# Patient Record
Sex: Female | Born: 1945
Health system: Southern US, Community
[De-identification: ages and names within clinical notes are randomized; demographics above are authoritative.]

## PROBLEM LIST (undated history)

## (undated) DIAGNOSIS — I219 Acute myocardial infarction, unspecified: Secondary | ICD-10-CM

## (undated) DIAGNOSIS — E785 Hyperlipidemia, unspecified: Secondary | ICD-10-CM

## (undated) DIAGNOSIS — Z951 Presence of aortocoronary bypass graft: Secondary | ICD-10-CM

## (undated) DIAGNOSIS — R011 Cardiac murmur, unspecified: Secondary | ICD-10-CM

## (undated) DIAGNOSIS — I2510 Atherosclerotic heart disease of native coronary artery without angina pectoris: Secondary | ICD-10-CM

## (undated) DIAGNOSIS — M1990 Unspecified osteoarthritis, unspecified site: Secondary | ICD-10-CM

## (undated) DIAGNOSIS — E039 Hypothyroidism, unspecified: Secondary | ICD-10-CM

## (undated) DIAGNOSIS — I509 Heart failure, unspecified: Secondary | ICD-10-CM

## (undated) DIAGNOSIS — N814 Uterovaginal prolapse, unspecified: Secondary | ICD-10-CM

## (undated) DIAGNOSIS — N816 Rectocele: Secondary | ICD-10-CM

## (undated) DIAGNOSIS — I10 Essential (primary) hypertension: Secondary | ICD-10-CM

## (undated) HISTORY — DX: Rectocele: N81.6

## (undated) HISTORY — DX: Acute myocardial infarction, unspecified: I21.9

## (undated) HISTORY — DX: Hyperlipidemia, unspecified: E78.5

## (undated) HISTORY — DX: Heart failure, unspecified: I50.9

## (undated) HISTORY — PX: OTHER SURGICAL HISTORY: SHX169

## (undated) HISTORY — PX: TONSILLECTOMY: SUR1361

## (undated) HISTORY — DX: Uterovaginal prolapse, unspecified: N81.4

## (undated) HISTORY — DX: Presence of aortocoronary bypass graft: Z95.1

## (undated) HISTORY — PX: JOINT REPLACEMENT: SHX530

## (undated) HISTORY — PX: TUBAL LIGATION: SHX77

## (undated) HISTORY — PX: CORONARY ARTERY BYPASS GRAFT: SHX141

## (undated) HISTORY — PX: CARDIAC VALVE REPLACEMENT: SHX585

---

## 2013-08-27 HISTORY — PX: CARDIAC CATHETERIZATION: SHX172

## 2013-09-13 HISTORY — PX: CORONARY ARTERY BYPASS GRAFT: SHX141

## 2013-09-13 HISTORY — PX: CARDIAC VALVE REPLACEMENT: SHX585

## 2013-10-02 DIAGNOSIS — Q2381 Bicuspid aortic valve: Secondary | ICD-10-CM

## 2013-10-02 HISTORY — DX: Bicuspid aortic valve: Q23.81

## 2013-10-05 DIAGNOSIS — J9691 Respiratory failure, unspecified with hypoxia: Secondary | ICD-10-CM | POA: Insufficient documentation

## 2013-11-10 DIAGNOSIS — I10 Essential (primary) hypertension: Secondary | ICD-10-CM | POA: Insufficient documentation

## 2019-03-26 DIAGNOSIS — N816 Rectocele: Secondary | ICD-10-CM | POA: Insufficient documentation

## 2019-03-26 DIAGNOSIS — N814 Uterovaginal prolapse, unspecified: Secondary | ICD-10-CM | POA: Insufficient documentation

## 2019-03-26 DIAGNOSIS — N3281 Overactive bladder: Secondary | ICD-10-CM | POA: Insufficient documentation

## 2019-03-26 DIAGNOSIS — N950 Postmenopausal bleeding: Secondary | ICD-10-CM | POA: Insufficient documentation

## 2019-03-26 DIAGNOSIS — N8111 Cystocele, midline: Secondary | ICD-10-CM | POA: Insufficient documentation

## 2020-03-19 ENCOUNTER — Other Ambulatory Visit: Payer: Self-pay

## 2020-03-19 ENCOUNTER — Emergency Department
Admission: EM | Admit: 2020-03-19 | Discharge: 2020-03-19 | Disposition: A | Discharge status: Home / Self Care | Payer: Self-pay | Source: Home / Self Care

## 2020-03-19 DIAGNOSIS — I10 Essential (primary) hypertension: Secondary | ICD-10-CM

## 2020-03-19 DIAGNOSIS — R519 Headache, unspecified: Secondary | ICD-10-CM

## 2020-03-19 MED ORDER — AMLODIPINE BESYLATE 5 MG PO TABS: 5.0000 mg | ORAL_TABLET | Freq: Every day | ORAL | 0 refills | Status: DC

## 2020-03-19 NOTE — ED Triage Notes (Signed)
Patient presents to Urgent Care with complaints of headache intermittently since a week ago. Patient reports her blood pressure was high earlier today... 187/84, 175/85 at home. Pt thinks maybe it is her blood pressure making her head hurt, had a stress test last year and she passed (had it for surgery).

## 2020-03-19 NOTE — ED Provider Notes (Signed)
[facility]    CSN: [id number] Arrival date & time: 03/19/20  1849      History   Chief Complaint Chief Complaint  Patient presents with  . Headache    HPI Alicia Herrera is a 74 y.o. female.   HPI  Alicia Herrera is a 74 y.o. female presenting to UC with c/o intermittent HA that started 1 week ago.  Pain is 3/10, generalized HA right now. She reports taking her BP today, it was 187/84 and 175/85 at home.  Pt has a hx of HTN per her medical records but denies being on any BP medication.  Amlodipine and metoprolol are listed in the pt's chart via [facility] as recent as 05/2019.  Pt does not recall the last time she took this medication.  Pt reports completing a stress test last year with her cardiologist and passed.  She does note her husband died recently and has not checked her BP in months until today.  She does not have a PCP.  Denies dizziness, nausea, weakness, or numbness in arms or legs. No cough, congestion, sore throat or other URI symptoms.  No hx of migraines.   History reviewed. No pertinent past medical history.  There are no problems to display for this patient.   History reviewed. No pertinent surgical history.  OB History   No obstetric history on file.      Home Medications    Prior to Admission medications   Medication Sig Start Date End Date Taking? Authorizing Provider  rosuvastatin (CRESTOR) 10 MG tablet Take 20 mg by mouth daily.    Yes [provider]  amLODipine (NORVASC) 5 MG tablet Take 1 tablet (5 mg total) by mouth daily. 03/19/20   Lurene Shadow, PA-C    Family History Family History  Problem Relation Age of Onset  . Cancer Father     Social History Social History   Tobacco Use  . Smoking status: Never Smoker  . Smokeless tobacco: Never Used  Substance Use Topics  . Alcohol use: Not Currently  . Drug use: Not on file     Allergies   Patient has no known allergies.   Review of Systems Review of  Systems  Constitutional: Negative for chills and fever.  HENT: Negative for congestion, ear pain, sore throat, trouble swallowing and voice change.   Respiratory: Negative for cough and shortness of breath.   Cardiovascular: Negative for chest pain and palpitations.  Gastrointestinal: Negative for abdominal pain, diarrhea, nausea and vomiting.  Musculoskeletal: Negative for arthralgias, back pain and myalgias.  Skin: Negative for rash.  Neurological: Positive for headaches. Negative for dizziness, facial asymmetry, speech difficulty and light-headedness.  All other systems reviewed and are negative.    Physical Exam Triage Vital Signs ED Triage Vitals  Enc Vitals Group     BP 03/19/20 1911 (!) 152/103     Pulse Rate 03/19/20 1911 91     Resp 03/19/20 1911 16     Temp 03/19/20 1911 98.3 F (36.8 C)     Temp src --      SpO2 03/19/20 1911 100 %     Weight --      Height --      Head Circumference --      Peak Flow --      Pain Score 03/19/20 1907 3     Pain Loc --      Pain Edu? --      Excl. in GC? --  No data found.  Updated Vital Signs BP (!) 152/103 (BP Location: Left Arm)   Pulse 91   Temp 98.3 F (36.8 C)   Resp 16   SpO2 100%   Visual Acuity Right Eye Distance:   Left Eye Distance:   Bilateral Distance:    Right Eye Near:   Left Eye Near:    Bilateral Near:     Physical Exam Vitals and nursing note reviewed.  Constitutional:      Appearance: She is well-developed.  HENT:     Head: Normocephalic and atraumatic.     Right Ear: Tympanic membrane and ear canal normal.     Left Ear: Tympanic membrane and ear canal normal.     Nose: Nose normal.     Mouth/Throat:     Lips: Pink.     Mouth: Mucous membranes are moist.     Pharynx: Oropharynx is clear. Uvula midline.  Eyes:     General: No visual field deficit.    Extraocular Movements: Extraocular movements intact.     Pupils: Pupils are equal, round, and reactive to light.  Cardiovascular:      Rate and Rhythm: Normal rate and regular rhythm.  Pulmonary:     Effort: Pulmonary effort is normal. No respiratory distress.     Breath sounds: Normal breath sounds.  Musculoskeletal:        General: Normal range of motion.     Cervical back: Normal range of motion and neck supple.  Skin:    General: Skin is warm and dry.  Neurological:     Mental Status: She is alert and oriented to person, place, and time.     GCS: GCS eye subscore is 4. GCS verbal subscore is 5. GCS motor subscore is 6.     Cranial Nerves: No cranial nerve deficit, dysarthria or facial asymmetry.     Sensory: No sensory deficit.     Motor: No weakness.     Coordination: Romberg sign negative. Coordination normal.     Gait: Gait normal.  Psychiatric:        Mood and Affect: Mood normal.        Behavior: Behavior normal.      UC Treatments / Results  Labs (all labs ordered are listed, but only abnormal results are displayed) Labs Reviewed - No data to display  EKG   Radiology No results found.  Procedures Procedures (including critical care time)  Medications Ordered in UC Medications - No data to display  Initial Impression / Assessment and Plan / UC Course  I have reviewed the triage vital signs and the nursing notes.  Pertinent labs & imaging results that were available during my care of the patient were reviewed by me and considered in my medical decision making (see chart for details).     Pt appears well, NAD.  Doubt SAH or CVA Reviewed medical records, pt was at one point prescribed amlodipine 5mg  daily and metoprolol 25mg  daily (last listed in her cardiology records 05/26/2019) but pt does not recall when she was last prescribed these medications. Will restart pt on amlodipine 5mg  first Encouraged f/u with cardiology and PCP, she may need the metoprolol as well, do not want to lower BP too aggressively in the urgent care setting.  Discussed symptoms that warrant emergent care in the  ED. AVS provided  Final Clinical Impressions(s) / UC Diagnoses   Final diagnoses:  Bad headache  Uncontrolled hypertension     Discharge Instructions  It looks like you do have a history of high blood pressure. Your medical records indicate you were prescribed amlodipine 5mg  once daily and metorpolol 25mg  daily to help with your blood pressure. Because you have been off of both blood pressure medications for an unknown amount of time, I am just starting you on the low dose amlodipine today. It is highly recommended you establish care with a primary care provider (family doctor) for ongoing management of your blood pressure. You may need to be restarted on the second medication as well. It is also recommended you follow up with your cardiologist to discuss these medications.  Call 911 or go to the hospital if your headache worsens, you develop vomiting, change in vision, confusion, one sided weakness or numbness, or any other new concerning symptoms develop.     ED Prescriptions    Medication Sig Dispense Auth. Provider   amLODipine (NORVASC) 5 MG tablet  (Status: Discontinued) Take 1 tablet (5 mg total) by mouth daily. 30 tablet Gerarda Fraction, Melisia Leming O, PA-C   amLODipine (NORVASC) 5 MG tablet Take 1 tablet (5 mg total) by mouth daily. 30 tablet Noe Gens, [place]     PDMP not reviewed this encounter.   Noe Gens, [place] 03/22/20 [phone]

## 2020-03-19 NOTE — Discharge Instructions (Addendum)
  It looks like you do have a history of high blood pressure. Your medical records indicate you were prescribed amlodipine 5mg  once daily and metorpolol 25mg  daily to help with your blood pressure. Because you have been off of both blood pressure medications for an unknown amount of time, I am just starting you on the low dose amlodipine today. It is highly recommended you establish care with a primary care provider (family doctor) for ongoing management of your blood pressure. You may need to be restarted on the second medication as well. It is also recommended you follow up with your cardiologist to discuss these medications.  Call 911 or go to the hospital if your headache worsens, you develop vomiting, change in vision, confusion, one sided weakness or numbness, or any other new concerning symptoms develop.

## 2020-04-06 ENCOUNTER — Ambulatory Visit (INDEPENDENT_AMBULATORY_CARE_PROVIDER_SITE_OTHER): Payer: Self-pay | Admitting: Family Medicine

## 2020-04-06 ENCOUNTER — Encounter: Payer: Self-pay | Admitting: Family Medicine

## 2020-04-06 VITALS — BP 153/75 | HR 74 | Temp 98.0°F | Ht 67.0 in | Wt 184.5 lb

## 2020-04-06 DIAGNOSIS — I25810 Atherosclerosis of coronary artery bypass graft(s) without angina pectoris: Secondary | ICD-10-CM

## 2020-04-06 DIAGNOSIS — Z951 Presence of aortocoronary bypass graft: Secondary | ICD-10-CM

## 2020-04-06 DIAGNOSIS — I10 Essential (primary) hypertension: Secondary | ICD-10-CM

## 2020-04-06 DIAGNOSIS — E782 Mixed hyperlipidemia: Secondary | ICD-10-CM

## 2020-04-06 MED ORDER — ATORVASTATIN CALCIUM 20 MG PO TABS: 20.0000 mg | ORAL_TABLET | Freq: Every day | ORAL | 3 refills | Status: DC

## 2020-04-06 MED ORDER — AMLODIPINE BESYLATE 10 MG PO TABS: 10.0000 mg | ORAL_TABLET | Freq: Every day | ORAL | 2 refills | Status: DC

## 2020-04-06 NOTE — Progress Notes (Signed)
Alicia Herrera - 73 y.o. female MRN [id number]  Date of birth: 1946-02-01  Subjective Chief Complaint  Patient presents with  . Establish Care    HPI Alicia Herrera is a 74 y.o. female with history of CAD s/p CABG, aortic stenosis s/p bioprosthetic valve, HTN and HLD here today for initial visit.    -CAD/AS:  Had AV replacement with CABG in 2014.  She has done well since that time.  She follows with cardiology annually.  She denies anginal symptoms, dizziness or shortness of breath. She is taking daily full strength asa.   -HTN:  She has been prescribed amlodipine and metoprolol previously but does not believe that she ever took either of these.  She was seen at urgent care a couple of weeks ago for elevated BP and headache.  Amlodipine 5mg  started at that time.  BP and headaches have improved but still running a little high.    -HLD:  She was prescribed crestor but unable to tolerate due to myalgias.  She has not been on anything else in the past.   ROS:  A comprehensive ROS was completed and negative except as noted per HPI  No Known Allergies  No past medical history on file.  No past surgical history on file.  Social History   Socioeconomic History  . Marital status: Widowed    Spouse name: Not on file  . Number of children: Not on file  . Years of education: Not on file  . Highest education level: Not on file  Occupational History  . Not on file  Tobacco Use  . Smoking status: Never Smoker  . Smokeless tobacco: Never Used  Substance and Sexual Activity  . Alcohol use: Not Currently  . Drug use: Not on file  . Sexual activity: Not on file  Other Topics Concern  . Not on file  Social History Narrative  . Not on file   Social Determinants of Health   Financial Resource Strain:   . Difficulty of Paying Living Expenses:   Food Insecurity:   . Worried About in the Last Year:   . Programme researcher, broadcasting/film/video in the Last Year:   Transportation Needs:   . Barista (Medical):   Sales promotion account executive Lack of Transportation (Non-Medical):   Physical Activity:   . Days of Exercise per Week:   . Minutes of Exercise per Session:   Stress:   . Feeling of Stress :   Social Connections:   . Frequency of Communication with Friends and Family:   . Frequency of Social Gatherings with Friends and Family:   . Attends Religious Services:   . Active Member of Clubs or Organizations:   . Attends [employer] Meetings:   Banker Marital Status:     Family History  Problem Relation Age of Onset  . Cancer Father     Health Maintenance  Topic Date Due  . Hepatitis C Screening  Never done  . COVID-19 Vaccine (1) Never done  . TETANUS/TDAP  Never done  . COLONOSCOPY  Never done  . DEXA SCAN  Never done  . PNA vac Low Risk Adult (1 of 2 - PCV13) Never done  . INFLUENZA VACCINE  06/13/2020     ----------------------------------------------------------------------------------------------------------------------------------------------------------------------------------------------------------------- Physical Exam There were no vitals taken for this visit.  Physical Exam Constitutional:      Appearance: Normal appearance.  HENT:     Head: Normocephalic and atraumatic.  Eyes:  General: No scleral icterus. Cardiovascular:     Rate and Rhythm: Normal rate and regular rhythm.  Pulmonary:     Effort: Pulmonary effort is normal.     Breath sounds: Normal breath sounds.  Musculoskeletal:     Cervical back: Neck supple.  Neurological:     General: No focal deficit present.     Mental Status: She is alert.  Psychiatric:        Mood and Affect: Mood normal.        Behavior: Behavior normal.     ------------------------------------------------------------------------------------------------------------------------------------------------------------------------------------------------------------------- Assessment and Plan  Essential  hypertension Blood pressure is not at goal at for age and co-morbidities.  I recommend increase of amlodipine to 10mg  daily.  In addition they were instructed to follow a low sodium diet with regular exercise to help to maintain adequate control of blood pressure.    Hyperlipidemia She hasn't tolerated crestor well Change to lipitor to see if this is better tolerated.   Updated lipid and CMP ordered.   S/P CABG (coronary artery bypass graft) Stable at this time without anginal symptoms.  Continue daily ASA Keep bp under good control.  Has f/u with cardiology in July.    Meds ordered this encounter  Medications  . amLODipine (NORVASC) 10 MG tablet    Sig: Take 1 tablet (10 mg total) by mouth daily.    Dispense:  90 tablet    Refill:  2  . atorvastatin (LIPITOR) 20 MG tablet    Sig: Take 1 tablet (20 mg total) by mouth daily.    Dispense:  90 tablet    Refill:  3    Return in about 6 weeks (around 05/18/2020).    This visit occurred during the SARS-CoV-2 public health emergency.  Safety protocols were in place, including screening questions prior to the visit, additional usage of staff PPE, and extensive cleaning of exam room while observing appropriate contact time as indicated for disinfecting solutions.

## 2020-04-06 NOTE — Assessment & Plan Note (Signed)
Stable at this time without anginal symptoms.  Continue daily ASA Keep bp under good control.  Has f/u with cardiology in July.

## 2020-04-06 NOTE — Assessment & Plan Note (Signed)
She hasn't tolerated crestor well Change to lipitor to see if this is better tolerated.   Updated lipid and CMP ordered.

## 2020-04-06 NOTE — Patient Instructions (Addendum)
Great to meet you today! Lets try lipitor (Atorvastatin) to replace crestor (rosuvastatin) Increase amlodipine to 10mg  daily and continue to monitor BP at home.  See me again in about 6 weeks for blood pressure follow up.

## 2020-04-06 NOTE — Assessment & Plan Note (Signed)
Blood pressure is not at goal at for age and co-morbidities.  I recommend increase of amlodipine to 10mg daily.  In addition they were instructed to follow a low sodium diet with regular exercise to help to maintain adequate control of blood pressure.   

## 2020-05-14 LAB — COMPLETE METABOLIC PANEL WITH GFR
AG Ratio: 1.7 (calc) (ref 1.0–2.5)
ALT: 15 U/L (ref 6–29)
AST: 14 U/L (ref 10–35)
Albumin: 4.3 g/dL (ref 3.6–5.1)
Alkaline phosphatase (APISO): 61 U/L (ref 37–153)
BUN: 16 mg/dL (ref 7–25)
CO2: 28 mmol/L (ref 20–32)
Calcium: 9.2 mg/dL (ref 8.6–10.4)
Chloride: 105 mmol/L (ref 98–110)
Creat: 0.72 mg/dL (ref 0.60–0.93)
GFR, Est African American: 95 mL/min/{1.73_m2} (ref >=60)
GFR, Est Non African American: 82 mL/min/{1.73_m2} (ref >=60)
Globulin: 2.5 g/dL (calc) (ref 1.9–3.7)
Glucose, Bld: 97 mg/dL (ref 65–99)
Potassium: 4 mmol/L (ref 3.5–5.3)
Sodium: 140 mmol/L (ref 135–146)
Total Bilirubin: 0.4 mg/dL (ref 0.2–1.2)
Total Protein: 6.8 g/dL (ref 6.1–8.1)

## 2020-05-14 LAB — LIPID PANEL
Cholesterol: 199 mg/dL (ref <200)
HDL: 45 mg/dL — ABNORMAL LOW (ref >=50)
LDL Cholesterol (Calc): 136 mg/dL (calc) — ABNORMAL HIGH
Non-HDL Cholesterol (Calc): 154 mg/dL (calc) — ABNORMAL HIGH (ref <130)
Total CHOL/HDL Ratio: 4.4 (calc) (ref <5.0)
Triglycerides: 85 mg/dL (ref <150)

## 2020-05-14 LAB — CBC
HCT: 36.8 % (ref 35.0–45.0)
Hemoglobin: 12.3 g/dL (ref 11.7–15.5)
MCH: 27.4 pg (ref 27.0–33.0)
MCHC: 33.4 g/dL (ref 32.0–36.0)
MCV: 82 fL (ref 80.0–100.0)
MPV: 9.4 fL (ref 7.5–12.5)
Platelets: 248 10*3/uL (ref 140–400)
RBC: 4.49 10*6/uL (ref 3.80–5.10)
RDW: 14.3 % (ref 11.0–15.0)
WBC: 4.5 10*3/uL (ref 3.8–10.8)

## 2020-05-18 ENCOUNTER — Ambulatory Visit (INDEPENDENT_AMBULATORY_CARE_PROVIDER_SITE_OTHER): Payer: Medicare Other

## 2020-05-18 ENCOUNTER — Ambulatory Visit (INDEPENDENT_AMBULATORY_CARE_PROVIDER_SITE_OTHER): Payer: Medicare Other | Admitting: Family Medicine

## 2020-05-18 ENCOUNTER — Encounter: Payer: Self-pay | Admitting: Family Medicine

## 2020-05-18 ENCOUNTER — Other Ambulatory Visit: Payer: Self-pay

## 2020-05-18 VITALS — BP 129/64 | HR 73 | Ht 66.93 in | Wt 183.2 lb

## 2020-05-18 DIAGNOSIS — G8929 Other chronic pain: Secondary | ICD-10-CM

## 2020-05-18 DIAGNOSIS — M25562 Pain in left knee: Secondary | ICD-10-CM | POA: Insufficient documentation

## 2020-05-18 DIAGNOSIS — E782 Mixed hyperlipidemia: Secondary | ICD-10-CM | POA: Diagnosis not present

## 2020-05-18 DIAGNOSIS — I10 Essential (primary) hypertension: Secondary | ICD-10-CM

## 2020-05-18 DIAGNOSIS — M25561 Pain in right knee: Secondary | ICD-10-CM

## 2020-05-18 DIAGNOSIS — M170 Bilateral primary osteoarthritis of knee: Secondary | ICD-10-CM | POA: Insufficient documentation

## 2020-05-18 NOTE — Assessment & Plan Note (Signed)
Tolerating atorvastatin, f/u lipids in ~6 months.

## 2020-05-18 NOTE — Patient Instructions (Signed)
Nice to see you today! Continue voltaren gel to knees.  You may use this up to 4 times per day Have xrays completed, we'll be in touch with results.  Follow up with me in 6 months.

## 2020-05-18 NOTE — Progress Notes (Addendum)
Alicia Herrera - 74 y.o. female MRN [id number]  Date of birth: 07-31-1946  Subjective Chief Complaint  Patient presents with  . Hypertension  . Knee Pain    HPI Alicia Herrera is a 74 y.o. female with history of CAD s/p CABG, HTN, and HLD here today for follow up visit.   At her most recent visit she was started on amlodipine 10mg  daily.  She is doing well with this.  Home BP readings have trended down since starting this.  BP today is well controlled.  She denies any symptoms related to HTN including chest pain, shortness of breath, palpitations, headache or vision changes.   She is also tolerating atorvastatin well.  Denies myalgias with this.   She is having some pain in bilateral knees. This is chronic with some worsening recently.  The is swelling in medial knees bilaterally.  Some pain behind knee caps as well.  She denies locking, popping or feeling of instability.  She was seen by chiropractor previously who told her that her knee pain was coming from her back.   ROS:  A comprehensive ROS was completed and negative except as noted per HPI  No Known Allergies  Past Medical History:  Diagnosis Date  . Hx of CABG   . Rectocele   . Uterine prolapse     Past Surgical History:  Procedure Laterality Date  . CARDIAC VALVE REPLACEMENT    . CORONARY ARTERY BYPASS GRAFT      Social History   Socioeconomic History  . Marital status: Widowed    Spouse name: Not on file  . Number of children: Not on file  . Years of education: Not on file  . Highest education level: Not on file  Occupational History  . Occupation: Retired  Tobacco Use  . Smoking status: Never Smoker  . Smokeless tobacco: Never Used  Vaping Use  . Vaping Use: Never used  Substance and Sexual Activity  . Alcohol use: Yes    Alcohol/week: 1.0 standard drink    Types: 1 Glasses of wine per week    Comment: Occasionally  . Drug use: Never  . Sexual activity: Not Currently    Birth control/protection:  Abstinence  Other Topics Concern  . Not on file  Social History Narrative  . Not on file   Social Determinants of Health   Financial Resource Strain:   . Difficulty of Paying Living Expenses:   Food Insecurity:   . Worried About in the Last Year:   . Programme researcher, broadcasting/film/video in the Last Year:   Transportation Needs:   . Barista (Medical):   Freight forwarder Lack of Transportation (Non-Medical):   Physical Activity:   . Days of Exercise per Week:   . Minutes of Exercise per Session:   Stress:   . Feeling of Stress :   Social Connections:   . Frequency of Communication with Friends and Family:   . Frequency of Social Gatherings with Friends and Family:   . Attends Religious Services:   . Active Member of Clubs or Organizations:   . Attends [employer] Meetings:   Banker Marital Status:     Family History  Problem Relation Age of Onset  . Hypertension Mother   . Heart attack Mother   . Cancer Father   . Penile cancer Father   . Diabetes Sister     Health Maintenance  Topic Date Due  . Hepatitis C Screening  Never done  .  TETANUS/TDAP  Never done  . COLONOSCOPY  Never done  . DEXA SCAN  Never done  . PNA vac Low Risk Adult (1 of 2 - PCV13) Never done  . INFLUENZA VACCINE  06/13/2020  . COVID-19 Vaccine  Completed     ----------------------------------------------------------------------------------------------------------------------------------------------------------------------------------------------------------------- Physical Exam BP 129/64 (BP Location: Left Arm, Patient Position: Sitting, Cuff Size: Large)   Pulse 73   Ht 5' 6.93" (1.7 m)   Wt 183 lb 3.2 oz (83.1 kg)   SpO2 97%   BMI 28.75 kg/m   Physical Exam Constitutional:      Appearance: Normal appearance.  HENT:     Head: Normocephalic and atraumatic.  Cardiovascular:     Rate and Rhythm: Normal rate and regular rhythm.  Pulmonary:     Effort: Pulmonary effort is normal.      Breath sounds: Normal breath sounds.  Musculoskeletal:     Cervical back: Neck supple.     Comments: Bilateral knees with swelling medially. There is some tenderness over the medial joint line as well.  Pain with patellar compression and some crepitus.    Skin:    General: Skin is warm and dry.  Neurological:     General: No focal deficit present.     Mental Status: She is alert.  Psychiatric:        Mood and Affect: Mood normal.        Behavior: Behavior normal.     ------------------------------------------------------------------------------------------------------------------------------------------------------------------------------------------------------------------- Assessment and Plan  Essential hypertension Blood pressure is at goal at for age and co-morbidities.  I recommend she continue amlodipine at current strength.  In addition they were instructed to follow a low sodium diet with regular exercise to help to maintain adequate control of blood pressure.    Hyperlipidemia Tolerating atorvastatin, f/u lipids in ~6 months.   Chronic pain of both knees Suspect medial compartment OA Bilateral knee pain and swelling. Xrays ordered today for evaluation of joint spaces.    No orders of the defined types were placed in this encounter.   Return in about 6 months (around 11/18/2020) for HTN/HLD.    This visit occurred during the SARS-CoV-2 public health emergency.  Safety protocols were in place, including screening questions prior to the visit, additional usage of staff PPE, and extensive cleaning of exam room while observing appropriate contact time as indicated for disinfecting solutions.

## 2020-05-18 NOTE — Assessment & Plan Note (Signed)
Blood pressure is at goal at for age and co-morbidities.  I recommend she continue amlodipine at current strength.  In addition they were instructed to follow a low sodium diet with regular exercise to help to maintain adequate control of blood pressure.

## 2020-05-18 NOTE — Assessment & Plan Note (Addendum)
Suspect medial compartment OA Bilateral knee pain and swelling. Xrays ordered today for evaluation of joint spaces.

## 2020-05-20 ENCOUNTER — Other Ambulatory Visit: Payer: Self-pay | Admitting: Family Medicine

## 2020-05-20 MED ORDER — MELOXICAM 7.5 MG PO TABS
7.5000 mg | ORAL_TABLET | Freq: Two times a day (BID) | ORAL | 1 refills | Status: DC | PRN
Start: 2020-05-20 — End: 2020-11-18

## 2020-11-18 ENCOUNTER — Other Ambulatory Visit: Payer: Self-pay

## 2020-11-18 ENCOUNTER — Encounter: Payer: Self-pay | Admitting: Family Medicine

## 2020-11-18 ENCOUNTER — Ambulatory Visit (INDEPENDENT_AMBULATORY_CARE_PROVIDER_SITE_OTHER): Payer: Medicare HMO | Admitting: Family Medicine

## 2020-11-18 VITALS — BP 113/70 | HR 77 | Ht 66.93 in | Wt 182.0 lb

## 2020-11-18 DIAGNOSIS — I10 Essential (primary) hypertension: Secondary | ICD-10-CM | POA: Diagnosis not present

## 2020-11-18 DIAGNOSIS — M25561 Pain in right knee: Secondary | ICD-10-CM

## 2020-11-18 DIAGNOSIS — G8929 Other chronic pain: Secondary | ICD-10-CM | POA: Diagnosis not present

## 2020-11-18 DIAGNOSIS — E782 Mixed hyperlipidemia: Secondary | ICD-10-CM | POA: Diagnosis not present

## 2020-11-18 DIAGNOSIS — Z951 Presence of aortocoronary bypass graft: Secondary | ICD-10-CM

## 2020-11-18 DIAGNOSIS — M25562 Pain in left knee: Secondary | ICD-10-CM | POA: Diagnosis not present

## 2020-11-18 MED ORDER — AMLODIPINE BESYLATE 10 MG PO TABS: 10.0000 mg | ORAL_TABLET | Freq: Every day | ORAL | 2 refills | Status: DC

## 2020-11-18 NOTE — Progress Notes (Signed)
Alicia Herrera - 75 y.o. female MRN [id number]  Date of birth: 1946/11/08  Subjective No chief complaint on file.   HPI Alicia Herrera is a 75 y.o. female with history of HTN, HLD, CAD here today for follow up visit.  She reports that she is doing well and has no new concerns today.  She has had COVID vaccine, has not had booster yet.   She is doing well with amlodipine.  She denies side effects related to medication.  She continues to see cardiology for her history of CABG and ascending aorta repair.    HLD is managed with atorvastatin.  She was taking crestor however we changed to atorvastatin because she was having some aches in her legs.  She reports that changing this didn't seem to make any difference.  Her cardiologist suggested that she increase the atorvastatin to 40mg  as her previous LDL was 136.  She did not do this because she had not been taking statin consistently and wanted to see if taking medication consistently would get her to goal before increasing.    ROS:  A comprehensive ROS was completed and negative except as noted per HPI  No Known Allergies  Past Medical History:  Diagnosis Date  . Hx of CABG   . Rectocele   . Uterine prolapse     Past Surgical History:  Procedure Laterality Date  . CARDIAC VALVE REPLACEMENT    . CORONARY ARTERY BYPASS GRAFT      Social History   Socioeconomic History  . Marital status: Widowed    Spouse name: Not on file  . Number of children: Not on file  . Years of education: Not on file  . Highest education level: Not on file  Occupational History  . Occupation: Retired  Tobacco Use  . Smoking status: Never Smoker  . Smokeless tobacco: Never Used  Vaping Use  . Vaping Use: Never used  Substance and Sexual Activity  . Alcohol use: Yes    Alcohol/week: 1.0 standard drink    Types: 1 Glasses of wine per week    Comment: Occasionally  . Drug use: Never  . Sexual activity: Not Currently    Birth control/protection:  Abstinence  Other Topics Concern  . Not on file  Social History Narrative  . Not on file   Social Determinants of Health   Financial Resource Strain: Not on file  Food Insecurity: Not on file  Transportation Needs: Not on file  Physical Activity: Not on file  Stress: Not on file  Social Connections: Not on file    Family History  Problem Relation Age of Onset  . Hypertension Mother   . Heart attack Mother   . Cancer Father   . Penile cancer Father   . Diabetes Sister     Health Maintenance  Topic Date Due  . Hepatitis C Screening  Never done  . TETANUS/TDAP  Never done  . COLONOSCOPY (Pts 45-22yrs Insurance coverage will need to be confirmed)  Never done  . DEXA SCAN  Never done  . PNA vac Low Risk Adult (1 of 2 - PCV13) Never done  . INFLUENZA VACCINE  Never done  . COVID-19 Vaccine (3 - Booster for Pfizer series) 08/14/2020     ----------------------------------------------------------------------------------------------------------------------------------------------------------------------------------------------------------------- Physical Exam BP 113/70   Pulse 77   Ht 5' 6.93" (1.7 m)   Wt 182 lb (82.6 kg)   BMI 28.56 kg/m   Physical Exam Constitutional:      Appearance: Normal appearance.  HENT:     Head: Normocephalic and atraumatic.  Eyes:     General: No scleral icterus. Cardiovascular:     Rate and Rhythm: Normal rate and regular rhythm.  Pulmonary:     Effort: Pulmonary effort is normal.     Breath sounds: Normal breath sounds.  Neurological:     General: No focal deficit present.     Mental Status: She is alert.  Psychiatric:        Mood and Affect: Mood normal.        Behavior: Behavior normal.     ------------------------------------------------------------------------------------------------------------------------------------------------------------------------------------------------------------------- Assessment and  Plan  Essential hypertension Blood pressure is at goal at for age and co-morbidities.  I recommend continuation of amlodipine at current strength.  In addition they were instructed to follow a low sodium diet with regular exercise to help to maintain adequate control of blood pressure.    Hyperlipidemia Goal LDL <70 She has started using statin more consistently and has not really notice improvement in joint/leg pain since changing from crestor.  Checking D-LDL today.  Discussed that if LDL remains elevated would recommend change back to crestor.     S/P CABG (coronary artery bypass graft) Followed by cardiology.  No anginal symptoms at this time.  Continue asa and statin.   Chronic pain of both knees No improvement with change in statin.  Better with chiropractor treatment.     Meds ordered this encounter  Medications  . amLODipine (NORVASC) 10 MG tablet    Sig: Take 1 tablet (10 mg total) by mouth daily.    Dispense:  90 tablet    Refill:  2    Return in about 6 months (around 05/18/2021) for HTN/HLD.    This visit occurred during the SARS-CoV-2 public health emergency.  Safety protocols were in place, including screening questions prior to the visit, additional usage of staff PPE, and extensive cleaning of exam room while observing appropriate contact time as indicated for disinfecting solutions.

## 2020-11-18 NOTE — Assessment & Plan Note (Signed)
Goal LDL <70 She has started using statin more consistently and has not really notice improvement in joint/leg pain since changing from crestor.  Checking D-LDL today.  Discussed that if LDL remains elevated would recommend change back to crestor.

## 2020-11-18 NOTE — Assessment & Plan Note (Signed)
Followed by cardiology.  No anginal symptoms at this time.  Continue asa and statin.

## 2020-11-18 NOTE — Assessment & Plan Note (Signed)
Blood pressure is at goal at for age and co-morbidities.  I recommend continuation of amlodipine at current strength.  In addition they were instructed to follow a low sodium diet with regular exercise to help to maintain adequate control of blood pressure.   

## 2020-11-18 NOTE — Assessment & Plan Note (Signed)
No improvement with change in statin.  Better with chiropractor treatment.

## 2020-11-18 NOTE — Patient Instructions (Signed)
Nice to see you today! Please continue amlodipine.  Have labs completed today.  See me again in 6 months.

## 2020-11-19 ENCOUNTER — Other Ambulatory Visit: Payer: Self-pay | Admitting: Family Medicine

## 2020-11-19 LAB — LDL CHOLESTEROL, DIRECT: Direct LDL: 174 mg/dL — ABNORMAL HIGH (ref <100)

## 2020-11-19 MED ORDER — ROSUVASTATIN CALCIUM 20 MG PO TABS
20.0000 mg | ORAL_TABLET | Freq: Every day | ORAL | 3 refills | Status: DC
Start: 2020-11-19 — End: 2021-05-18

## 2020-11-19 NOTE — Progress Notes (Signed)
LDL is quite high, recommend change back to crestor 20mg  daily.  Recheck in 6 months.  Updated rx sent in.

## 2021-01-17 ENCOUNTER — Telehealth: Payer: Medicare HMO

## 2021-01-17 ENCOUNTER — Ambulatory Visit (INDEPENDENT_AMBULATORY_CARE_PROVIDER_SITE_OTHER): Payer: Medicare HMO | Admitting: Family Medicine

## 2021-01-17 DIAGNOSIS — Z0000 Encounter for general adult medical examination without abnormal findings: Secondary | ICD-10-CM

## 2021-01-17 NOTE — Patient Instructions (Addendum)
Bone Density Test A bone density test uses a type of X-ray to measure the amount of calcium and other minerals in a person's bones. It can measure bone density in the hip and the spine. The test is similar to having a regular X-ray. This test may also be called:  Bone densitometry.  Bone mineral density test.  Dual-energy X-ray absorptiometry (DEXA). You may have this test to:  Diagnose a condition that causes weak or thin bones (osteoporosis).  Screen you for osteoporosis.  Predict your risk for a broken bone (fracture).  Determine how well your osteoporosis treatment is working. Tell a health care provider about:  Any allergies you have.  All medicines you are taking, including vitamins, herbs, eye drops, creams, and over-the-counter medicines.  Any problems you or family members have had with anesthetic medicines.  Any blood disorders you have.  Any surgeries you have had.  Any medical conditions you have.  Whether you are pregnant or may be pregnant.  Any medical tests you have had within the past 14 days that used contrast material. What are the risks? Generally, this is a safe test. However, it does expose you to a small amount of radiation, which can slightly increase your cancer risk. What happens before the test?  Do not take any calcium supplements within the 24 hours before your test.  You will need to remove all metal jewelry, eyeglasses, removable dental appliances, and any other metal objects on your body. What happens during the test?  You will lie down on an exam table. There will be an X-ray generator below you and an imaging device above you.  Other devices, such as boxes or braces, may be used to position your body properly for the scan.  The machine will slowly scan your body. You will need to keep very still while the machine does the scan.  The images will show up on a screen in the room. Images will be examined by a specialist after your  test is finished. The procedure may vary among health care providers and hospitals.   What can I expect after the test? It is up to you to get the results of your test. Ask your health care provider, or the department that is doing the test, when your results will be ready. Summary  A bone density test is an imaging test that uses a type of X-ray to measure the amount of calcium and other minerals in your bones.  The test may be used to diagnose or screen you for a condition that causes weak or thin bones (osteoporosis), predict your risk for a broken bone (fracture), or determine how well your osteoporosis treatment is working.  Do not take any calcium supplements within 24 hours before your test.  Ask your health care provider, or the department that is doing the test, when your results will be ready. This information is not intended to replace advice given to you by your health care provider. Make sure you discuss any questions you have with your health care provider. Document Revised: 04/15/2020 Document Reviewed: 04/15/2020 Elsevier Patient Education  2021 [employer].   Colonoscopy, Adult A colonoscopy is a procedure to look at the entire large intestine. This procedure is done using a long, thin, flexible tube that has a camera on the end. You may have a colonoscopy:  As a part of normal colorectal screening.  If you have certain symptoms, such as: ? A low number of red blood cells  in your blood (anemia). ? Diarrhea that does not go away. ? Pain in your abdomen. ? Blood in your stool. A colonoscopy can help screen for and diagnose medical problems, including:  Tumors.  Extra tissue that grows where mucus forms (polyps).  Inflammation.  Areas of bleeding. Tell your health care provider about:  Any allergies you have.  All medicines you are taking, including vitamins, herbs, eye drops, creams, and over-the-counter medicines.  Any problems you or family members have  had with anesthetic medicines.  Any blood disorders you have.  Any surgeries you have had.  Any medical conditions you have.  Any problems you have had with having bowel movements.  Whether you are pregnant or may be pregnant. What are the risks? Generally, this is a safe procedure. However, problems may occur, including:  Bleeding.  Damage to your intestine.  Allergic reactions to medicines given during the procedure.  Infection. This is rare. What happens before the procedure? Eating and drinking restrictions Follow instructions from your health care provider about eating or drinking restrictions, which may include:  A few days before the procedure: ? Follow a low-fiber diet. ? Avoid nuts, seeds, dried fruit, raw fruits, and vegetables.  1-3 days before the procedure: ? Eat only gelatin dessert or ice pops. ? Drink only clear liquids, such as water, clear juice, clear broth or bouillon, black coffee or tea, or clear soft drinks or sports drinks. ? Avoid liquids that contain red or purple dye.  The day of the procedure: ? Do not eat solid foods. You may continue to drink clear liquids until up to 2 hours before the procedure. ? Do not eat or drink anything starting 2 hours before the procedure, or within the time period that your health care provider recommends. Bowel prep If you were prescribed a bowel prep to take by mouth (orally) to clean out your colon:  Take it as told by your health care provider. Starting the day before your procedure, you will need to drink a large amount of liquid medicine. The liquid will cause you to have many bowel movements of loose stool until your stool becomes almost clear or light green.  If your skin or the opening between the buttocks (anus) gets irritated from diarrhea, you may relieve the irritation using: ? Wipes with medicine in them, such as adult wet wipes with aloe and vitamin E. ? A product to soothe skin, such as petroleum  jelly.  If you vomit while drinking the bowel prep: ? Take a break for up to 60 minutes. ? Begin the bowel prep again. ? Call your health care provider if you keep vomiting or you cannot take the bowel prep without vomiting.  To clean out your colon, you may also be given: ? Laxative medicines. These help you have a bowel movement. ? Instructions for enema use. An enema is liquid medicine injected into your rectum. Medicines Ask your health care provider about:  Changing or stopping your regular medicines or supplements. This is especially important if you are taking iron supplements, diabetes medicines, or blood thinners.  Taking medicines such as aspirin and ibuprofen. These medicines can thin your blood. Do not take these medicines unless your health care provider tells you to take them.  Taking over-the-counter medicines, vitamins, herbs, and supplements. General instructions  Ask your health care provider what steps will be taken to help prevent infection. These may include washing skin with a germ-killing soap.  Plan to have someone take  you home from the hospital or clinic. What happens during the procedure?  An IV will be inserted into one of your veins.  You may be given one or more of the following: ? A medicine to help you relax (sedative). ? A medicine to numb the area (local anesthetic). ? A medicine to make you fall asleep (general anesthetic). This is rarely needed.  You will lie on your side with your knees bent.  The tube will: ? Have oil or gel put on it (be lubricated). ? Be inserted into your anus. ? Be gently eased through all parts of your large intestine.  Air will be sent into your colon to keep it open. This may cause some pressure or cramping.  Images will be taken with the camera and will appear on a screen.  A small tissue sample may be removed to be looked at under a microscope (biopsy). The tissue may be sent to a lab for testing if any signs  of problems are found.  If small polyps are found, they may be removed and checked for cancer cells.  When the procedure is finished, the tube will be removed. The procedure may vary among health care providers and hospitals.   What happens after the procedure?  Your blood pressure, heart rate, breathing rate, and blood oxygen level will be monitored until you leave the hospital or clinic.  You may have a small amount of blood in your stool.  You may pass gas and have mild cramping or bloating in your abdomen. This is caused by the air that was used to open your colon during the exam.  Do not drive for 24 hours after the procedure.  It is up to you to get the results of your procedure. Ask your health care provider, or the department that is doing the procedure, when your results will be ready. Summary  A colonoscopy is a procedure to look at the entire large intestine.  Follow instructions from your health care provider about eating and drinking before the procedure.  If you were prescribed an oral bowel prep to clean out your colon, take it as told by your health care provider.  During the colonoscopy, a flexible tube with a camera on its end is inserted into the anus and then passed into the other parts of the large intestine. This information is not intended to replace advice given to you by your health care provider. Make sure you discuss any questions you have with your health care provider. Document Revised: 05/23/2019 Document Reviewed: 05/23/2019 Elsevier Patient Education  [place] Maintenance, Female Adopting a healthy lifestyle and getting preventive care are important in promoting health and wellness. Ask your health care provider about:  The right schedule for you to have regular tests and exams.  Things you can do on your own to prevent diseases and keep yourself healthy. What should I know about diet, weight, and exercise? Eat a healthy  diet  Eat a diet that includes plenty of vegetables, fruits, low-fat dairy products, and lean protein.  Do not eat a lot of foods that are high in solid fats, added sugars, or sodium.   Maintain a healthy weight Body mass index (BMI) is used to identify weight problems. It estimates body fat based on height and weight. Your health care provider can help determine your BMI and help you achieve or maintain a healthy weight. Get regular exercise Get regular exercise. This is one of  the most important things you can do for your health. Most adults should:  Exercise for at least 150 minutes each week. The exercise should increase your heart rate and make you sweat (moderate-intensity exercise).  Do strengthening exercises at least twice a week. This is in addition to the moderate-intensity exercise.  Spend less time sitting. Even light physical activity can be beneficial. Watch cholesterol and blood lipids Have your blood tested for lipids and cholesterol at 76 years of age, then have this test every 5 years. Have your cholesterol levels checked more often if:  Your lipid or cholesterol levels are high.  You are older than 76 years of age.  You are at high risk for heart disease. What should I know about cancer screening? Depending on your health history and family history, you may need to have cancer screening at various ages. This may include screening for:  Breast cancer.  Cervical cancer.  Colorectal cancer.  Skin cancer.  Lung cancer. What should I know about heart disease, diabetes, and high blood pressure? Blood pressure and heart disease  High blood pressure causes heart disease and increases the risk of stroke. This is more likely to develop in people who have high blood pressure readings, are of African descent, or are overweight.  Have your blood pressure checked: ? Every 3-5 years if you are 29-95 years of age. ? Every year if you are 22 years old or  older. Diabetes Have regular diabetes screenings. This checks your fasting blood sugar level. Have the screening done:  Once every three years after age 35 if you are at a normal weight and have a low risk for diabetes.  More often and at a younger age if you are overweight or have a high risk for diabetes. What should I know about preventing infection? Hepatitis B If you have a higher risk for hepatitis B, you should be screened for this virus. Talk with your health care provider to find out if you are at risk for hepatitis B infection. Hepatitis C Testing is recommended for:  Everyone born from [birth year] through 1966.  Anyone with known risk factors for hepatitis C. Sexually transmitted infections (STIs)  Get screened for STIs, including gonorrhea and chlamydia, if: ? You are sexually active and are younger than 76 years of age. ? You are older than 76 years of age and your health care provider tells you that you are at risk for this type of infection. ? Your sexual activity has changed since you were last screened, and you are at increased risk for chlamydia or gonorrhea. Ask your health care provider if you are at risk.  Ask your health care provider about whether you are at high risk for HIV. Your health care provider may recommend a prescription medicine to help prevent HIV infection. If you choose to take medicine to prevent HIV, you should first get tested for HIV. You should then be tested every 3 months for as long as you are taking the medicine. Pregnancy  If you are about to stop having your period (premenopausal) and you may become pregnant, seek counseling before you get pregnant.  Take 400 to 800 micrograms (mcg) of folic acid every day if you become pregnant.  Ask for birth control (contraception) if you want to prevent pregnancy. Osteoporosis and menopause Osteoporosis is a disease in which the bones lose minerals and strength with aging. This can result in bone fractures.  If you are 24 years old or older, or  if you are at risk for osteoporosis and fractures, ask your health care provider if you should:  Be screened for bone loss.  Take a calcium or vitamin D supplement to lower your risk of fractures.  Be given hormone replacement therapy (HRT) to treat symptoms of menopause. Follow these instructions at home: Lifestyle  Do not use any products that contain nicotine or tobacco, such as cigarettes, e-cigarettes, and chewing tobacco. If you need help quitting, ask your health care provider.  Do not use street drugs.  Do not share needles.  Ask your health care provider for help if you need support or information about quitting drugs. Alcohol use  Do not drink alcohol if: ? Your health care provider tells you not to drink. ? You are pregnant, may be pregnant, or are planning to become pregnant.  If you drink alcohol: ? Limit how much you use to 0-1 drink a day. ? Limit intake if you are breastfeeding.  Be aware of how much alcohol is in your drink. In the [place] one drink equals one 12 oz bottle of beer (355 mL), one 5 oz glass of wine (148 mL), or one 1 oz glass of hard liquor (44 mL). General instructions  Schedule regular health, dental, and eye exams.  Stay current with your vaccines.  Tell your health care provider if: ? You often feel depressed. ? You have ever been abused or do not feel safe at home. Summary  Adopting a healthy lifestyle and getting preventive care are important in promoting health and wellness.  Follow your health care provider's instructions about healthy diet, exercising, and getting tested or screened for diseases.  Follow your health care provider's instructions on monitoring your cholesterol and blood pressure. This information is not intended to replace advice given to you by your health care provider. Make sure you discuss any questions you have with your health care provider. Document Revised: 10/23/2018  Document Reviewed: 10/23/2018 Elsevier Patient Education  2021 [employer] Inc[employer]  MEDICARE [place] VISIT Health Maintenance Summary and Written Plan of Care  [name] ,  Thank you for allowing me to perform your Medicare Annual Wellness Visit and for your ongoing commitment to your health.   Health Maintenance & Immunization History Health Maintenance  Topic Date Due  . COVID-19 Vaccine (3 - Booster for Pfizer series) 02/02/2021 (Originally 08/14/2020)  . INFLUENZA VACCINE  02/10/2021 (Originally 06/13/2020)  . DEXA SCAN  01/17/2022 (Originally 01/19/2010)  . COLONOSCOPY (Pts 45-22yrs Insurance coverage will need to be confirmed)  01/17/2022 (Originally 01/19/1990)  . TETANUS/TDAP  01/17/2022 (Originally 01/20/1964)  . Hepatitis C Screening  01/17/2022 (Originally [birth year])  . PNA vac Low Risk Adult (1 of 2 - PCV13) 01/17/2022 (Originally 01/19/2010)  . HPV VACCINES  Aged Out   Immunization History  Administered Date(s) Administered  . PFIZER(Purple Top)SARS-COV-2 Vaccination 01/14/2020, 02/13/2020    These are the patient goals that we discussed: Goals Addressed              This Visit's Progress   .  Patient Stated (pt-stated)        01/17/2021 AWV Goal: Improved Nutrition/Diet  . Patient will verbalize understanding that diet plays an important role in overall health and that a poor diet is a risk factor for many chronic medical conditions.  . Over the next year, patient will improve self management of their diet by incorporating more water. . Patient will utilize available community resources to help with food acquisition if needed (ex:  food pantries, [address], etc) . Patient will work with nutrition specialist if a referral was made         This is a list of Health Maintenance Items that are overdue or due now: Pneumococcal vaccine  Influenza vaccine Td vaccine Screening mammography Bone densitometry screening Colorectal cancer screening Shingrix vaccine    Orders/Referrals Placed Today: No orders of the defined types were placed in this encounter.  (Contact our referral department at [phone] if you have not spoken with someone about your referral appointment within the next 5 days)    Follow-up Plan . Follow-up with [name], DO as planned . Please let us know if you change your mind about the DEXA scan.  . Medicare wellness in one year.

## 2021-01-17 NOTE — Progress Notes (Signed)
MEDICARE ANNUAL WELLNESS VISIT  01/17/2021  Telephone Visit Disclaimer This Medicare AWV was conducted by telephone due to national recommendations for restrictions regarding the COVID-19 Pandemic (e.g. social distancing).  I verified, using two identifiers, that I am speaking with Alicia Herrera or their authorized healthcare agent. I discussed the limitations, risks, security, and privacy concerns of performing an evaluation and management service by telephone and the potential availability of an in-person appointment in the future. The patient expressed understanding and agreed to proceed.  Location of Patient: Home Location of Provider (nurse):  In the office.  Subjective:    Alicia Herrera is a 75 y.o. female patient of Alicia Coombe, DO who had a Medicare Annual Wellness Visit today via telephone. Kasondra is Working part time and lives alone. she has 4 children. she reports that she is socially active and does interact with friends/family regularly. she is minimally physically active and enjoys paiting, gardening, sewing, knitting, baking and spending time with the grandchildren.  Patient Care Team: Alicia Coombe, DO as PCP - General (Family Medicine)  Advanced Directives 01/17/2021 04/06/2020  Does Patient Have a Medical Advance Directive? Yes Yes  Type of Estate agent of [place];Living will Living will  Does patient want to make changes to medical advance directive? No - Patient declined No - Patient declined  Copy of Healthcare Power of Attorney in Chart? No - copy requested [facility] Utilization Over the Past 12 Months: # of hospitalizations or ER visits: 0 # of surgeries: 0  Review of Systems    Patient reports that her overall health is better compared to last year.  History obtained from chart review and the patient  Patient Reported Readings (BP, Pulse, CBG, Weight, etc) none  Pain Assessment Pain : No/denies pain     Current  Medications & Allergies (verified) Allergies as of 01/17/2021   No Known Allergies     Medication List       Accurate as of January 17, 2021  4:36 PM. If you have any questions, ask your nurse or doctor.        amLODipine 10 MG tablet Commonly known as: NORVASC Take 1 tablet (10 mg total) by mouth daily.   aspirin 325 MG tablet Take 325 mg by mouth at bedtime.   CoQ10 100 MG Caps Take by mouth daily.   omega-3 fish oil 1000 MG Caps capsule Commonly known as: MAXEPA Take 1 capsule by mouth daily.   rosuvastatin 20 MG tablet Commonly known as: Crestor Take 1 tablet (20 mg total) by mouth daily.       History (reviewed): Past Medical History:  Diagnosis Date  . Hx of CABG   . Rectocele   . Uterine prolapse    Past Surgical History:  Procedure Laterality Date  . CARDIAC VALVE REPLACEMENT    . CORONARY ARTERY BYPASS GRAFT     Family History  Problem Relation Age of Onset  . Hypertension Mother   . Heart attack Mother   . Cancer Father   . Penile cancer Father   . Diabetes Sister    Social History   Socioeconomic History  . Marital status: Widowed    Spouse name: Not on file  . Number of children: 4  . Years of education: 61  . Highest education level: Bachelor's degree (e.g., BA, AB, BS)  Occupational History  . Occupation: Retired  . Occupation: Works part time    Comment: 3  mornings at the  preschool  Tobacco Use  . Smoking status: Never Smoker  . Smokeless tobacco: Never Used  Vaping Use  . Vaping Use: Never used  Substance and Sexual Activity  . Alcohol use: Yes    Alcohol/week: 1.0 standard drink    Types: 1 Glasses of wine per week    Comment: Occasionally  . Drug use: Never  . Sexual activity: Not Currently    Birth control/protection: Abstinence  Other Topics Concern  . Not on file  Social History Narrative   Lives alone but two of her children live close by. She watches her grandkids everyday and sees her family every day. She also has a  puppy. She has not been able to exercise to due her knee and her back.    Social Determinants of Health   Financial Resource Strain: Low Risk   . Difficulty of Paying Living Expenses: Not hard at all  Food Insecurity: No Food Insecurity  . Worried About Programme researcher, broadcasting/film/video in the Last Year: Never true  . Ran Out of Food in the Last Year: Never true  Transportation Needs: No Transportation Needs  . Lack of Transportation (Medical): No  . Lack of Transportation (Non-Medical): No  Physical Activity: Inactive  . Days of Exercise per Week: 0 days  . Minutes of Exercise per Session: 0 min  Stress: No Stress Concern Present  . Feeling of Stress : Not at all  Social Connections: Moderately Integrated  . Frequency of Communication with Friends and Family: More than three times a week  . Frequency of Social Gatherings with Friends and Family: More than three times a week  . Attends Religious Services: More than 4 times per year  . Active Member of Clubs or Organizations: Yes  . Attends Banker Meetings: More than 4 times per year  . Marital Status: Widowed    Activities of Daily Living In your present state of health, do you have any difficulty performing the following activities: 01/17/2021  Hearing? N  Vision? Y  Comment has not had an eye exam.  Difficulty concentrating or making decisions? N  Walking or climbing stairs? Y  Comment due to knee problems  Dressing or bathing? N  Doing errands, shopping? N  Preparing Food and eating ? N  Using the Toilet? N  In the past six months, have you accidently leaked urine? Y  Comment due to her history prolapse; she has some leakage at times.  Do you have problems with loss of bowel control? N  Managing your Medications? N  Managing your Finances? N  Housekeeping or managing your Housekeeping? Y  Comment knee problems.    Patient Education/ Literacy How often do you need to have someone help you when you read instructions,  pamphlets, or other written materials from your doctor or pharmacy?: 1 - Never What is the last grade level you completed in school?: Bachelor's Degree  Exercise Current Exercise Habits: The patient does not participate in regular exercise at present, Exercise limited by: orthopedic condition(s) (knee problems)  Diet Patient reports consuming 3 meals a day and 1 snack(s) a day Patient reports that her primary diet is: Regular Patient reports that she does have regular access to food.   Depression Screen PHQ 2/9 Scores 01/17/2021 04/06/2020  PHQ - 2 Score 0 0  PHQ- 9 Score 0 3     Fall Risk Fall Risk  01/17/2021 04/06/2020  Falls in the past year? 0 0  Number falls in past  yr: 0 0  Injury with Fall? 0 0  Risk for fall due to : No Fall Risks -  Follow up Falls evaluation completed -     Objective:  Alicia MandrilMona Ake seemed alert and oriented and she participated appropriately during our telephone visit.  Blood Pressure Weight BMI  BP Readings from Last 3 Encounters:  11/18/20 113/70  05/18/20 129/64  04/06/20 (!) 153/75   Wt Readings from Last 3 Encounters:  11/18/20 182 lb (82.6 kg)  05/18/20 183 lb 3.2 oz (83.1 kg)  04/06/20 184 lb 8 oz (83.7 kg)   BMI Readings from Last 1 Encounters:  11/18/20 28.56 kg/m    *Unable to obtain current vital signs, weight, and BMI due to telephone visit type  Hearing/Vision  . Averleigh did not seem to have difficulty with hearing/understanding during the telephone conversation . Reports that she has not had a formal eye exam by an eye care professional within the past year . Reports that she has not had a formal hearing evaluation within the past year *Unable to fully assess hearing and vision during telephone visit type  Cognitive Function: 6CIT Screen 01/17/2021  What Year? 0 points  What month? 0 points  What time? 0 points  Count back from 20 0 points  Months in reverse 0 points  Repeat phrase 0 points  Total Score 0   (Normal:0-7,  Significant for Dysfunction: >8)  Normal Cognitive Function Screening: Yes   Immunization & Health Maintenance Record Immunization History  Administered Date(s) Administered  . PFIZER(Purple Top)SARS-COV-2 Vaccination 01/14/2020, 02/13/2020    Health Maintenance  Topic Date Due  . COVID-19 Vaccine (3 - Booster for Pfizer series) 02/02/2021 (Originally 08/14/2020)  . INFLUENZA VACCINE  02/10/2021 (Originally 06/13/2020)  . DEXA SCAN  01/17/2022 (Originally 01/19/2010)  . COLONOSCOPY (Pts [id number] Insurance coverage will need to be confirmed)  01/17/2022 (Originally 01/19/1990)  . TETANUS/TDAP  01/17/2022 (Originally 01/20/1964)  . Hepatitis C Screening  01/17/2022 (Originally 12/29/1945)  . PNA vac Low Risk Adult (1 of 2 - PCV13) 01/17/2022 (Originally 01/19/2010)  . HPV VACCINES  Aged Out       Assessment  This is a routine wellness examination for [facility].  Health Maintenance: Due or Overdue There are no preventive care reminders to display for this patient.  Alicia MandrilMona South does not need a referral for Community Assistance: Care Management:   no Social Work:    no Prescription Assistance:  no Nutrition/Diabetes Education:  no   Plan:  Personalized Goals Goals Addressed              This Visit's Progress   .  Patient Stated (pt-stated)        01/17/2021 AWV Goal: Improved Nutrition/Diet  . Patient will verbalize understanding that diet plays an important role in overall health and that a poor diet is a risk factor for many chronic medical conditions.  . Over the next year, patient will improve self management of their diet by incorporating more water. . Patient will utilize available community resources to help with food acquisition if needed (ex: food pantries, [address], etc) . Patient will work with nutrition specialist if a referral was made       Personalized Health Maintenance & Screening Recommendations  Pneumococcal vaccine  Influenza vaccine Td  vaccine Screening mammography Bone densitometry screening Colorectal cancer screening Shingrix vaccine  Lung Cancer Screening Recommended: no (Low Dose CT Chest recommended if Age 56-80 years, 30 pack-year currently smoking OR have quit w/in  past 15 years) Hepatitis C Screening recommended: no HIV Screening recommended: no  Advanced Directives: Written information was not prepared per patient's request.  Referrals & Orders No orders of the defined types were placed in this encounter.   Follow-up Plan . Follow-up with Alicia Coombe, DO as planned . Please let us know if you change your mind about the DEXA scan.  . Medicare wellness in one year.   I have personally reviewed and noted the following in the patient's chart:   . Medical and social history . Use of alcohol, tobacco or illicit drugs  . Current medications and supplements . Functional ability and status . Nutritional status . Physical activity . Advanced directives . List of other physicians . Hospitalizations, surgeries, and ER visits in previous 12 months . Vitals . Screenings to include cognitive, depression, and falls . Referrals and appointments  In addition, I have reviewed and discussed with Alicia Herrera certain preventive protocols, quality metrics, and best practice recommendations. A written personalized care plan for preventive services as well as general preventive health recommendations is available and can be mailed to the patient at her request.      Modesto Charon, RN  01/17/2021

## 2021-02-15 DIAGNOSIS — M25552 Pain in left hip: Secondary | ICD-10-CM | POA: Diagnosis not present

## 2021-02-15 DIAGNOSIS — M25551 Pain in right hip: Secondary | ICD-10-CM | POA: Diagnosis not present

## 2021-02-15 DIAGNOSIS — M9902 Segmental and somatic dysfunction of thoracic region: Secondary | ICD-10-CM | POA: Diagnosis not present

## 2021-02-15 DIAGNOSIS — M5451 Vertebrogenic low back pain: Secondary | ICD-10-CM | POA: Diagnosis not present

## 2021-02-15 DIAGNOSIS — M9905 Segmental and somatic dysfunction of pelvic region: Secondary | ICD-10-CM | POA: Diagnosis not present

## 2021-02-15 DIAGNOSIS — M9904 Segmental and somatic dysfunction of sacral region: Secondary | ICD-10-CM | POA: Diagnosis not present

## 2021-02-15 DIAGNOSIS — M9903 Segmental and somatic dysfunction of lumbar region: Secondary | ICD-10-CM | POA: Diagnosis not present

## 2021-02-19 DIAGNOSIS — M9902 Segmental and somatic dysfunction of thoracic region: Secondary | ICD-10-CM | POA: Diagnosis not present

## 2021-02-19 DIAGNOSIS — M9904 Segmental and somatic dysfunction of sacral region: Secondary | ICD-10-CM | POA: Diagnosis not present

## 2021-02-19 DIAGNOSIS — M25552 Pain in left hip: Secondary | ICD-10-CM | POA: Diagnosis not present

## 2021-02-19 DIAGNOSIS — M25551 Pain in right hip: Secondary | ICD-10-CM | POA: Diagnosis not present

## 2021-02-19 DIAGNOSIS — M9903 Segmental and somatic dysfunction of lumbar region: Secondary | ICD-10-CM | POA: Diagnosis not present

## 2021-02-19 DIAGNOSIS — M9905 Segmental and somatic dysfunction of pelvic region: Secondary | ICD-10-CM | POA: Diagnosis not present

## 2021-02-19 DIAGNOSIS — M5451 Vertebrogenic low back pain: Secondary | ICD-10-CM | POA: Diagnosis not present

## 2021-02-22 DIAGNOSIS — M25551 Pain in right hip: Secondary | ICD-10-CM | POA: Diagnosis not present

## 2021-02-22 DIAGNOSIS — M25552 Pain in left hip: Secondary | ICD-10-CM | POA: Diagnosis not present

## 2021-02-22 DIAGNOSIS — M9905 Segmental and somatic dysfunction of pelvic region: Secondary | ICD-10-CM | POA: Diagnosis not present

## 2021-02-22 DIAGNOSIS — M9904 Segmental and somatic dysfunction of sacral region: Secondary | ICD-10-CM | POA: Diagnosis not present

## 2021-02-22 DIAGNOSIS — M5451 Vertebrogenic low back pain: Secondary | ICD-10-CM | POA: Diagnosis not present

## 2021-02-22 DIAGNOSIS — M9902 Segmental and somatic dysfunction of thoracic region: Secondary | ICD-10-CM | POA: Diagnosis not present

## 2021-02-22 DIAGNOSIS — M9903 Segmental and somatic dysfunction of lumbar region: Secondary | ICD-10-CM | POA: Diagnosis not present

## 2021-03-03 DIAGNOSIS — M25552 Pain in left hip: Secondary | ICD-10-CM | POA: Diagnosis not present

## 2021-03-03 DIAGNOSIS — M9904 Segmental and somatic dysfunction of sacral region: Secondary | ICD-10-CM | POA: Diagnosis not present

## 2021-03-03 DIAGNOSIS — M9905 Segmental and somatic dysfunction of pelvic region: Secondary | ICD-10-CM | POA: Diagnosis not present

## 2021-03-03 DIAGNOSIS — M9902 Segmental and somatic dysfunction of thoracic region: Secondary | ICD-10-CM | POA: Diagnosis not present

## 2021-03-03 DIAGNOSIS — M5451 Vertebrogenic low back pain: Secondary | ICD-10-CM | POA: Diagnosis not present

## 2021-03-03 DIAGNOSIS — M9903 Segmental and somatic dysfunction of lumbar region: Secondary | ICD-10-CM | POA: Diagnosis not present

## 2021-03-03 DIAGNOSIS — M25551 Pain in right hip: Secondary | ICD-10-CM | POA: Diagnosis not present

## 2021-03-08 DIAGNOSIS — M9902 Segmental and somatic dysfunction of thoracic region: Secondary | ICD-10-CM | POA: Diagnosis not present

## 2021-03-08 DIAGNOSIS — M9904 Segmental and somatic dysfunction of sacral region: Secondary | ICD-10-CM | POA: Diagnosis not present

## 2021-03-08 DIAGNOSIS — M25551 Pain in right hip: Secondary | ICD-10-CM | POA: Diagnosis not present

## 2021-03-08 DIAGNOSIS — M9905 Segmental and somatic dysfunction of pelvic region: Secondary | ICD-10-CM | POA: Diagnosis not present

## 2021-03-08 DIAGNOSIS — M25552 Pain in left hip: Secondary | ICD-10-CM | POA: Diagnosis not present

## 2021-03-08 DIAGNOSIS — M9903 Segmental and somatic dysfunction of lumbar region: Secondary | ICD-10-CM | POA: Diagnosis not present

## 2021-03-08 DIAGNOSIS — M5451 Vertebrogenic low back pain: Secondary | ICD-10-CM | POA: Diagnosis not present

## 2021-03-18 IMAGING — DX DG KNEE COMPLETE 4+V*R*
4 series · 4 of 4 positions shown · non-contrast
Comparison: None.

CLINICAL DATA: Acute bilateral knee pain and swelling without known
injury.

EXAM:
RIGHT KNEE - COMPLETE 4+ VIEW

[knee ap]
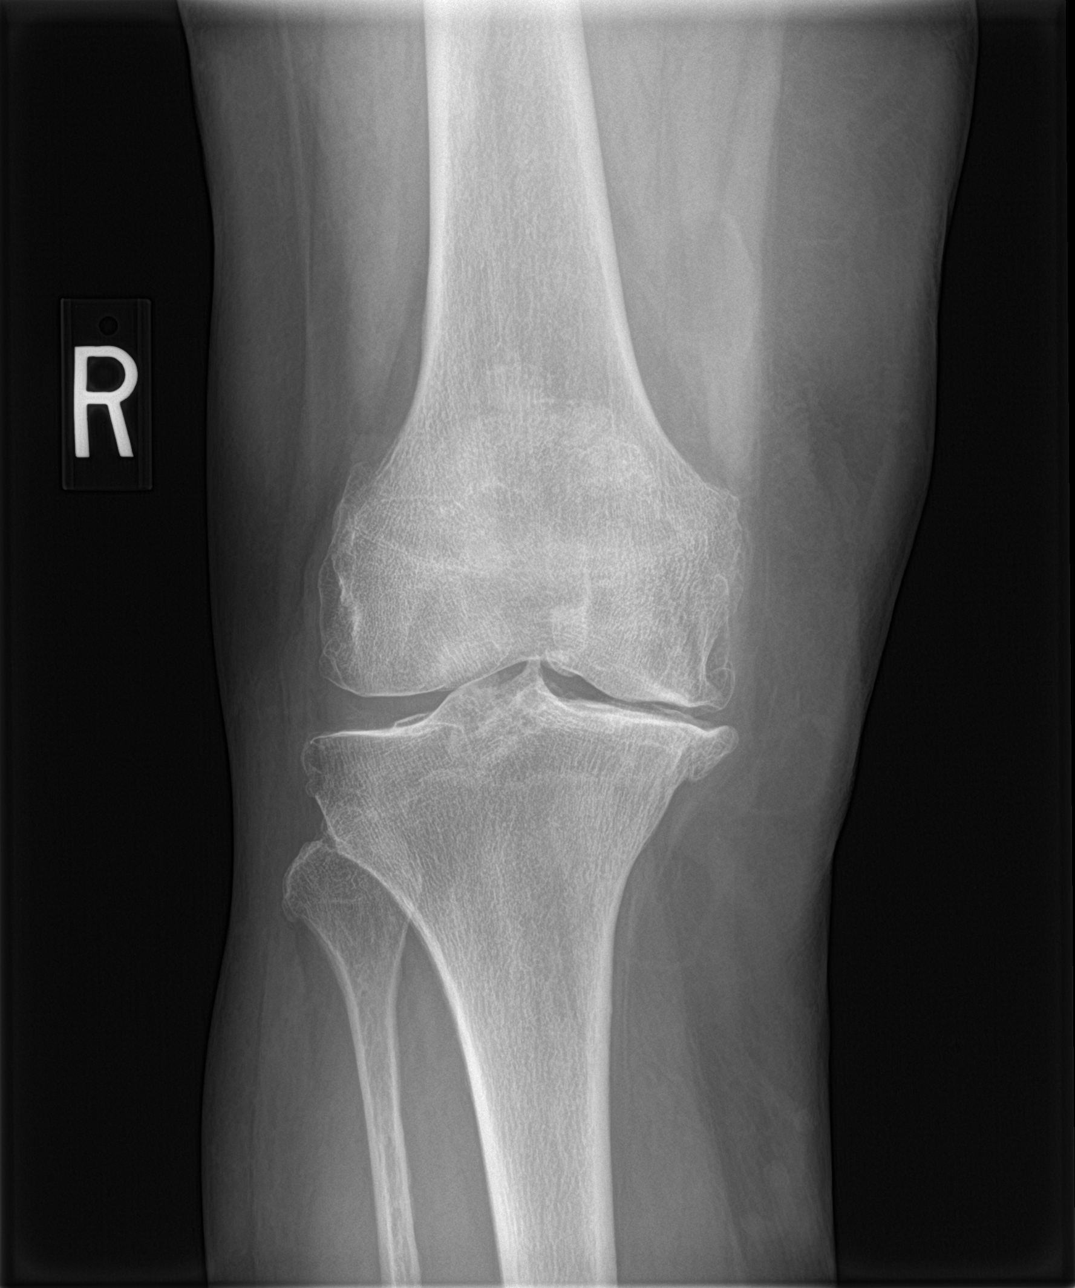

[knee lat]
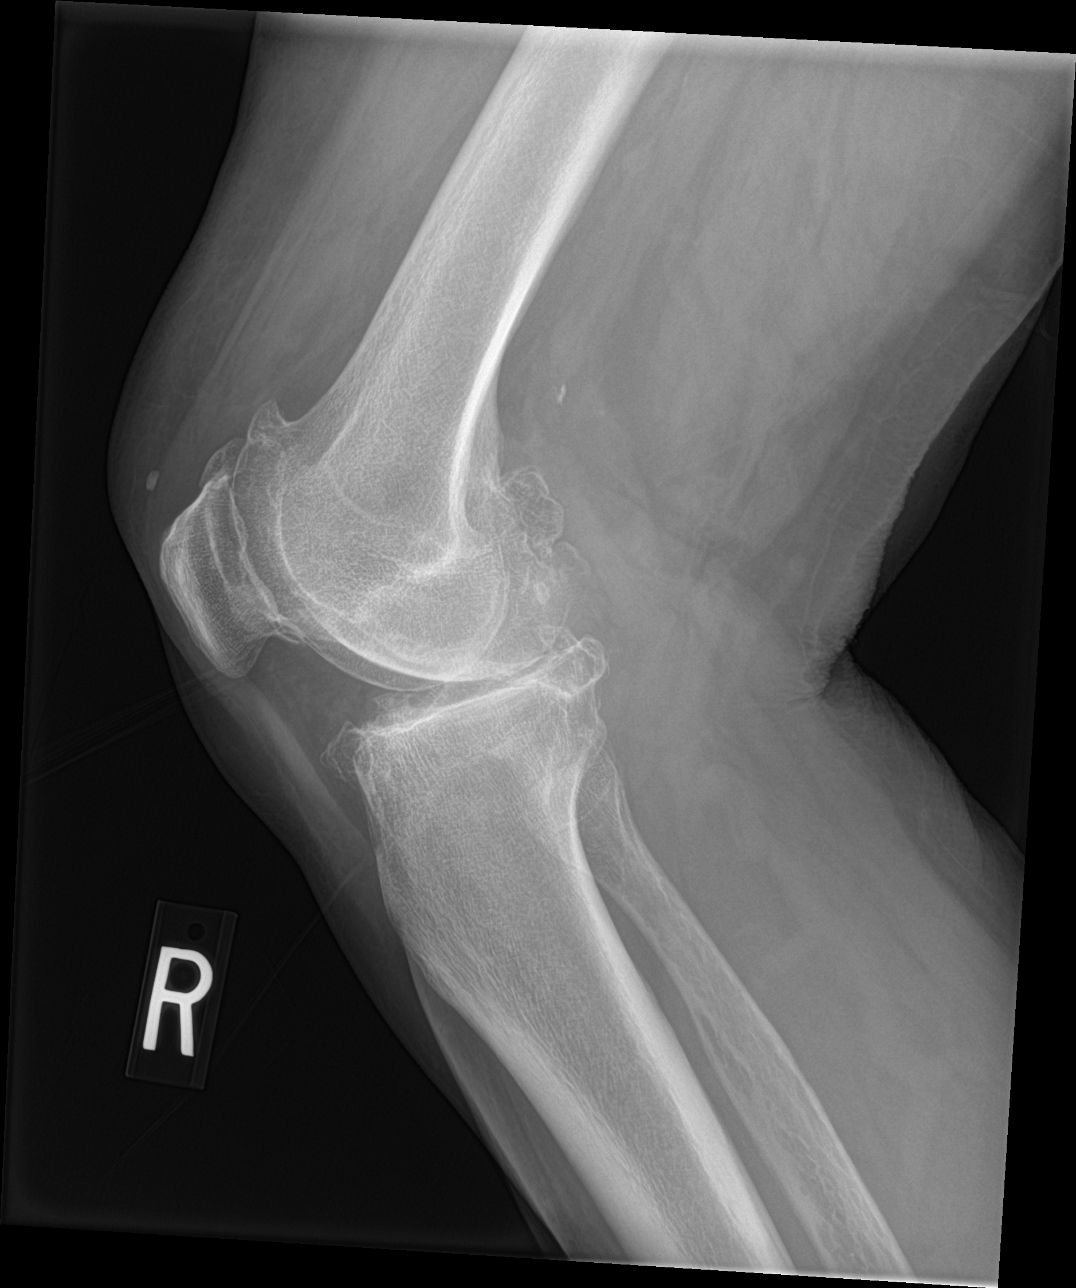

[knee obl (1 of 2)]
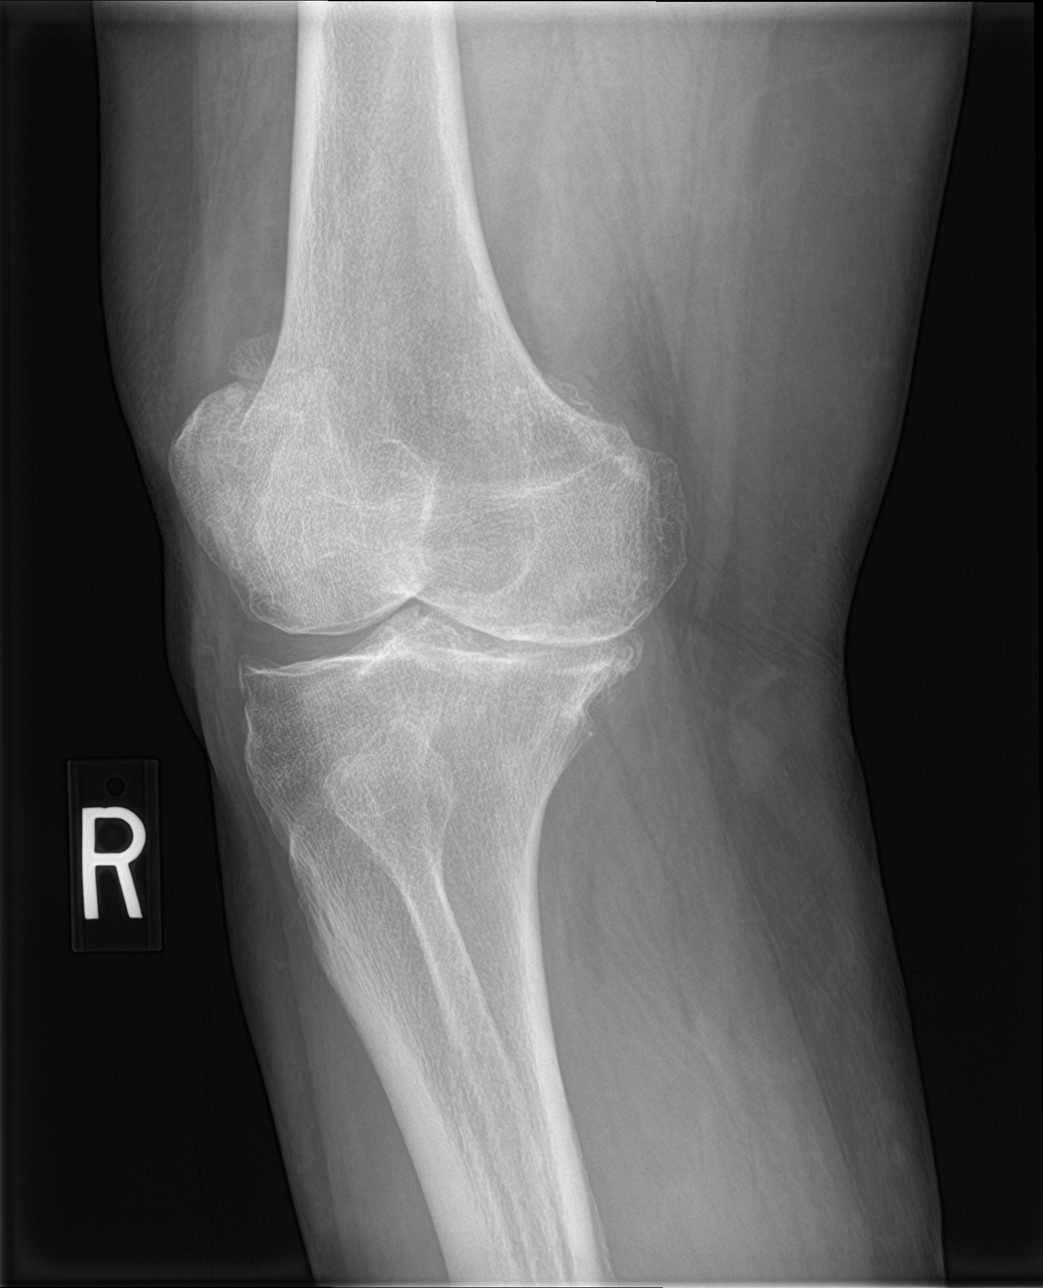

[knee obl (2 of 2)]
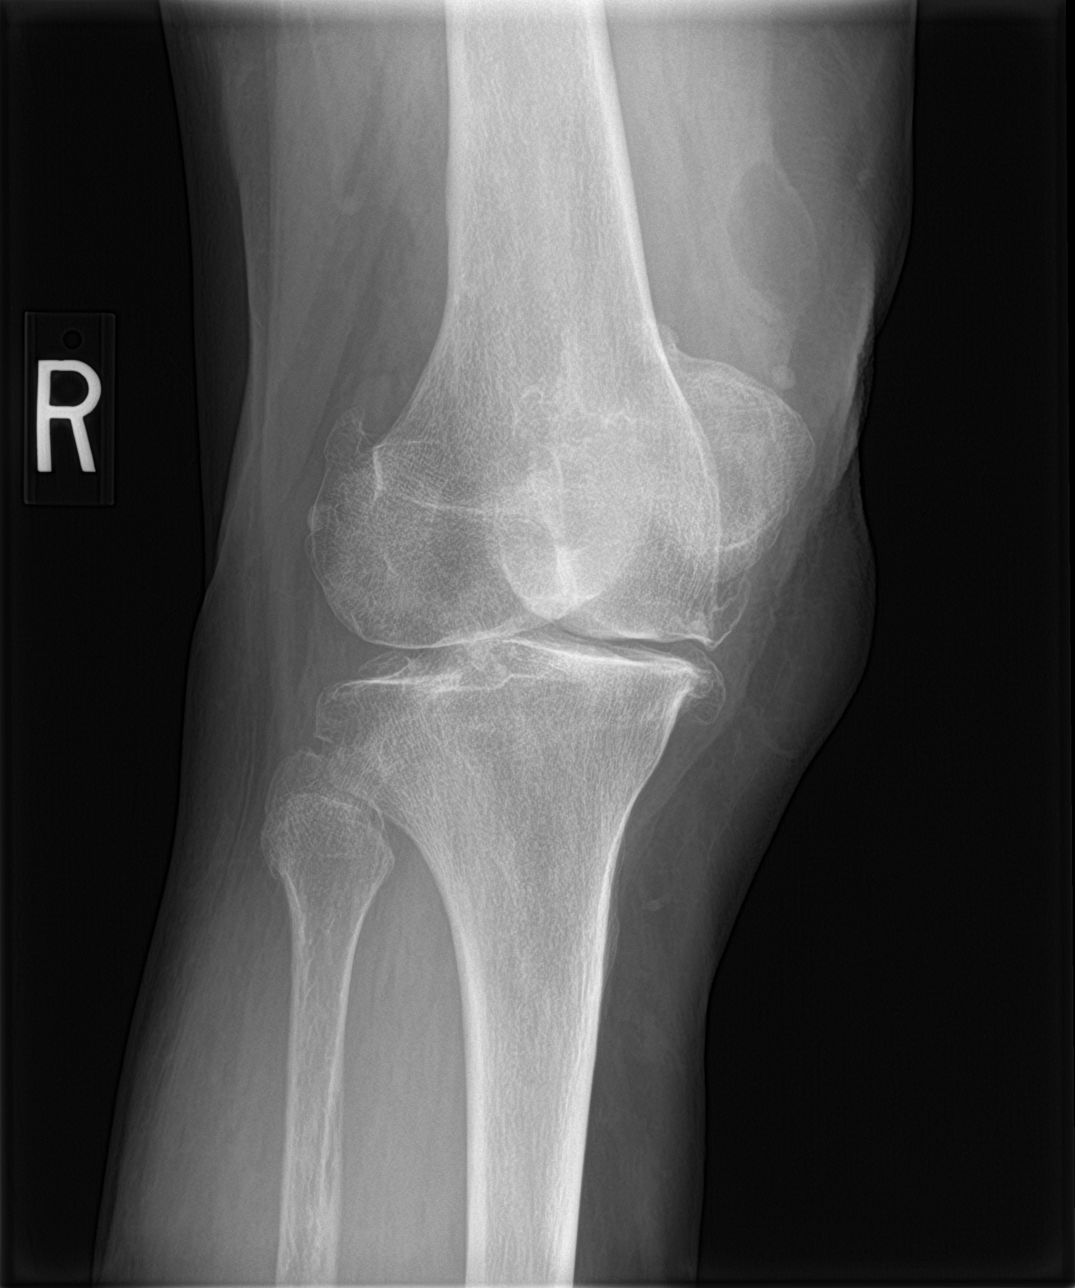

[4 of 4 positions shown; findings below may reference images not displayed]

FINDINGS: No evidence of fracture, dislocation, or joint effusion. Severe
narrowing of medial joint space is noted with osteophyte formation.
Moderate narrowing of patellofemoral space is noted with osteophyte
formation. Soft tissues are unremarkable.
IMPRESSION: Severe degenerative joint disease. No acute abnormality seen in the
right knee.

## 2021-03-22 DIAGNOSIS — M9902 Segmental and somatic dysfunction of thoracic region: Secondary | ICD-10-CM | POA: Diagnosis not present

## 2021-03-22 DIAGNOSIS — M25552 Pain in left hip: Secondary | ICD-10-CM | POA: Diagnosis not present

## 2021-03-22 DIAGNOSIS — M9904 Segmental and somatic dysfunction of sacral region: Secondary | ICD-10-CM | POA: Diagnosis not present

## 2021-03-22 DIAGNOSIS — M25551 Pain in right hip: Secondary | ICD-10-CM | POA: Diagnosis not present

## 2021-03-22 DIAGNOSIS — M9903 Segmental and somatic dysfunction of lumbar region: Secondary | ICD-10-CM | POA: Diagnosis not present

## 2021-03-22 DIAGNOSIS — M5451 Vertebrogenic low back pain: Secondary | ICD-10-CM | POA: Diagnosis not present

## 2021-03-22 DIAGNOSIS — M9905 Segmental and somatic dysfunction of pelvic region: Secondary | ICD-10-CM | POA: Diagnosis not present

## 2021-04-05 DIAGNOSIS — M9904 Segmental and somatic dysfunction of sacral region: Secondary | ICD-10-CM | POA: Diagnosis not present

## 2021-04-05 DIAGNOSIS — M25552 Pain in left hip: Secondary | ICD-10-CM | POA: Diagnosis not present

## 2021-04-05 DIAGNOSIS — M9905 Segmental and somatic dysfunction of pelvic region: Secondary | ICD-10-CM | POA: Diagnosis not present

## 2021-04-05 DIAGNOSIS — M25551 Pain in right hip: Secondary | ICD-10-CM | POA: Diagnosis not present

## 2021-04-05 DIAGNOSIS — M5451 Vertebrogenic low back pain: Secondary | ICD-10-CM | POA: Diagnosis not present

## 2021-04-05 DIAGNOSIS — M9903 Segmental and somatic dysfunction of lumbar region: Secondary | ICD-10-CM | POA: Diagnosis not present

## 2021-04-05 DIAGNOSIS — M9902 Segmental and somatic dysfunction of thoracic region: Secondary | ICD-10-CM | POA: Diagnosis not present

## 2021-04-15 DIAGNOSIS — M25551 Pain in right hip: Secondary | ICD-10-CM | POA: Diagnosis not present

## 2021-04-15 DIAGNOSIS — M9902 Segmental and somatic dysfunction of thoracic region: Secondary | ICD-10-CM | POA: Diagnosis not present

## 2021-04-15 DIAGNOSIS — M9904 Segmental and somatic dysfunction of sacral region: Secondary | ICD-10-CM | POA: Diagnosis not present

## 2021-04-15 DIAGNOSIS — M5451 Vertebrogenic low back pain: Secondary | ICD-10-CM | POA: Diagnosis not present

## 2021-04-15 DIAGNOSIS — M9903 Segmental and somatic dysfunction of lumbar region: Secondary | ICD-10-CM | POA: Diagnosis not present

## 2021-04-15 DIAGNOSIS — M25552 Pain in left hip: Secondary | ICD-10-CM | POA: Diagnosis not present

## 2021-04-15 DIAGNOSIS — M9905 Segmental and somatic dysfunction of pelvic region: Secondary | ICD-10-CM | POA: Diagnosis not present

## 2021-04-18 DIAGNOSIS — M25552 Pain in left hip: Secondary | ICD-10-CM | POA: Diagnosis not present

## 2021-04-18 DIAGNOSIS — M25551 Pain in right hip: Secondary | ICD-10-CM | POA: Diagnosis not present

## 2021-04-18 DIAGNOSIS — M9904 Segmental and somatic dysfunction of sacral region: Secondary | ICD-10-CM | POA: Diagnosis not present

## 2021-04-18 DIAGNOSIS — M9902 Segmental and somatic dysfunction of thoracic region: Secondary | ICD-10-CM | POA: Diagnosis not present

## 2021-04-18 DIAGNOSIS — M9905 Segmental and somatic dysfunction of pelvic region: Secondary | ICD-10-CM | POA: Diagnosis not present

## 2021-04-18 DIAGNOSIS — M5451 Vertebrogenic low back pain: Secondary | ICD-10-CM | POA: Diagnosis not present

## 2021-04-18 DIAGNOSIS — M9903 Segmental and somatic dysfunction of lumbar region: Secondary | ICD-10-CM | POA: Diagnosis not present

## 2021-04-21 DIAGNOSIS — R69 Illness, unspecified: Secondary | ICD-10-CM | POA: Diagnosis not present

## 2021-04-22 DIAGNOSIS — M25552 Pain in left hip: Secondary | ICD-10-CM | POA: Diagnosis not present

## 2021-04-22 DIAGNOSIS — M9902 Segmental and somatic dysfunction of thoracic region: Secondary | ICD-10-CM | POA: Diagnosis not present

## 2021-04-22 DIAGNOSIS — M9904 Segmental and somatic dysfunction of sacral region: Secondary | ICD-10-CM | POA: Diagnosis not present

## 2021-04-22 DIAGNOSIS — M9903 Segmental and somatic dysfunction of lumbar region: Secondary | ICD-10-CM | POA: Diagnosis not present

## 2021-04-22 DIAGNOSIS — M5451 Vertebrogenic low back pain: Secondary | ICD-10-CM | POA: Diagnosis not present

## 2021-04-22 DIAGNOSIS — M25551 Pain in right hip: Secondary | ICD-10-CM | POA: Diagnosis not present

## 2021-04-22 DIAGNOSIS — M9905 Segmental and somatic dysfunction of pelvic region: Secondary | ICD-10-CM | POA: Diagnosis not present

## 2021-04-25 DIAGNOSIS — M5451 Vertebrogenic low back pain: Secondary | ICD-10-CM | POA: Diagnosis not present

## 2021-04-25 DIAGNOSIS — M25551 Pain in right hip: Secondary | ICD-10-CM | POA: Diagnosis not present

## 2021-04-25 DIAGNOSIS — M9905 Segmental and somatic dysfunction of pelvic region: Secondary | ICD-10-CM | POA: Diagnosis not present

## 2021-04-25 DIAGNOSIS — M9902 Segmental and somatic dysfunction of thoracic region: Secondary | ICD-10-CM | POA: Diagnosis not present

## 2021-04-25 DIAGNOSIS — M9904 Segmental and somatic dysfunction of sacral region: Secondary | ICD-10-CM | POA: Diagnosis not present

## 2021-04-25 DIAGNOSIS — M9903 Segmental and somatic dysfunction of lumbar region: Secondary | ICD-10-CM | POA: Diagnosis not present

## 2021-04-25 DIAGNOSIS — M25552 Pain in left hip: Secondary | ICD-10-CM | POA: Diagnosis not present

## 2021-05-02 DIAGNOSIS — M25552 Pain in left hip: Secondary | ICD-10-CM | POA: Diagnosis not present

## 2021-05-02 DIAGNOSIS — M9904 Segmental and somatic dysfunction of sacral region: Secondary | ICD-10-CM | POA: Diagnosis not present

## 2021-05-02 DIAGNOSIS — M25551 Pain in right hip: Secondary | ICD-10-CM | POA: Diagnosis not present

## 2021-05-02 DIAGNOSIS — M9905 Segmental and somatic dysfunction of pelvic region: Secondary | ICD-10-CM | POA: Diagnosis not present

## 2021-05-02 DIAGNOSIS — M9903 Segmental and somatic dysfunction of lumbar region: Secondary | ICD-10-CM | POA: Diagnosis not present

## 2021-05-02 DIAGNOSIS — M9902 Segmental and somatic dysfunction of thoracic region: Secondary | ICD-10-CM | POA: Diagnosis not present

## 2021-05-02 DIAGNOSIS — M5451 Vertebrogenic low back pain: Secondary | ICD-10-CM | POA: Diagnosis not present

## 2021-05-11 DIAGNOSIS — M25552 Pain in left hip: Secondary | ICD-10-CM | POA: Diagnosis not present

## 2021-05-11 DIAGNOSIS — M25551 Pain in right hip: Secondary | ICD-10-CM | POA: Diagnosis not present

## 2021-05-11 DIAGNOSIS — M9902 Segmental and somatic dysfunction of thoracic region: Secondary | ICD-10-CM | POA: Diagnosis not present

## 2021-05-11 DIAGNOSIS — M9905 Segmental and somatic dysfunction of pelvic region: Secondary | ICD-10-CM | POA: Diagnosis not present

## 2021-05-11 DIAGNOSIS — M9904 Segmental and somatic dysfunction of sacral region: Secondary | ICD-10-CM | POA: Diagnosis not present

## 2021-05-11 DIAGNOSIS — M5451 Vertebrogenic low back pain: Secondary | ICD-10-CM | POA: Diagnosis not present

## 2021-05-11 DIAGNOSIS — M9903 Segmental and somatic dysfunction of lumbar region: Secondary | ICD-10-CM | POA: Diagnosis not present

## 2021-05-18 ENCOUNTER — Encounter: Payer: Self-pay | Admitting: Family Medicine

## 2021-05-18 ENCOUNTER — Ambulatory Visit (INDEPENDENT_AMBULATORY_CARE_PROVIDER_SITE_OTHER): Payer: Medicare HMO | Admitting: Family Medicine

## 2021-05-18 ENCOUNTER — Other Ambulatory Visit: Payer: Self-pay

## 2021-05-18 DIAGNOSIS — E782 Mixed hyperlipidemia: Secondary | ICD-10-CM | POA: Diagnosis not present

## 2021-05-18 DIAGNOSIS — I10 Essential (primary) hypertension: Secondary | ICD-10-CM

## 2021-05-18 NOTE — Assessment & Plan Note (Signed)
Does not want to restart statin due to muscle aches.  She will discuss further with cadiology at upcoming appt.

## 2021-05-18 NOTE — Patient Instructions (Signed)
Continue to monitor blood pressure at home.  See me again in 6 months

## 2021-05-18 NOTE — Progress Notes (Signed)
Alicia Herrera - 75 y.o. female MRN [id number]  Date of birth: Apr 22, 1946  Subjective No chief complaint on file.   HPI Alicia Herrera is a 75 y.o. female here today here today for follow up visit.  She reports that she is doing well.   She hs discontinued her amlodipine due to ankle swelling.  Since stopping she has been checking blood pressure daily at home.  Readings are usually around 130/70.  She has also reduced her salt intake. She denies anginal symptom, headache or increased vision changes.    She has also stopped rosuvastatin.  She was astarted on atorvastatin previoulsy because she had myalgias with rosuvastatin.  Muscle and joint aches did not improve with change in medication so I had her restart crestor due to significantly elevated LDL and history fo CABG.  She will discuss furhte rat her upcing cardiology.    ROS:  A comprehensive ROS was completed and negative except as noted per HPI  No Known Allergies  Past Medical History:  Diagnosis Date   Hx of CABG    Rectocele    Uterine prolapse     Past Surgical History:  Procedure Laterality Date   CARDIAC VALVE REPLACEMENT     CORONARY ARTERY BYPASS GRAFT      Social History   Socioeconomic History   Marital status: Widowed    Spouse name: Not on file   Number of children: 4   Years of education: 17   Highest education level: Bachelor's degree (e.g., BA, AB, BS)  Occupational History   Occupation: Retired   Occupation: Works part time    Comment: 3  mornings at the preschool  Tobacco Use   Smoking status: Never   Smokeless tobacco: Never  Vaping Use   Vaping Use: Never used  Substance and Sexual Activity   Alcohol use: Yes    Alcohol/week: 1.0 standard drink    Types: 1 Glasses of wine per week    Comment: Occasionally   Drug use: Never   Sexual activity: Not Currently    Birth control/protection: Abstinence  Other Topics Concern   Not on file  Social History Narrative   Lives alone but two of her  children live close by. She watches her grandkids everyday and sees her family every day. She also has a puppy. She has not been able to exercise to due her knee and her back.    Social Determinants of Health   Financial Resource Strain: Low Risk    Difficulty of Paying Living Expenses: Not hard at all  Food Insecurity: No Food Insecurity   Worried About Programme researcher, broadcasting/film/video in the Last Year: Never true   Ran Out of Food in the Last Year: Never true  Transportation Needs: No Transportation Needs   Lack of Transportation (Medical): No   Lack of Transportation (Non-Medical): No  Physical Activity: Inactive   Days of Exercise per Week: 0 days   Minutes of Exercise per Session: 0 min  Stress: No Stress Concern Present   Feeling of Stress : Not at all  Social Connections: Moderately Integrated   Frequency of Communication with Friends and Family: More than three times a week   Frequency of Social Gatherings with Friends and Family: More than three times a week   Attends Religious Services: More than 4 times per year   Active Member of [employer] or Organizations: Yes   Attends Banker Meetings: More than 4 times per year   Marital Status:  Widowed    Family History  Problem Relation Age of Onset   Hypertension Mother    Heart attack Mother    Cancer Father    Penile cancer Father    Diabetes Sister     Health Maintenance  Topic Date Due   Zoster Vaccines- Shingrix (1 of 2) Never done   COVID-19 Vaccine (3 - Booster for Pfizer series) 07/15/2020   DEXA SCAN  01/17/2022 (Originally 01/19/2010)   TETANUS/TDAP  01/17/2022 (Originally 01/20/1964)   Hepatitis C Screening  01/17/2022 (Originally 01/20/1963)   PNA vac Low Risk Adult (1 of 2 - PCV13) 01/17/2022 (Originally 01/19/2010)   INFLUENZA VACCINE  06/13/2021   HPV VACCINES  Aged Out      ----------------------------------------------------------------------------------------------------------------------------------------------------------------------------------------------------------------- Physical Exam BP 139/79   Pulse 80   Ht 5' 6.93" (1.7 m)   Wt 181 lb (82.1 kg)   BMI 28.41 kg/m   Physical Exam Constitutional:      Appearance: Normal appearance.  HENT:     Head: Normocephalic and atraumatic.  Eyes:     General: No scleral icterus. Cardiovascular:     Rate and Rhythm: Normal rate and regular rhythm.  Pulmonary:     Effort: Pulmonary effort is normal.     Breath sounds: Normal breath sounds.  Abdominal:     General: Abdomen is flat.  Musculoskeletal:     Cervical back: Neck supple.  Neurological:     Mental Status: She is alert.    ------------------------------------------------------------------------------------------------------------------------------------------------------------------------------------------------------------------- Assessment and Plan  Essential hypertension Blood pressure is at goal at for age and co-morbidities.  She will remain off amlodipine    I recommend she conitnue home monitoring. .  In addition they were instructed to follow a low sodium diet with regular exercise to help to maintain adequate control of blood pressure.    Hyperlipidemia Does not want to restart statin due to muscle aches.  She will discuss further with cadiology at upcoming appt.    No orders of the defined types were placed in this encounter.   Return in about 6 months (around 11/18/2021) for HTN.    This visit occurred during the SARS-CoV-2 public health emergency.  Safety protocols were in place, including screening questions prior to the visit, additional usage of staff PPE, and extensive cleaning of exam room while observing appropriate contact time as indicated for disinfecting solutions.

## 2021-05-18 NOTE — Assessment & Plan Note (Signed)
Blood pressure is at goal at for age and co-morbidities.  She will remain off amlodipine    I recommend she conitnue home monitoring. .  In addition they were instructed to follow a low sodium diet with regular exercise to help to maintain adequate control of blood pressure.

## 2021-05-25 DIAGNOSIS — M9904 Segmental and somatic dysfunction of sacral region: Secondary | ICD-10-CM | POA: Diagnosis not present

## 2021-05-25 DIAGNOSIS — M9905 Segmental and somatic dysfunction of pelvic region: Secondary | ICD-10-CM | POA: Diagnosis not present

## 2021-05-25 DIAGNOSIS — M25552 Pain in left hip: Secondary | ICD-10-CM | POA: Diagnosis not present

## 2021-05-25 DIAGNOSIS — M9903 Segmental and somatic dysfunction of lumbar region: Secondary | ICD-10-CM | POA: Diagnosis not present

## 2021-05-25 DIAGNOSIS — M5451 Vertebrogenic low back pain: Secondary | ICD-10-CM | POA: Diagnosis not present

## 2021-05-25 DIAGNOSIS — M25551 Pain in right hip: Secondary | ICD-10-CM | POA: Diagnosis not present

## 2021-05-25 DIAGNOSIS — M9902 Segmental and somatic dysfunction of thoracic region: Secondary | ICD-10-CM | POA: Diagnosis not present

## 2021-05-27 DIAGNOSIS — E7800 Pure hypercholesterolemia, unspecified: Secondary | ICD-10-CM | POA: Diagnosis not present

## 2021-05-27 DIAGNOSIS — Z951 Presence of aortocoronary bypass graft: Secondary | ICD-10-CM | POA: Diagnosis not present

## 2021-05-27 DIAGNOSIS — Z8679 Personal history of other diseases of the circulatory system: Secondary | ICD-10-CM | POA: Diagnosis not present

## 2021-05-27 DIAGNOSIS — I2510 Atherosclerotic heart disease of native coronary artery without angina pectoris: Secondary | ICD-10-CM | POA: Diagnosis not present

## 2021-05-27 DIAGNOSIS — Z952 Presence of prosthetic heart valve: Secondary | ICD-10-CM | POA: Diagnosis not present

## 2021-05-27 DIAGNOSIS — Z9889 Other specified postprocedural states: Secondary | ICD-10-CM | POA: Diagnosis not present

## 2021-05-27 DIAGNOSIS — I10 Essential (primary) hypertension: Secondary | ICD-10-CM | POA: Diagnosis not present

## 2021-05-30 DIAGNOSIS — Z951 Presence of aortocoronary bypass graft: Secondary | ICD-10-CM | POA: Diagnosis not present

## 2021-05-30 DIAGNOSIS — I2510 Atherosclerotic heart disease of native coronary artery without angina pectoris: Secondary | ICD-10-CM | POA: Diagnosis not present

## 2021-05-30 DIAGNOSIS — Z9889 Other specified postprocedural states: Secondary | ICD-10-CM | POA: Diagnosis not present

## 2021-05-30 DIAGNOSIS — Z8679 Personal history of other diseases of the circulatory system: Secondary | ICD-10-CM | POA: Diagnosis not present

## 2021-05-30 DIAGNOSIS — I10 Essential (primary) hypertension: Secondary | ICD-10-CM | POA: Diagnosis not present

## 2021-05-30 DIAGNOSIS — Z952 Presence of prosthetic heart valve: Secondary | ICD-10-CM | POA: Diagnosis not present

## 2021-05-30 DIAGNOSIS — E7800 Pure hypercholesterolemia, unspecified: Secondary | ICD-10-CM | POA: Diagnosis not present

## 2021-07-15 DIAGNOSIS — M9904 Segmental and somatic dysfunction of sacral region: Secondary | ICD-10-CM | POA: Diagnosis not present

## 2021-07-15 DIAGNOSIS — M25551 Pain in right hip: Secondary | ICD-10-CM | POA: Diagnosis not present

## 2021-07-15 DIAGNOSIS — M5451 Vertebrogenic low back pain: Secondary | ICD-10-CM | POA: Diagnosis not present

## 2021-07-15 DIAGNOSIS — M9905 Segmental and somatic dysfunction of pelvic region: Secondary | ICD-10-CM | POA: Diagnosis not present

## 2021-07-15 DIAGNOSIS — M25552 Pain in left hip: Secondary | ICD-10-CM | POA: Diagnosis not present

## 2021-07-15 DIAGNOSIS — M9903 Segmental and somatic dysfunction of lumbar region: Secondary | ICD-10-CM | POA: Diagnosis not present

## 2021-07-15 DIAGNOSIS — M9902 Segmental and somatic dysfunction of thoracic region: Secondary | ICD-10-CM | POA: Diagnosis not present

## 2021-07-19 DIAGNOSIS — M5451 Vertebrogenic low back pain: Secondary | ICD-10-CM | POA: Diagnosis not present

## 2021-07-19 DIAGNOSIS — M9902 Segmental and somatic dysfunction of thoracic region: Secondary | ICD-10-CM | POA: Diagnosis not present

## 2021-07-19 DIAGNOSIS — M25551 Pain in right hip: Secondary | ICD-10-CM | POA: Diagnosis not present

## 2021-07-19 DIAGNOSIS — M9903 Segmental and somatic dysfunction of lumbar region: Secondary | ICD-10-CM | POA: Diagnosis not present

## 2021-07-19 DIAGNOSIS — M25552 Pain in left hip: Secondary | ICD-10-CM | POA: Diagnosis not present

## 2021-07-19 DIAGNOSIS — M9904 Segmental and somatic dysfunction of sacral region: Secondary | ICD-10-CM | POA: Diagnosis not present

## 2021-07-19 DIAGNOSIS — M9905 Segmental and somatic dysfunction of pelvic region: Secondary | ICD-10-CM | POA: Diagnosis not present

## 2021-11-18 ENCOUNTER — Other Ambulatory Visit: Payer: Self-pay

## 2021-11-18 ENCOUNTER — Encounter: Payer: Self-pay | Admitting: Family Medicine

## 2021-11-18 ENCOUNTER — Ambulatory Visit (INDEPENDENT_AMBULATORY_CARE_PROVIDER_SITE_OTHER): Payer: Medicare HMO | Admitting: Family Medicine

## 2021-11-18 VITALS — BP 134/70 | HR 70 | Temp 97.8°F | Ht 66.9 in | Wt 187.0 lb

## 2021-11-18 DIAGNOSIS — E782 Mixed hyperlipidemia: Secondary | ICD-10-CM

## 2021-11-18 DIAGNOSIS — L729 Follicular cyst of the skin and subcutaneous tissue, unspecified: Secondary | ICD-10-CM | POA: Diagnosis not present

## 2021-11-18 DIAGNOSIS — I10 Essential (primary) hypertension: Secondary | ICD-10-CM | POA: Diagnosis not present

## 2021-11-18 MED ORDER — HYDROCHLOROTHIAZIDE 12.5 MG PO TABS: 12.5000 mg | ORAL_TABLET | Freq: Every day | ORAL | 3 refills | Status: DC

## 2021-11-18 MED ORDER — AMLODIPINE BESYLATE 5 MG PO TABS: 5.0000 mg | ORAL_TABLET | Freq: Every day | ORAL | 2 refills | Status: DC

## 2021-11-18 NOTE — Progress Notes (Signed)
Alicia Herrera - 76 y.o. female MRN [id number]  Date of birth: 1946/11/09  Subjective Chief Complaint  Patient presents with   Hypertension    HPI Alicia Herrera is a 76 y.o. female here today for follow up visit.  Reports that she is doing fairly well.  She continues on amlodipine for management of HTN and is taking at 10mg  currently.  She has experienced some increased LE edema since re-starting this.  No longer  taking rosuvastatin for management of cholesterol.   She does see cardiology (Dr. ) annually for history of CABG and AV repair.  Reports that they are trying to get [facility] approved.   She is due for updated labs today.    Additional concerns:  Has small cyst on L jawline.  Present for several years and hasn't really changed in size.  Denies pain unless she presses on this firmly.    ROS:  A comprehensive ROS was completed and negative except as noted per HPI   ROS:  A comprehensive ROS was completed and negative except as noted per HPI  No Known Allergies  Past Medical History:  Diagnosis Date   Hx of CABG    Rectocele    Uterine prolapse     Past Surgical History:  Procedure Laterality Date   CARDIAC VALVE REPLACEMENT     CORONARY ARTERY BYPASS GRAFT      Social History   Socioeconomic History   Marital status: Widowed    Spouse name: Not on file   Number of children: 4   Years of education: 17   Highest education level: Bachelor's degree (e.g., BA, AB, BS)  Occupational History   Occupation: Retired   Occupation: Works part time    Comment: 3  mornings at the preschool  Tobacco Use   Smoking status: Never   Smokeless tobacco: Never  Vaping Use   Vaping Use: Never used  Substance and Sexual Activity   Alcohol use: Yes    Alcohol/week: 1.0 standard drink    Types: 1 Glasses of wine per week    Comment: Occasionally   Drug use: Never   Sexual activity: Not Currently    Birth control/protection: Abstinence  Other Topics Concern   Not on file   Social History Narrative   Lives alone but two of her children live close by. She watches her grandkids everyday and sees her family every day. She also has a puppy. She has not been able to exercise to due her knee and her back.    Social Determinants of Health   Financial Resource Strain: Low Risk    Difficulty of Paying Living Expenses: Not hard at all  Food Insecurity: No Food Insecurity   Worried About [facility] in the Last Year: Never true   Ran Out of Food in the Last Year: Never true  Transportation Needs: No Transportation Needs   Lack of Transportation (Medical): No   Lack of Transportation (Non-Medical): No  Physical Activity: Inactive   Days of Exercise per Week: 0 days   Minutes of Exercise per Session: 0 min  Stress: No Stress Concern Present   Feeling of Stress : Not at all  Social Connections: Moderately Integrated   Frequency of Communication with Friends and Family: More than three times a week   Frequency of Social Gatherings with Friends and Family: More than three times a week   Attends Religious Services: More than 4 times per year   Active Member of Clubs or  Organizations: Yes   Attends Engineer, structural: More than 4 times per year   Marital Status: Widowed    Family History  Problem Relation Age of Onset   Hypertension Mother    Heart attack Mother    Cancer Father    Penile cancer Father    Diabetes Sister     Health Maintenance  Topic Date Due   Zoster Vaccines- Shingrix (1 of 2) Never done   Pneumonia Vaccine 71+ Years old (1 - PCV) Never done   COVID-19 Vaccine (3 - Booster for [employer] series) 04/09/2020   INFLUENZA VACCINE  Never done   DEXA SCAN  01/17/2022 (Originally 01/19/2010)   TETANUS/TDAP  01/17/2022 (Originally 01/20/1964)   Hepatitis C Screening  01/17/2022 (Originally 01/20/1963)   HPV VACCINES  Aged Out      ----------------------------------------------------------------------------------------------------------------------------------------------------------------------------------------------------------------- Physical Exam There were no vitals taken for this visit.  Physical Exam Constitutional:      Appearance: Normal appearance.  Eyes:     General: No scleral icterus. Cardiovascular:     Rate and Rhythm: Normal rate and regular rhythm.  Pulmonary:     Effort: Pulmonary effort is normal.     Breath sounds: Normal breath sounds.  Musculoskeletal:     Cervical back: Neck supple.  Neurological:     General: No focal deficit present.     Mental Status: She is alert.  Psychiatric:        Mood and Affect: Mood normal.        Behavior: Behavior normal.    ------------------------------------------------------------------------------------------------------------------------------------------------------------------------------------------------------------------- Assessment and Plan  Essential hypertension BP is well controlled however she is having increased edema with amlodipine.  I am going to have her increase amlodipine to 5mg  daily and add low dose HCTZ.  Return in 30 days for repeat BP and labs.    Hyperlipidemia Did not tolerated rosuvastatin previously.  Cardiology trying to get [facility] approved.  Updated lipid panel ordered today.   Cyst of skin No significant symptoms related to this currently.  She will let me know if it changes in size or becomes more painful.    No orders of the defined types were placed in this encounter.   No follow-ups on file.    This visit occurred during the SARS-CoV-2 public health emergency.  Safety protocols were in place, including screening questions prior to the visit, additional usage of staff PPE, and extensive cleaning of exam room while observing appropriate contact time as indicated for disinfecting solutions.

## 2021-11-18 NOTE — Assessment & Plan Note (Signed)
BP is well controlled however she is having increased edema with amlodipine.  I am going to have her increase amlodipine to 5mg  daily and add low dose HCTZ.  Return in 30 days for repeat BP and labs.

## 2021-11-18 NOTE — Assessment & Plan Note (Signed)
No significant symptoms related to this currently.  She will let me know if it changes in size or becomes more painful.

## 2021-11-18 NOTE — Assessment & Plan Note (Signed)
Did not tolerated rosuvastatin previously.  Cardiology trying to get [facility] approved.  Updated lipid panel ordered today.

## 2021-11-19 LAB — COMPLETE METABOLIC PANEL WITH GFR
AG Ratio: 1.7 (calc) (ref 1.0–2.5)
ALT: 11 U/L (ref 6–29)
AST: 14 U/L (ref 10–35)
Albumin: 4.4 g/dL (ref 3.6–5.1)
Alkaline phosphatase (APISO): 64 U/L (ref 37–153)
BUN: 13 mg/dL (ref 7–25)
CO2: 27 mmol/L (ref 20–32)
Calcium: 9.5 mg/dL (ref 8.6–10.4)
Chloride: 105 mmol/L (ref 98–110)
Creat: 0.8 mg/dL (ref 0.60–1.00)
Globulin: 2.6 g/dL (calc) (ref 1.9–3.7)
Glucose, Bld: 98 mg/dL (ref 65–99)
Potassium: 4.6 mmol/L (ref 3.5–5.3)
Sodium: 140 mmol/L (ref 135–146)
Total Bilirubin: 0.4 mg/dL (ref 0.2–1.2)
Total Protein: 7 g/dL (ref 6.1–8.1)
eGFR: 76 mL/min/{1.73_m2} (ref >=60)

## 2021-11-19 LAB — CBC WITH DIFFERENTIAL/PLATELET
Absolute Monocytes: 364 cells/uL (ref 200–950)
Basophils Absolute: 52 cells/uL (ref 0–200)
Basophils Relative: 1 %
Eosinophils Absolute: 551 cells/uL — ABNORMAL HIGH (ref 15–500)
Eosinophils Relative: 10.6 %
HCT: 38.5 % (ref 35.0–45.0)
Hemoglobin: 12.7 g/dL (ref 11.7–15.5)
Lymphs Abs: 1279 cells/uL (ref 850–3900)
MCH: 27.7 pg (ref 27.0–33.0)
MCHC: 33 g/dL (ref 32.0–36.0)
MCV: 84.1 fL (ref 80.0–100.0)
MPV: 9.4 fL (ref 7.5–12.5)
Monocytes Relative: 7 %
Neutro Abs: 2954 cells/uL (ref 1500–7800)
Neutrophils Relative %: 56.8 %
Platelets: 260 10*3/uL (ref 140–400)
RBC: 4.58 10*6/uL (ref 3.80–5.10)
RDW: 14.1 % (ref 11.0–15.0)
Total Lymphocyte: 24.6 %
WBC: 5.2 10*3/uL (ref 3.8–10.8)

## 2021-11-19 LAB — LIPID PANEL W/REFLEX DIRECT LDL
Cholesterol: 271 mg/dL — ABNORMAL HIGH (ref <200)
HDL: 50 mg/dL (ref >=50)
LDL Cholesterol (Calc): 192 mg/dL (calc) — ABNORMAL HIGH
Non-HDL Cholesterol (Calc): 221 mg/dL (calc) — ABNORMAL HIGH (ref <130)
Total CHOL/HDL Ratio: 5.4 (calc) — ABNORMAL HIGH (ref <5.0)
Triglycerides: 134 mg/dL (ref <150)

## 2021-12-19 ENCOUNTER — Ambulatory Visit (INDEPENDENT_AMBULATORY_CARE_PROVIDER_SITE_OTHER): Payer: Medicare HMO | Admitting: Family Medicine

## 2021-12-19 ENCOUNTER — Other Ambulatory Visit: Payer: Self-pay | Admitting: Family Medicine

## 2021-12-19 ENCOUNTER — Other Ambulatory Visit: Payer: Self-pay

## 2021-12-19 VITALS — BP 119/65 | HR 82

## 2021-12-19 DIAGNOSIS — I10 Essential (primary) hypertension: Secondary | ICD-10-CM | POA: Diagnosis not present

## 2021-12-19 LAB — BASIC METABOLIC PANEL
BUN: 15 mg/dL (ref 7–25)
CO2: 29 mmol/L (ref 20–32)
Calcium: 9.3 mg/dL (ref 8.6–10.4)
Chloride: 104 mmol/L (ref 98–110)
Creat: 0.85 mg/dL (ref 0.60–1.00)
Glucose, Bld: 109 mg/dL (ref 65–139)
Potassium: 3.8 mmol/L (ref 3.5–5.3)
Sodium: 142 mmol/L (ref 135–146)

## 2021-12-19 NOTE — Progress Notes (Signed)
Medical screening examination/treatment was performed by qualified clinical staff member and as supervising physician I was immediately available for consultation/collaboration. I have reviewed documentation and agree with assessment and plan.  Starleen Trussell, DO  

## 2021-12-19 NOTE — Progress Notes (Signed)
Established Patient Office Visit  Subjective:  Patient ID: Alicia Herrera, female    DOB: 12-04-1945  Age: 76 y.o. MRN: [id number]  CC:  Chief Complaint  Patient presents with   Hypertension    HPI Chrislynn Mosely presents for blood pressure check. Denies chest pain, shortness of breath or lightheadedness.   She states the lightheadedness has resolved.   Past Medical History:  Diagnosis Date   Hx of CABG    Rectocele    Uterine prolapse     Past Surgical History:  Procedure Laterality Date   CARDIAC VALVE REPLACEMENT     CORONARY ARTERY BYPASS GRAFT      Family History  Problem Relation Age of Onset   Hypertension Mother    Heart attack Mother    Cancer Father    Penile cancer Father    Diabetes Sister     Social History   Socioeconomic History   Marital status: Widowed    Spouse name: Not on file   Number of children: 4   Years of education: 17   Highest education level: Bachelor's degree (e.g., BA, AB, BS)  Occupational History   Occupation: Retired   Occupation: Works part time    Comment: 3  mornings at the preschool  Tobacco Use   Smoking status: Never   Smokeless tobacco: Never  Vaping Use   Vaping Use: Never used  Substance and Sexual Activity   Alcohol use: Yes    Alcohol/week: 1.0 standard drink    Types: 1 Glasses of wine per week    Comment: Occasionally   Drug use: Never   Sexual activity: Not Currently    Birth control/protection: Abstinence  Other Topics Concern   Not on file  Social History Narrative   Lives alone but two of her children live close by. She watches her grandkids everyday and sees her family every day. She also has a puppy. She has not been able to exercise to due her knee and her back.    Social Determinants of Health   Financial Resource Strain: Low Risk    Difficulty of Paying Living Expenses: Not hard at all  Food Insecurity: No Food Insecurity   Worried About Charity fundraiser in the Last Year: Never true    [place] in the Last Year: Never true  Transportation Needs: No Transportation Needs   Lack of Transportation (Medical): No   Lack of Transportation (Non-Medical): No  Physical Activity: Inactive   Days of Exercise per Week: 0 days   Minutes of Exercise per Session: 0 min  Stress: No Stress Concern Present   Feeling of Stress : Not at all  Social Connections: Moderately Integrated   Frequency of Communication with Friends and Family: More than three times a week   Frequency of Social Gatherings with Friends and Family: More than three times a week   Attends Religious Services: More than 4 times per year   Active Member of [employer] or Organizations: Yes   Attends Archivist Meetings: More than 4 times per year   Marital Status: Widowed  Human resources officer Violence: Not At Risk   Fear of Current or Ex-Partner: No   Emotionally Abused: No   Physically Abused: No   Sexually Abused: No    Outpatient Medications Prior to Visit  Medication Sig Dispense Refill   amLODipine (NORVASC) 5 MG tablet Take 1 tablet (5 mg total) by mouth daily. 90 tablet 2   aspirin 325 MG  tablet Take 325 mg by mouth at bedtime.     Barberry-Oreg Grape-Goldenseal (BERBERINE COMPLEX PO) Take by mouth.     Coenzyme Q10 (COQ10) 100 MG CAPS Take by mouth daily.     hydrochlorothiazide (HYDRODIURIL) 12.5 MG tablet Take 1 tablet (12.5 mg total) by mouth daily. 90 tablet 3   omega-3 fish oil (MAXEPA) 1000 MG CAPS capsule Take 1 capsule by mouth daily.     No facility-administered medications prior to visit.    No Known Allergies  ROS Review of Systems    Objective:    Physical Exam  BP 119/65    Pulse 82    SpO2 99%  Wt Readings from Last 3 Encounters:  11/18/21 187 lb (84.8 kg)  05/18/21 181 lb (82.1 kg)  11/18/20 182 lb (82.6 kg)     Health Maintenance Due  Topic Date Due   Zoster Vaccines- Shingrix (1 of 2) Never done   Pneumonia Vaccine 35+ Years old (1 - PCV) Never done   COVID-19  Vaccine (3 - Booster for Pfizer series) 04/09/2020   INFLUENZA VACCINE  Never done    There are no preventive care reminders to display for this patient.  No results found for: TSH Lab Results  Component Value Date   WBC 5.2 11/18/2021   HGB 12.7 11/18/2021   HCT 38.5 11/18/2021   MCV 84.1 11/18/2021   PLT 260 11/18/2021   Lab Results  Component Value Date   NA 140 11/18/2021   K 4.6 11/18/2021   CO2 27 11/18/2021   GLUCOSE 98 11/18/2021   BUN 13 11/18/2021   CREATININE 0.80 11/18/2021   BILITOT 0.4 11/18/2021   AST 14 11/18/2021   ALT 11 11/18/2021   PROT 7.0 11/18/2021   CALCIUM 9.5 11/18/2021   EGFR 76 11/18/2021   Lab Results  Component Value Date   CHOL 271 (H) 11/18/2021   Lab Results  Component Value Date   HDL 50 11/18/2021   Lab Results  Component Value Date   LDLCALC 192 (H) 11/18/2021   Lab Results  Component Value Date   TRIG 134 11/18/2021   Lab Results  Component Value Date   CHOLHDL 5.4 (H) 11/18/2021   No results found for: HGBA1C    Assessment & Plan:  HTN - Blood pressure within normal limits. Patient advised to continue current medications as directed and follow up in 3 months with Dr Zigmund Daniel.   Patient is having labs done today.   Problem List Items Addressed This Visit     Essential hypertension - Primary    No orders of the defined types were placed in this encounter.   Follow-up: Return in about 3 months (around 03/18/2022) for HTN with Dr Zigmund Daniel. Durene Romans, Monico Blitz, [place]

## 2022-01-05 DIAGNOSIS — E7800 Pure hypercholesterolemia, unspecified: Secondary | ICD-10-CM | POA: Diagnosis not present

## 2022-01-06 DIAGNOSIS — E7800 Pure hypercholesterolemia, unspecified: Secondary | ICD-10-CM | POA: Diagnosis not present

## 2022-02-28 DIAGNOSIS — E7800 Pure hypercholesterolemia, unspecified: Secondary | ICD-10-CM | POA: Diagnosis not present

## 2022-05-15 ENCOUNTER — Ambulatory Visit (INDEPENDENT_AMBULATORY_CARE_PROVIDER_SITE_OTHER): Payer: Medicare HMO | Admitting: Family Medicine

## 2022-05-15 DIAGNOSIS — Z0000 Encounter for general adult medical examination without abnormal findings: Secondary | ICD-10-CM | POA: Diagnosis not present

## 2022-05-15 NOTE — Progress Notes (Signed)
MEDICARE ANNUAL WELLNESS VISIT  05/15/2022  Telephone Visit Disclaimer This Medicare AWV was conducted by telephone due to national recommendations for restrictions regarding the COVID-19 Pandemic (e.g. social distancing).  I verified, using two identifiers, that I am speaking with Alicia Herrera or their authorized healthcare agent. I discussed the limitations, risks, security, and privacy concerns of performing an evaluation and management service by telephone and the potential availability of an in-person appointment in the future. The patient expressed understanding and agreed to proceed.  Location of Patient: Home Location of Provider (nurse):  In the office.  Subjective:    Alicia Herrera is a 76 y.o. female patient of [name], DO who had a Medicare Annual Wellness Visit today via telephone. Alicia Herrera is Working part time and lives alone. she has 4 children. she reports that she is socially active and does interact with friends/family regularly. she is minimally physically active and enjoys baking and sewing.  Patient Care Team: [name], DO as PCP - General (Family Medicine)     05/15/2022    3:01 PM 01/17/2021    4:05 PM 04/06/2020   11:51 AM  Advanced Directives  Does Patient Have a Medical Advance Directive? Yes Yes Yes  Type of Advance Directive Living will Healthcare Power of [place];Living will Living will  Does patient want to make changes to medical advance directive? No - Patient declined No - Patient declined No - Patient declined  Copy of Healthcare Power of Attorney in Chart?  No - copy requested     Hospital Utilization Over the Past 12 Months: # of hospitalizations or ER visits: 0 # of surgeries: 0  Review of Systems    Patient reports that her overall health is unchanged compared to last year.  History obtained from chart review and the patient  Patient Reported Readings (BP, Pulse, CBG, Weight, etc) none  Pain Assessment Pain : No/denies  pain     Current Medications & Allergies (verified) Allergies as of 05/15/2022   No Known Allergies      Medication List        Accurate as of May 15, 2022  3:11 PM. If you have any questions, ask your nurse or doctor.          amLODipine 5 MG tablet Commonly known as: NORVASC Take 1 tablet (5 mg total) by mouth daily.   aspirin 325 MG tablet Take 325 mg by mouth at bedtime.   BERBERINE COMPLEX PO Take by mouth.   CoQ10 100 MG Caps Take by mouth daily.   hydrochlorothiazide 12.5 MG tablet Commonly known as: HYDRODIURIL Take 1 tablet (12.5 mg total) by mouth daily.   omega-3 fish oil 1000 MG Caps capsule Commonly known as: MAXEPA Take 1 capsule by mouth daily.   Repatha SureClick 140 MG/ML Soaj Generic drug: Evolocumab Inject into the skin.        History (reviewed): Past Medical History:  Diagnosis Date   Hx of CABG    Rectocele    Uterine prolapse    Past Surgical History:  Procedure Laterality Date   CARDIAC VALVE REPLACEMENT     CORONARY ARTERY BYPASS GRAFT     Family History  Problem Relation Age of Onset   Hypertension Mother    Heart attack Mother    Cancer Father    Penile cancer Father    Diabetes Sister    Social History   Socioeconomic History   Marital status: Widowed    Spouse name: Not on  file   Number of children: 4   Years of education: 17   Highest education level: Bachelor's degree (e.g., BA, AB, BS)  Occupational History   Occupation: Retired   Occupation: Works part time    Comment: 3  mornings at the preschool   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 She watches her grandkids everyday and sees her family every day. She also has a puppy. She has not been able to exercise to due her knees.    Social Determinants of Health   Financial Resource Strain: Low Risk  (05/14/2022)   Overall Financial Resource Strain (CARDIA)    Difficulty of Paying Living Expenses: Not very hard  Food Insecurity: No Food Insecurity (05/14/2022)   Hunger Vital Sign    Worried About Running Out of Food in the Last Year: Never true    Ran Out of Food in the Last Year: Never true  Transportation Needs: No Transportation Needs (05/15/2022)   PRAPARE - Administrator, Civil Service (Medical): No    Lack of Transportation (Non-Medical): No  Recent Concern: Transportation Needs - Unmet Transportation Needs (05/14/2022)   PRAPARE - Transportation    Lack of Transportation (Medical): Yes    Lack of Transportation (Non-Medical): Yes  Physical Activity: Insufficiently Active (05/14/2022)   Exercise Vital Sign    Days of Exercise per Week: 4 days    Minutes of Exercise per Session: 10 Alicia  Stress: No Stress Concern Present (05/14/2022)   [employer] of Occupational Health - Occupational Stress Questionnaire    Feeling of Stress : Only a little  Social Connections: Moderately Integrated (05/15/2022)   Social Connection and Isolation Panel [NHANES]    Frequency of Communication with Friends and Family: More than three times a week    Frequency of Social Gatherings with Friends and Family: Three times a week    Attends Religious Services: More than 4 times per year    Active Member of Clubs or Organizations: Yes    Attends Banker Meetings: More than 4 times per year    Marital Status: Widowed    Activities of Daily Living    05/14/2022    3:43 PM  In your present state of health, do you have any difficulty performing the following  activities:  Hearing? 0  Vision? 0  Difficulty concentrating or making decisions? 0  Walking or climbing stairs? 0  Dressing or bathing? 0  Preparing Food and eating ? N  Using the Toilet? N  In the past six months, have you accidently leaked urine? Y  Do you have problems with loss of bowel control? N  Managing your Medications? N  Managing your Finances? N  Housekeeping or managing your Housekeeping? N    Patient Education/ Literacy How often do you need to have someone help you when you read instructions, pamphlets, or other written materials from your doctor or pharmacy?: 1 -  Never What is the last grade level you completed in school?: Bachelor's degree  Exercise Current Exercise Habits: The patient does not participate in regular exercise at present, Exercise limited by: orthopedic condition(s)  Diet Patient reports consuming 3 meals a day and 1 snack(s) a day Patient reports that her primary diet is: Regular Patient reports that she does have regular access to food.   Depression Screen    05/15/2022    3:04 PM 11/18/2021    8:55 AM 05/18/2021    9:22 AM 01/17/2021    4:06 PM 04/06/2020   11:45 AM  PHQ 2/9 Scores  PHQ - 2 Score 0 0 0 0 0  PHQ- 9 Score    0 3     Fall Risk    05/14/2022    3:43 PM 11/18/2021    8:55 AM 05/18/2021    9:22 AM 01/17/2021    4:06 PM 04/06/2020   11:34 AM  Fall Risk   Falls in the past year? 1 0 0 0 0  Number falls in past yr: 0 0 0 0 0  Injury with Fall? 0 0 0 0 0  Risk for fall due to :  No Fall Risks No Fall Risks No Fall Risks   Follow up  Falls evaluation completed Falls evaluation completed Falls evaluation completed      Objective:  Alicia Herrera seemed alert and oriented and she participated appropriately during our telephone visit.  Blood Pressure Weight BMI  BP Readings from Last 3 Encounters:  12/19/21 119/65  11/18/21 134/70  05/18/21 139/79   Wt Readings from Last 3 Encounters:  11/18/21 187 lb (84.8 kg)  05/18/21 181 lb  (82.1 kg)  11/18/20 182 lb (82.6 kg)   BMI Readings from Last 1 Encounters:  11/18/21 29.38 kg/m    *Unable to obtain current vital signs, weight, and BMI due to telephone visit type  Hearing/Vision  Alicia Herrera did not seem to have difficulty with hearing/understanding during the telephone conversation Reports that she has not had a formal eye exam by an eye care professional within the past year Reports that she has not had a formal hearing evaluation within the past year *Unable to fully assess hearing and vision during telephone visit type  Cognitive Function:    05/15/2022    3:09 PM 01/17/2021    4:21 PM  6CIT Screen  What Year? 0 points 0 points  What month? 0 points 0 points  What time? 0 points 0 points  Count back from 20 0 points 0 points  Months in reverse 0 points 0 points  Repeat phrase 0 points 0 points  Total Score 0 points 0 points   (Normal:0-7, Significant for Dysfunction: >8)  Normal Cognitive Function Screening: Yes   Immunization & Health Maintenance Record Immunization History  Administered Date(s) Administered   PFIZER(Purple Top)SARS-COV-2 Vaccination 01/14/2020, 02/13/2020    Health Maintenance  Topic Date Due   COVID-19 Vaccine (3 - Pfizer series) 05/31/2022 (Originally 04/09/2020)   Zoster Vaccines- Shingrix (1 of 2) 08/15/2022 (Originally 01/20/1995)   Pneumonia Vaccine 77+ Years old (1 - PCV) 02/02/2023 (Originally 01/19/2010)   DEXA SCAN  02/02/2023 (Originally 01/19/2010)   TETANUS/TDAP  02/02/2023 (Originally 01[birth year]20/1965)   Hepatitis C Screening  05/16/2023 (Originally 01/20/1964)   INFLUENZA VACCINE  06/13/2022   HPV VACCINES  Aged Out       Assessment  This is a routine wellness examination for [employer].  Health Maintenance: Due or Overdue There are no preventive  care reminders to display for this patient.   Alicia Herrera does not need a referral for Community Assistance: Care Management:   no Social Work:    no Prescription  Assistance:  no Nutrition/Diabetes Education:  no   Plan:  Personalized Goals  Goals Addressed               This Visit's Progress     Patient Stated (pt-stated)        Get in better shape       Personalized Health Maintenance & Screening Recommendations  Pneumococcal vaccine  Td vaccine Bone densitometry screening Shingrix vaccine  Patient declined the vaccines at this time.  Lung Cancer Screening Recommended: no (Low Dose CT Chest recommended if Age 60-80 years, 30 pack-year currently smoking OR have quit w/in past 15 years) Hepatitis C Screening recommended: yes HIV Screening recommended: no  Advanced Directives: Written information was not prepared per patient's request.  Referrals & Orders No orders of the defined types were placed in this encounter.   Follow-up Plan Follow-up with [name], DO as planned Discuss bone density scan with PCP. Medicare wellness visit in one year.  Patient will access AVS on my chart.   I have personally reviewed and noted the following in the patient's chart:   Medical and social history Use of alcohol, tobacco or illicit drugs  Current medications and supplements Functional ability and status Nutritional status Physical activity Advanced directives List of other physicians Hospitalizations, surgeries, and ER visits in previous 12 months Vitals Screenings to include cognitive, depression, and falls Referrals and appointments  In addition, I have reviewed and discussed with Alicia Herrera certain preventive protocols, quality metrics, and best practice recommendations. A written personalized care plan for preventive services as well as general preventive health recommendations is available and can be mailed to the patient at her request.      Modesto Charon, RN BSN  05/15/2022

## 2022-05-15 NOTE — Patient Instructions (Addendum)
MEDICARE ANNUAL WELLNESS VISIT Health Maintenance Summary and Written Plan of Care  [name] ,  Thank you for allowing me to perform your Medicare Annual Wellness Visit and for your ongoing commitment to your health.   Health Maintenance & Immunization History Health Maintenance  Topic Date Due  . COVID-19 Vaccine (3 - Pfizer series) 05/31/2022 (Originally 04/09/2020)  . Zoster Vaccines- Shingrix (1 of 2) 08/15/2022 (Originally 01/20/1995)  . Pneumonia Vaccine 24+ Years old (1 - PCV) 02/02/2023 (Originally 01/19/2010)  . DEXA SCAN  02/02/2023 (Originally 01/19/2010)  . TETANUS/TDAP  02/02/2023 (Originally 01/20/1964)  . Hepatitis C Screening  05/16/2023 (Originally 01/20/1963)  . INFLUENZA VACCINE  06/13/2022  . HPV VACCINES  Aged Out   Immunization History  Administered Date(s) Administered  . PFIZER(Purple Top)SARS-COV-2 Vaccination 01/14/2020, 02/13/2020    These are the patient goals that we discussed:  Goals Addressed              This Visit's Progress   .  Patient Stated (pt-stated)        Get in better shape        This is a list of Health Maintenance Items that are overdue or due now: Pneumococcal vaccine  Td vaccine Bone densitometry screening Shingrix vaccine  Patient declined the vaccines at this time.  Orders/Referrals Placed Today: No orders of the defined types were placed in this encounter.  (Contact our referral department at [phone] if you have not spoken with someone about your referral appointment within the next 5 days)    Follow-up Plan Follow-up with [name], DO as planned Medicare wellness visit in one year.  Patient will access AVS on my chart.      Health Maintenance, Female Adopting a healthy lifestyle and getting preventive care are important in promoting health and wellness. Ask your health care provider about: The right schedule for you to have regular tests and exams. Things you can do on your own to prevent diseases  and keep yourself healthy. What should I know about diet, weight, and exercise? Eat a healthy diet  Eat a diet that includes plenty of vegetables, fruits, low-fat dairy products, and lean protein. Do not eat a lot of foods that are high in solid fats, added sugars, or sodium. Maintain a healthy weight Body mass index (BMI) is used to identify weight problems. It estimates body fat based on height and weight. Your health care provider can help determine your BMI and help you achieve or maintain a healthy weight. Get regular exercise Get regular exercise. This is one of the most important things you can do for your health. Most adults should: Exercise for at least 150 minutes each week. The exercise should increase your heart rate and make you sweat (moderate-intensity exercise). Do strengthening exercises at least twice a week. This is in addition to the moderate-intensity exercise. Spend less time sitting. Even light physical activity can be beneficial. Watch cholesterol and blood lipids Have your blood tested for lipids and cholesterol at 77 years of age, then have this test every 5 years. Have your cholesterol levels checked more often if: Your lipid or cholesterol levels are high. You are older than 77 years of age. You are at high risk for heart disease. What should I know about cancer screening? Depending on your health history and family history, you may need to have cancer screening at various ages. This may include screening for: Breast cancer. Cervical cancer. Colorectal cancer. Skin cancer. Lung cancer. What should I  know about heart disease, diabetes, and high blood pressure? Blood pressure and heart disease High blood pressure causes heart disease and increases the risk of stroke. This is more likely to develop in people who have high blood pressure readings or are overweight. Have your blood pressure checked: Every 3-5 years if you are 63-66 years of age. Every year if you  are 38 years old or older. Diabetes Have regular diabetes screenings. This checks your fasting blood sugar level. Have the screening done: Once every three years after age 50 if you are at a normal weight and have a low risk for diabetes. More often and at a younger age if you are overweight or have a high risk for diabetes. What should I know about preventing infection? Hepatitis B If you have a higher risk for hepatitis B, you should be screened for this virus. Talk with your health care provider to find out if you are at risk for hepatitis B infection. Hepatitis C Testing is recommended for: Everyone born from [birth year] through 1965. Anyone with known risk factors for hepatitis C. Sexually transmitted infections (STIs) Get screened for STIs, including gonorrhea and chlamydia, if: You are sexually active and are younger than 77 years of age. You are older than 77 years of age and your health care provider tells you that you are at risk for this type of infection. Your sexual activity has changed since you were last screened, and you are at increased risk for chlamydia or gonorrhea. Ask your health care provider if you are at risk. Ask your health care provider about whether you are at high risk for HIV. Your health care provider may recommend a prescription medicine to help prevent HIV infection. If you choose to take medicine to prevent HIV, you should first get tested for HIV. You should then be tested every 3 months for as long as you are taking the medicine. Pregnancy If you are about to stop having your period (premenopausal) and you may become pregnant, seek counseling before you get pregnant. Take 400 to 800 micrograms (mcg) of folic acid every day if you become pregnant. Ask for birth control (contraception) if you want to prevent pregnancy. Osteoporosis and menopause Osteoporosis is a disease in which the bones lose minerals and strength with aging. This can result in bone fractures. If  you are 51 years old or older, or if you are at risk for osteoporosis and fractures, ask your health care provider if you should: Be screened for bone loss. Take a calcium or vitamin D supplement to lower your risk of fractures. Be given hormone replacement therapy (HRT) to treat symptoms of menopause. Follow these instructions at home: Alcohol use Do not drink alcohol if: Your health care provider tells you not to drink. You are pregnant, may be pregnant, or are planning to become pregnant. If you drink alcohol: Limit how much you have to: 0-1 drink a day. Know how much alcohol is in your drink. In the [place] one drink equals one 12 oz bottle of beer (355 mL), one 5 oz glass of wine (148 mL), or one 1 oz glass of hard liquor (44 mL). Lifestyle Do not use any products that contain nicotine or tobacco. These products include cigarettes, chewing tobacco, and vaping devices, such as e-cigarettes. If you need help quitting, ask your health care provider. Do not use street drugs. Do not share needles. Ask your health care provider for help if you need support or information about quitting drugs.  General instructions Schedule regular health, dental, and eye exams. Stay current with your vaccines. Tell your health care provider if: You often feel depressed. You have ever been abused or do not feel safe at home. Summary Adopting a healthy lifestyle and getting preventive care are important in promoting health and wellness. Follow your health care provider's instructions about healthy diet, exercising, and getting tested or screened for diseases. Follow your health care provider's instructions on monitoring your cholesterol and blood pressure. This information is not intended to replace advice given to you by your health care provider. Make sure you discuss any questions you have with your health care provider. Document Revised: 03/21/2021 Document Reviewed: 03/21/2021 Elsevier Patient Education   [place].

## 2022-05-18 ENCOUNTER — Ambulatory Visit (INDEPENDENT_AMBULATORY_CARE_PROVIDER_SITE_OTHER): Payer: Medicare HMO | Admitting: Family Medicine

## 2022-05-18 ENCOUNTER — Encounter: Payer: Self-pay | Admitting: Family Medicine

## 2022-05-18 DIAGNOSIS — I10 Essential (primary) hypertension: Secondary | ICD-10-CM | POA: Diagnosis not present

## 2022-05-18 DIAGNOSIS — E782 Mixed hyperlipidemia: Secondary | ICD-10-CM

## 2022-05-18 NOTE — Progress Notes (Signed)
Alicia Herrera - 76 y.o. female MRN [id number]  Date of birth: [birth year]  Subjective Chief Complaint  Patient presents with   Hypertension   Hyperlipidemia    HPI Alicia Herrera is a 76 y.o. female here today for follow up visit.    BP remains well controlled. Continues on amlodipine and HCTZ.  She is tolerating these well without side effects.  She has not had chest pain, shortness of breath, palpitations, headache or vision changes.   She continues to see cardiology due to history of CABG.  Started on repatha due to statin intolerance.  Reports knee pain after starting this.  Lipids have improved nicely since starting Repatha.     ROS:  A comprehensive ROS was completed and negative except as noted per HPI  No Known Allergies  Past Medical History:  Diagnosis Date   Hx of CABG    Rectocele    Uterine prolapse     Past Surgical History:  Procedure Laterality Date   CARDIAC VALVE REPLACEMENT     CORONARY ARTERY BYPASS GRAFT      Social History   Socioeconomic History   Marital status: Widowed    Spouse name: Not on file   Number of children: 4   Years of education: 17   Highest education level: Bachelor's degree (e.g., BA, AB, BS)  Occupational History   Occupation: Retired   Occupation: Works part time    Comment: 3  mornings at the preschool   Occupation: Words part-time at [employer]  Tobacco Use   Smoking status: Never   Smokeless tobacco: Never  Vaping Use   Vaping Use: Never used  Substance and Sexual Activity   Alcohol use: Yes    Alcohol/week: 1.0 standard drink of alcohol    Types: 1 Glasses of wine per week    Comment: Occasionally   Drug use: Never   Sexual activity: Not Currently    Birth control/protection: Abstinence  Other Topics Concern   Not on file  Social History Narrative   Lives alone but two of her children live close by. She watches her grandkids everyday and sees her family every day. She also has a puppy. She has not been able to  exercise to due her knees.    Social Determinants of Health   Financial Resource Strain: Low Risk  (05/14/2022)   Overall Financial Resource Strain (CARDIA)    Difficulty of Paying Living Expenses: Not very hard  Food Insecurity: No Food Insecurity (05/14/2022)   Hunger Vital Sign    Worried About Running Out of Food in the Last Year: Never true    Ran Out of Food in the Last Year: Never true  Transportation Needs: No Transportation Needs (05/15/2022)   PRAPARE - Administrator, Civil Service (Medical): No    Lack of Transportation (Non-Medical): No  Recent Concern: Transportation Needs - Unmet Transportation Needs (05/14/2022)   PRAPARE - Transportation    Lack of Transportation (Medical): Yes    Lack of Transportation (Non-Medical): Yes  Physical Activity: Insufficiently Active (05/14/2022)   Exercise Vital Sign    Days of Exercise per Week: 4 days    Minutes of Exercise per Session: 10 min  Stress: No Stress Concern Present (05/14/2022)   [employer] of Occupational Health - Occupational Stress Questionnaire    Feeling of Stress : Only a little  Social Connections: Moderately Integrated (05/15/2022)   Social Connection and Isolation Panel [NHANES]    Frequency of Communication with Friends and  Family: More than three times a week    Frequency of Social Gatherings with Friends and Family: Three times a week    Attends Religious Services: More than 4 times per year    Active Member of Clubs or Organizations: Yes    Attends Banker Meetings: More than 4 times per year    Marital Status: Widowed    Family History  Problem Relation Age of Onset   Hypertension Mother    Heart attack Mother    Cancer Father    Penile cancer Father    Diabetes Sister     Health Maintenance  Topic Date Due   COVID-19 Vaccine (3 - Pfizer series) 05/31/2022 (Originally 04/09/2020)   Zoster Vaccines- Shingrix (1 of 2) 08/15/2022 (Originally 01/20/1995)   Pneumonia Vaccine 33+  Years old (1 - PCV) 02/02/2023 (Originally 01/19/2010)   DEXA SCAN  02/02/2023 (Originally 01/19/2010)   TETANUS/TDAP  02/02/2023 (Originally 01/20/1964)   Hepatitis C Screening  05/16/2023 (Originally 01/20/1963)   INFLUENZA VACCINE  06/13/2022   HPV VACCINES  Aged Out     ----------------------------------------------------------------------------------------------------------------------------------------------------------------------------------------------------------------- Physical Exam BP 116/75 (BP Location: Left Arm, Patient Position: Sitting, Cuff Size: Large)   Pulse 68   Ht 5' 6.9" (1.699 m)   Wt 181 lb (82.1 kg)   SpO2 98%   BMI 28.43 kg/m   Physical Exam Constitutional:      Appearance: Normal appearance.  Eyes:     General: No scleral icterus. Cardiovascular:     Rate and Rhythm: Normal rate and regular rhythm.  Pulmonary:     Effort: Pulmonary effort is normal.     Breath sounds: Normal breath sounds.  Musculoskeletal:     Cervical back: Neck supple.  Neurological:     General: No focal deficit present.     Mental Status: She is alert.  Psychiatric:        Mood and Affect: Mood normal.        Behavior: Behavior normal.     ------------------------------------------------------------------------------------------------------------------------------------------------------------------------------------------------------------------- Assessment and Plan  Essential hypertension BP remains well controlled at this time.  Continue on current medications for management of HTN.   Hyperlipidemia Thinks repatha may be causing knee pain.  She will discuss this with her cardiologist at her next visit.    No orders of the defined types were placed in this encounter.   No follow-ups on file.    This visit occurred during the SARS-CoV-2 public health emergency.  Safety protocols were in place, including screening questions prior to the visit, additional usage of  staff PPE, and extensive cleaning of exam room while observing appropriate contact time as indicated for disinfecting solutions.

## 2022-05-18 NOTE — Assessment & Plan Note (Signed)
BP remains well controlled at this time.  Continue on current medications for management of HTN.

## 2022-05-18 NOTE — Assessment & Plan Note (Signed)
Thinks repatha may be causing knee pain.  She will discuss this with her cardiologist at her next visit.

## 2022-05-18 NOTE — Patient Instructions (Signed)
Great to see you today! Follow up with me in 6 months.

## 2022-07-06 ENCOUNTER — Ambulatory Visit (INDEPENDENT_AMBULATORY_CARE_PROVIDER_SITE_OTHER): Payer: Medicare HMO

## 2022-07-06 ENCOUNTER — Ambulatory Visit (INDEPENDENT_AMBULATORY_CARE_PROVIDER_SITE_OTHER): Payer: Medicare HMO | Admitting: Sports Medicine

## 2022-07-06 DIAGNOSIS — M170 Bilateral primary osteoarthritis of knee: Secondary | ICD-10-CM

## 2022-07-06 NOTE — Progress Notes (Signed)
    Procedures performed today:    Procedure: Real-time Ultrasound Guided aspiration/injection of the right knee Device: Samsung HS60  Verbal informed consent obtained.  Time-out conducted.  Noted no overlying erythema, induration, or other signs of local infection.  Skin prepped in a sterile fashion.  Local anesthesia: Topical Ethyl chloride.  With sterile technique and under real time ultrasound guidance: Using 18-gauge needle aspirated 30 mils of clear, straw-colored fluid, syringe switched and 1 cc Kenalog 40, 2 cc lidocaine, 2 cc bupivacaine injected easily Completed without difficulty  Advised to call if fevers/chills, erythema, induration, drainage, or persistent bleeding.  Images permanently stored and available for review in PACS.  Impression: Technically successful ultrasound guided aspiration/injection.  Procedure: Real-time Ultrasound Guided injection of the left knee Device: Samsung HS60  Verbal informed consent obtained.  Time-out conducted.  Noted no overlying erythema, induration, or other signs of local infection.  Skin prepped in a sterile fashion.  Local anesthesia: Topical Ethyl chloride.  With sterile technique and under real time ultrasound guidance: Noted moderate effusion, 1 cc Kenalog 40, 2 cc lidocaine, 2 cc bupivacaine injected easily Completed without difficulty  Advised to call if fevers/chills, erythema, induration, drainage, or persistent bleeding.  Images permanently stored and available for review in PACS.  Impression: Technically successful ultrasound guided injection.  Independent interpretation of notes and tests performed by another provider:   None.  Brief History, Exam, Impression, and Recommendations:    Primary osteoarthritis of both knees This is a very pleasant 76 year old female, she has long history of bilateral knee pain, as well as pain radiating to the legs, historically thought to be related to her statin, switched off of a statin  and placed on Repatha, unfortunately continues to have knee and leg pain on Repatha. X-rays 2 years ago did show severe osteoarthritis. She has tried over-the-counter analgesics, as well as topical agents without sufficient improvement. Today we drained her right knee, and injected her right and left knees. Adding home physical therapy, updated x-rays. I think we can probably stop Repatha and restart the statin, but I will leave this up to her PCP/cardiologist. Return to see me in 6 weeks.    ____________________________________________ Ihor Austin. Benjamin Stain, M.D., ABFM., CAQSM., AME. Primary Care and Sports Medicine [facility] [facility]  Adjunct Professor of Family Medicine  [place] of [facility] of Medicine  Restaurant manager, fast food

## 2022-07-06 NOTE — Assessment & Plan Note (Addendum)
This is a very pleasant 76 year old female, she has long history of bilateral knee pain, as well as pain radiating to the legs, historically thought to be related to her statin, switched off of a statin and placed on Repatha, unfortunately continues to have knee and leg pain on Repatha. X-rays 2 years ago did show severe osteoarthritis. She has tried over-the-counter analgesics, as well as topical agents without sufficient improvement. Today we drained her right knee, and injected her right and left knees. Adding home physical therapy, updated x-rays. I think we can probably stop Repatha and restart the statin, but I will leave this up to her PCP/cardiologist. Return to see me in 6 weeks.

## 2022-08-01 ENCOUNTER — Ambulatory Visit (INDEPENDENT_AMBULATORY_CARE_PROVIDER_SITE_OTHER): Payer: Medicare HMO | Admitting: Sports Medicine

## 2022-08-01 ENCOUNTER — Telehealth: Payer: Self-pay | Admitting: Sports Medicine

## 2022-08-01 DIAGNOSIS — I2510 Atherosclerotic heart disease of native coronary artery without angina pectoris: Secondary | ICD-10-CM | POA: Diagnosis not present

## 2022-08-01 DIAGNOSIS — Z8679 Personal history of other diseases of the circulatory system: Secondary | ICD-10-CM | POA: Diagnosis not present

## 2022-08-01 DIAGNOSIS — Z951 Presence of aortocoronary bypass graft: Secondary | ICD-10-CM | POA: Diagnosis not present

## 2022-08-01 DIAGNOSIS — E782 Mixed hyperlipidemia: Secondary | ICD-10-CM | POA: Diagnosis not present

## 2022-08-01 DIAGNOSIS — Z952 Presence of prosthetic heart valve: Secondary | ICD-10-CM | POA: Diagnosis not present

## 2022-08-01 DIAGNOSIS — I10 Essential (primary) hypertension: Secondary | ICD-10-CM | POA: Diagnosis not present

## 2022-08-01 DIAGNOSIS — Z9889 Other specified postprocedural states: Secondary | ICD-10-CM | POA: Diagnosis not present

## 2022-08-01 DIAGNOSIS — M170 Bilateral primary osteoarthritis of knee: Secondary | ICD-10-CM | POA: Diagnosis not present

## 2022-08-01 DIAGNOSIS — I7121 Aneurysm of the ascending aorta, without rupture: Secondary | ICD-10-CM | POA: Diagnosis not present

## 2022-08-01 MED ORDER — TRAMADOL HCL 50 MG PO TABS: 50.0000 mg | ORAL_TABLET | Freq: Three times a day (TID) | ORAL | 0 refills | Status: DC | PRN

## 2022-08-01 NOTE — Telephone Encounter (Signed)
Visco approval please, bilateral x-ray confirmed osteoarthritis, failed analgesics and steroid injections, home conditioning for greater than 6 weeks.

## 2022-08-01 NOTE — Assessment & Plan Note (Signed)
Alicia Herrera returns, she has bilateral knee pain, we did aspirations and injections at the last visit, she had some relief for several weeks but unfortunately has had a recurrence of pain, she does have x-rays that show severe osteoarthritis. She has thus failed greater than 6 weeks of physician directed conservative treatment. We will go ahead and get her approved for viscosupplementation and add tramadol as Tylenol is not cutting it. Return to start Visco when approved. I did discuss knee replacement with her considering severe end-stage osteoarthritis, but she is not interested at this juncture.

## 2022-08-01 NOTE — Progress Notes (Signed)
    Procedures performed today:    None.  Independent interpretation of notes and tests performed by another provider:   None.  Brief History, Exam, Impression, and Recommendations:    Primary osteoarthritis of both knees Alicia Herrera returns, she has bilateral knee pain, we did aspirations and injections at the last visit, she had some relief for several weeks but unfortunately has had a recurrence of pain, she does have x-rays that show severe osteoarthritis. She has thus failed greater than 6 weeks of physician directed conservative treatment. We will go ahead and get her approved for viscosupplementation and add tramadol as Tylenol is not cutting it. Return to start Visco when approved. I did discuss knee replacement with her considering severe end-stage osteoarthritis, but she is not interested at this juncture.    ____________________________________________ Gwen Her. Dianah Field, M.D., ABFM., CAQSM., AME. Primary Care and Sports Medicine [place] [facility]  Adjunct Professor of [place] of [facility] of Medicine  Risk manager

## 2022-08-02 NOTE — Telephone Encounter (Signed)
MyVisco paperwork faxed to [employer] at [phone] Request is for Orthovisc Pt's insurance prefers Orthovisc Fax confirmation receipt received  

## 2022-08-09 ENCOUNTER — Other Ambulatory Visit: Payer: Self-pay

## 2022-08-09 DIAGNOSIS — M170 Bilateral primary osteoarthritis of knee: Secondary | ICD-10-CM

## 2022-08-09 MED ORDER — TRAMADOL HCL 50 MG PO TABS: 50.0000 mg | ORAL_TABLET | Freq: Three times a day (TID) | ORAL | 0 refills | Status: DC | PRN

## 2022-08-09 NOTE — Telephone Encounter (Signed)
Patient called to state that the tramadol is helping a lot more than the tylenol. She would like a month's supply called in.

## 2022-08-09 NOTE — Telephone Encounter (Signed)
Benefits Investigation Details received from [employer] Injection: Orthovisc  Medical: Deductible does not apply. Once the OOP has been met, pt is covered at 100%. Only one copay apples per date of service.  PA required: No  Pharmacy: Products not covered under pharmacy plan.   Specialty Pharmacy: [facility] fill through: Sharyn L[facility] and [place] Copay/Coinsurance: $15 Product Copay:  Administration Coinsurance:  Administration Copay: $20 Deductible:  Out of Pocket Max: $3400 (met: $41.62)    Patient's approx cost per injection is $40. Patient aware of her cost and wishes to proceed with the injections. Call transferred tot he front desk for scheduling.

## 2022-08-10 DIAGNOSIS — E782 Mixed hyperlipidemia: Secondary | ICD-10-CM | POA: Diagnosis not present

## 2022-08-10 DIAGNOSIS — I2510 Atherosclerotic heart disease of native coronary artery without angina pectoris: Secondary | ICD-10-CM | POA: Diagnosis not present

## 2022-08-17 ENCOUNTER — Other Ambulatory Visit: Payer: Self-pay | Admitting: Family Medicine

## 2022-08-18 ENCOUNTER — Ambulatory Visit (INDEPENDENT_AMBULATORY_CARE_PROVIDER_SITE_OTHER): Payer: Medicare HMO

## 2022-08-18 ENCOUNTER — Ambulatory Visit: Payer: Medicare HMO | Admitting: Sports Medicine

## 2022-08-18 DIAGNOSIS — M170 Bilateral primary osteoarthritis of knee: Secondary | ICD-10-CM

## 2022-08-18 MED ORDER — TRAMADOL HCL 50 MG PO TABS: 50.0000 mg | ORAL_TABLET | Freq: Three times a day (TID) | ORAL | 3 refills | Status: DC | PRN

## 2022-08-18 NOTE — Addendum Note (Signed)
Addended by: Silverio Decamp on: 08/18/2022 09:02 AM   Modules accepted: Orders

## 2022-08-18 NOTE — Assessment & Plan Note (Addendum)
Severe end-stage knee osteoarthritis, we did a bilateral Orthovisc injection, aspirated the right side. Return in 1 week for #2 of 4. Tramadol and cryotherapy seem to help, refilling tramadol for use 3 times daily.

## 2022-08-18 NOTE — Progress Notes (Addendum)
    Procedures performed today:    Procedure: Real-time Ultrasound Guided aspiration/injection of the right knee Device: Samsung HS60  Verbal informed consent obtained.  Time-out conducted.  Noted no overlying erythema, induration, or other signs of local infection.  Skin prepped in a sterile fashion.  Local anesthesia: Topical Ethyl chloride.  With sterile technique and under real time ultrasound guidance: Using 18-gauge needle aspirated 40 mL of clear, straw-colored fluid, syringe switched and 30 mg/2 mL of OrthoVisc (sodium hyaluronate) in a prefilled syringe was injected easily into the knee through an 18-gauge needle. Completed without difficulty  Advised to call if fevers/chills, erythema, induration, drainage, or persistent bleeding.  Images permanently stored and available for review in PACS.  Impression: Technically successful ultrasound guided injection.  Procedure: Real-time Ultrasound Guided injection of the left knee Device: Samsung HS60  Verbal informed consent obtained.  Time-out conducted.  Noted no overlying erythema, induration, or other signs of local infection.  Skin prepped in a sterile fashion.  Local anesthesia: Topical Ethyl chloride.  With sterile technique and under real time ultrasound guidance: Noted mild effusion, 30 mg/2 mL of OrthoVisc (sodium hyaluronate) in a prefilled syringe was injected easily into the knee through a 22-gauge needle. Completed without difficulty  Advised to call if fevers/chills, erythema, induration, drainage, or persistent bleeding.  Images permanently stored and available for review in PACS.  Impression: Technically successful ultrasound guided injection.  Independent interpretation of notes and tests performed by another provider:   None.  Brief History, Exam, Impression, and Recommendations:    Primary osteoarthritis of both knees Severe end-stage knee osteoarthritis, we did a bilateral Orthovisc injection, aspirated the  right side. Return in 1 week for #2 of 4. Tramadol and cryotherapy seem to help, refilling tramadol for use 3 times daily.    ____________________________________________ Gwen Her. Dianah Field, M.D., ABFM., CAQSM., AME. Primary Care and Sports Medicine [place] [facility]  Adjunct Professor of [place] of [facility] of Medicine  Risk manager

## 2022-08-25 ENCOUNTER — Ambulatory Visit: Payer: Medicare HMO | Admitting: Sports Medicine

## 2022-08-25 ENCOUNTER — Ambulatory Visit (INDEPENDENT_AMBULATORY_CARE_PROVIDER_SITE_OTHER): Payer: Medicare HMO

## 2022-08-25 DIAGNOSIS — M170 Bilateral primary osteoarthritis of knee: Secondary | ICD-10-CM | POA: Diagnosis not present

## 2022-08-25 NOTE — Progress Notes (Signed)
    Procedures performed today:    Procedure: Real-time Ultrasound Guided aspiration/injection of the right knee Device: Samsung HS60  Verbal informed consent obtained.  Time-out conducted.  Noted no overlying erythema, induration, or other signs of local infection.  Skin prepped in a sterile fashion.  Local anesthesia: Topical Ethyl chloride.  With sterile technique and under real time ultrasound guidance: Using 18-gauge needle aspirated 30 mL of clear, straw-colored fluid, syringe switched and 30 mg/2 mL of OrthoVisc (sodium hyaluronate) in a prefilled syringe was injected easily into the knee through an 18-gauge needle. Completed without difficulty  Advised to call if fevers/chills, erythema, induration, drainage, or persistent bleeding.  Images permanently stored and available for review in PACS.  Impression: Technically successful ultrasound guided aspiration/injection.   Procedure: Real-time Ultrasound Guided injection of the left knee Device: Samsung HS60  Verbal informed consent obtained.  Time-out conducted.  Noted no overlying erythema, induration, or other signs of local infection.  Skin prepped in a sterile fashion.  Local anesthesia: Topical Ethyl chloride.  With sterile technique and under real time ultrasound guidance: Noted mild effusion, 30 mg/2 mL of OrthoVisc (sodium hyaluronate) in a prefilled syringe was injected easily into the knee through a 22-gauge needle. Completed without difficulty  Advised to call if fevers/chills, erythema, induration, drainage, or persistent bleeding.  Images permanently stored and available for review in PACS.  Impression: Technically successful ultrasound guided injection.  Independent interpretation of notes and tests performed by another provider:   None.  Brief History, Exam, Impression, and Recommendations:    Primary osteoarthritis of both knees Severe end-stage knee osteoarthritis, today we did Orthovisc injection 2 of 4  bilaterally, return in 1 week for #3 of 4, tramadol and cryotherapy also continue to help.    ____________________________________________ Gwen Her. Dianah Field, M.D., ABFM., CAQSM., AME. Primary Care and Sports Medicine [facility]  Adjunct Professor of [place] [employer] [facility] of Medicine  Risk manager

## 2022-08-25 NOTE — Assessment & Plan Note (Signed)
Severe end-stage knee osteoarthritis, today we did Orthovisc injection 2 of 4 bilaterally, return in 1 week for #3 of 4, tramadol and cryotherapy also continue to help.

## 2022-09-01 ENCOUNTER — Ambulatory Visit (INDEPENDENT_AMBULATORY_CARE_PROVIDER_SITE_OTHER): Payer: Medicare HMO

## 2022-09-01 ENCOUNTER — Ambulatory Visit (INDEPENDENT_AMBULATORY_CARE_PROVIDER_SITE_OTHER): Payer: Medicare HMO | Admitting: Sports Medicine

## 2022-09-01 DIAGNOSIS — M170 Bilateral primary osteoarthritis of knee: Secondary | ICD-10-CM | POA: Diagnosis not present

## 2022-09-01 NOTE — Progress Notes (Signed)
    Procedures performed today:    Procedure: Real-time Ultrasound Guided aspiration/injection of the right knee Device: Samsung HS60  Verbal informed consent obtained.  Time-out conducted.  Noted no overlying erythema, induration, or other signs of local infection.  Skin prepped in a sterile fashion.  Local anesthesia: Topical Ethyl chloride.  With sterile technique and under real time ultrasound guidance: Using 18-gauge needle aspirated 40 mL of clear, straw-colored fluid, syringe switched and 30 mg/2 mL of OrthoVisc (sodium hyaluronate) in a prefilled syringe was injected easily into the knee through an 18-gauge needle. Completed without difficulty  Advised to call if fevers/chills, erythema, induration, drainage, or persistent bleeding.  Images permanently stored and available for review in PACS.  Impression: Technically successful ultrasound guided aspiration/injection.   Procedure: Real-time Ultrasound Guided injection of the left knee Device: Samsung HS60  Verbal informed consent obtained.  Time-out conducted.  Noted no overlying erythema, induration, or other signs of local infection.  Skin prepped in a sterile fashion.  Local anesthesia: Topical Ethyl chloride.  With sterile technique and under real time ultrasound guidance: Noted mild effusion, 30 mg/2 mL of OrthoVisc (sodium hyaluronate) in a prefilled syringe was injected easily into the knee through a 22-gauge needle. Completed without difficulty  Advised to call if fevers/chills, erythema, induration, drainage, or persistent bleeding.  Images permanently stored and available for review in PACS.  Impression: Technically successful ultrasound guided injection.  Independent interpretation of notes and tests performed by another provider:   None.  Brief History, Exam, Impression, and Recommendations:    Primary osteoarthritis of both knees Severe end-stage knee osteoarthritis, Orthovisc 3 of 4 today, return in 1 week  for #4 of 4. Tramadol and cryotherapy also continue to help.    ____________________________________________ Gwen Her. Dianah Field, M.D., ABFM., CAQSM., AME. Primary Care and Sports Medicine [facility]  Adjunct Professor of [place] of [facility] of Medicine  Risk manager

## 2022-09-01 NOTE — Assessment & Plan Note (Signed)
Severe end-stage knee osteoarthritis, Orthovisc 3 of 4 today, return in 1 week for #4 of 4. Tramadol and cryotherapy also continue to help.

## 2022-09-08 ENCOUNTER — Ambulatory Visit (INDEPENDENT_AMBULATORY_CARE_PROVIDER_SITE_OTHER): Payer: Medicare HMO

## 2022-09-08 ENCOUNTER — Ambulatory Visit: Payer: Medicare HMO | Admitting: Sports Medicine

## 2022-09-08 DIAGNOSIS — M170 Bilateral primary osteoarthritis of knee: Secondary | ICD-10-CM

## 2022-09-08 NOTE — Assessment & Plan Note (Signed)
This is a very pleasant 76 year old female with severe end-stage knee osteoarthritis, today we did Orthovisc 4 4 into both knees, still having significant pain. Will increase tramadol to 2 tabs 3 times a day and I am happy to refill this when she runs out, in 4 weeks if still having pain we will bump her up to hydrocodone and refer her to Dr. Berenice Primas.

## 2022-09-08 NOTE — Progress Notes (Signed)
    Procedures performed today:    Procedure: Real-time Ultrasound Guided aspiration/injection of the right knee Device: Samsung HS60  Verbal informed consent obtained.  Time-out conducted.  Noted no overlying erythema, induration, or other signs of local infection.  Skin prepped in a sterile fashion.  Local anesthesia: Topical Ethyl chloride.  With sterile technique and under real time ultrasound guidance: Using 18-gauge needle aspirated 40 mL of clear, straw-colored fluid, syringe switched and 30 mg/2 mL of OrthoVisc (sodium hyaluronate) in a prefilled syringe was injected easily into the knee through an 18-gauge needle. Completed without difficulty  Advised to call if fevers/chills, erythema, induration, drainage, or persistent bleeding.  Images permanently stored and available for review in PACS.  Impression: Technically successful ultrasound guided aspiration/injection.   Procedure: Real-time Ultrasound Guided injection of the left knee Device: Samsung HS60  Verbal informed consent obtained.  Time-out conducted.  Noted no overlying erythema, induration, or other signs of local infection.  Skin prepped in a sterile fashion.  Local anesthesia: Topical Ethyl chloride.  With sterile technique and under real time ultrasound guidance: Noted mild effusion, 30 mg/2 mL of OrthoVisc (sodium hyaluronate) in a prefilled syringe was injected easily into the knee through a 22-gauge needle. Completed without difficulty  Advised to call if fevers/chills, erythema, induration, drainage, or persistent bleeding.  Images permanently stored and available for review in PACS.  Impression: Technically successful ultrasound guided injection.  Independent interpretation of notes and tests performed by another provider:   None.  Brief History, Exam, Impression, and Recommendations:    Primary osteoarthritis of both knees This is a very pleasant 76 year old female with severe end-stage knee  osteoarthritis, today we did Orthovisc 4 4 into both knees, still having significant pain. Will increase tramadol to 2 tabs 3 times a day and I am happy to refill this when she runs out, in 4 weeks if still having pain we will bump her up to hydrocodone and refer her to Dr. Berenice Primas.    ____________________________________________ Gwen Her. Dianah Field, M.D., ABFM., CAQSM., AME. Primary Care and Sports Medicine [facility]  Adjunct Professor of [place] of [facility] of Medicine  Risk manager

## 2022-10-09 ENCOUNTER — Telehealth (INDEPENDENT_AMBULATORY_CARE_PROVIDER_SITE_OTHER): Payer: Medicare HMO | Admitting: Sports Medicine

## 2022-10-09 DIAGNOSIS — M170 Bilateral primary osteoarthritis of knee: Secondary | ICD-10-CM | POA: Diagnosis not present

## 2022-10-09 MED ORDER — HYDROCODONE-ACETAMINOPHEN 7.5-325 MG PO TABS: 1.0000 | ORAL_TABLET | Freq: Three times a day (TID) | ORAL | 0 refills | Status: DC | PRN

## 2022-10-09 NOTE — Assessment & Plan Note (Signed)
Pleasant 76 year old female, severe end-stage knee osteoarthritis, has had steroid injections, went through a full series of Orthovisc, tramadol 100 mg 3 times daily, home physical therapy, still having discomfort, increasing pain medication to hydrocodone 7.5 and I would like a surgical consultation with Dr. Luiz Blare. I am happy to refill the hydrocodone if and when she needs preop.

## 2022-10-09 NOTE — Progress Notes (Signed)
   Virtual Visit via Telephone   I connected with  Alicia Herrera  on 10/09/22 by telephone/telehealth and verified that I am speaking with the correct person using two identifiers.   I discussed the limitations, risks, security and privacy concerns of performing an evaluation and management service by telephone, including the higher likelihood of inaccurate diagnosis and treatment, and the availability of in person appointments.  We also discussed the likely need of an additional face to face encounter for complete and high quality delivery of care.  I also discussed with the patient that there may be a patient responsible charge related to this service. The patient expressed understanding and wishes to proceed.  Provider location is in medical facility. Patient location is at their home, different from provider location. People involved in care of the patient during this telehealth encounter were myself, my nurse/medical assistant, and my front office/scheduling team member.  Review of Systems: No fevers, chills, night sweats, weight loss, chest pain, or shortness of breath.   Objective Findings:    General: Speaking full sentences, no audible heavy breathing.  Sounds alert and appropriately interactive.    Independent interpretation of tests performed by another provider:   None.  Brief History, Exam, Impression, and Recommendations:    Primary osteoarthritis of both knees Pleasant 76 year old female, severe end-stage knee osteoarthritis, has had steroid injections, went through a full series of Orthovisc, tramadol 100 mg 3 times daily, home physical therapy, still having discomfort, increasing pain medication to hydrocodone 7.5 and I would like a surgical consultation with Dr. Luiz Blare. I am happy to refill the hydrocodone if and when she needs preop.   I discussed the above assessment and treatment plan with the patient. The patient was provided an opportunity to ask questions and all  were answered. The patient agreed with the plan and demonstrated an understanding of the instructions.   The patient was advised to call back or seek an in-person evaluation if the symptoms worsen or if the condition fails to improve as anticipated.   I provided 30 minutes of verbal and non-verbal time during this encounter date, time was needed to gather information, review chart, records, communicate/coordinate with staff remotely, as well as complete documentation.   ____________________________________________ Ihor Austin. Benjamin Stain, M.D., ABFM., CAQSM., AME. Primary Care and Sports Medicine [facility]  Adjunct Professor of Family Medicine  [place] [employer] [facility] of Medicine  Restaurant manager, fast food

## 2022-10-19 DIAGNOSIS — M170 Bilateral primary osteoarthritis of knee: Secondary | ICD-10-CM | POA: Diagnosis not present

## 2022-10-20 ENCOUNTER — Telehealth: Payer: Self-pay

## 2022-10-20 ENCOUNTER — Other Ambulatory Visit: Payer: Self-pay

## 2022-10-20 DIAGNOSIS — M170 Bilateral primary osteoarthritis of knee: Secondary | ICD-10-CM

## 2022-10-20 NOTE — Telephone Encounter (Signed)
Pt states Pre-Op documentation is forthcoming. Unsure whether an appointment will be required.   Please advise.

## 2022-10-20 NOTE — Telephone Encounter (Signed)
Patient states she needs refill on hydrocodone cause she has enough to last her until wed please refill order pended.

## 2022-10-22 MED ORDER — HYDROCODONE-ACETAMINOPHEN 7.5-325 MG PO TABS: 1.0000 | ORAL_TABLET | Freq: Three times a day (TID) | ORAL | 0 refills | Status: DC | PRN

## 2022-10-26 NOTE — Telephone Encounter (Signed)
Patient stated Dr. Jodi Geralds of [facility] should be faxing Pre-Op docs. Date not scheduled for procedure prior to clearance. Sending EPIC note to [facility] for docs.

## 2022-10-26 NOTE — Telephone Encounter (Signed)
She will need to have paperwork sent from surgeons office and then we can schedule for pre-op.

## 2022-10-27 ENCOUNTER — Telehealth: Payer: Self-pay

## 2022-10-27 NOTE — Telephone Encounter (Signed)
LVM for [facility] scheduler: Emilio Aspen [phone]).

## 2022-11-02 ENCOUNTER — Telehealth: Payer: Self-pay

## 2022-11-02 DIAGNOSIS — M170 Bilateral primary osteoarthritis of knee: Secondary | ICD-10-CM

## 2022-11-02 MED ORDER — HYDROCODONE-ACETAMINOPHEN 7.5-325 MG PO TABS: 1.0000 | ORAL_TABLET | Freq: Three times a day (TID) | ORAL | 0 refills | Status: DC | PRN

## 2022-11-02 NOTE — Telephone Encounter (Signed)
Patient left VM and states she is still waiting on the surgeon and she would like some more hydrocodone sent in to get her through the holidays please advise.

## 2022-11-02 NOTE — Telephone Encounter (Signed)
Received pt's pre-op documents. Pt scheduled for pre-op clearance

## 2022-11-02 NOTE — Telephone Encounter (Signed)
Patient notified

## 2022-11-02 NOTE — Addendum Note (Signed)
Addended by: Monica Becton on: 11/02/2022 03:33 PM   Modules accepted: Orders

## 2022-11-02 NOTE — Telephone Encounter (Signed)
Refilling hydrocodone, I did send a referral to [facility] orthopedics, if they have not heard back have them just call this number to schedule directly: [phone] .

## 2022-11-11 ENCOUNTER — Telehealth: Payer: Self-pay | Admitting: Family Medicine

## 2022-11-14 NOTE — Telephone Encounter (Signed)
Called patient whom stated she has a schedule appointment 11/20/2022 already but she will be having a knee replacement and might have to push that out. She will call us to keep [place] posted.

## 2022-11-14 NOTE — Telephone Encounter (Signed)
Please contact the patient to schedule 65-month hypertension follow-up. Medication refilled for 90 days.

## 2022-11-20 ENCOUNTER — Encounter: Payer: Self-pay | Admitting: Family Medicine

## 2022-11-20 ENCOUNTER — Ambulatory Visit (INDEPENDENT_AMBULATORY_CARE_PROVIDER_SITE_OTHER): Payer: Medicare HMO | Admitting: Family Medicine

## 2022-11-20 VITALS — BP 117/76 | HR 84 | Ht 66.0 in | Wt 180.1 lb

## 2022-11-20 DIAGNOSIS — E782 Mixed hyperlipidemia: Secondary | ICD-10-CM | POA: Diagnosis not present

## 2022-11-20 DIAGNOSIS — I10 Essential (primary) hypertension: Secondary | ICD-10-CM

## 2022-11-20 DIAGNOSIS — M170 Bilateral primary osteoarthritis of knee: Secondary | ICD-10-CM

## 2022-11-20 NOTE — Assessment & Plan Note (Signed)
BP is well controlled at this time.  Recommend continuation of current medication for management of HTN.  Updating labs today.

## 2022-11-20 NOTE — Assessment & Plan Note (Signed)
Has upcoming TKA.  Her cardiologist has already cleared her and I think that she can proceed with surgery. RCRI 3.9% risk of cardiovascular event, low risk.

## 2022-11-20 NOTE — Patient Instructions (Signed)
Great to see you today! Hope your surgery goes well.

## 2022-11-20 NOTE — Progress Notes (Signed)
Alicia Herrera - 77 y.o. female MRN [id number]  Date of birth: [birth year]  Subjective Chief Complaint  Patient presents with   Annual Exam    Both knee in pain    HPI Alicia Herrera is a 77 y.o. female here today for follow up visit.  She also needs surgical clearance for upcoming knee replacement.  She is planning to have R TKA with Dr. Luiz Blare.    BP has been well controlled with combination of amlodipine and HCTZ.  Denies side effects from medication.  Denies status post, shortness of breath, palpitations, headache or vision changes.    Remains on Repatha, intolerant to statins.  Doing well with this.  History of CABG and valve replacement.   ROS:  A comprehensive ROS was completed and negative except as noted per HPI   No Known Allergies  Past Medical History:  Diagnosis Date   Hx of CABG    Rectocele    Uterine prolapse     Past Surgical History:  Procedure Laterality Date   CARDIAC VALVE REPLACEMENT     CORONARY ARTERY BYPASS GRAFT      Social History   Socioeconomic History   Marital status: Widowed    Spouse name: Not on file   Number of children: 4   Years of education: 17   Highest education level: Bachelor's degree (e.g., BA, AB, BS)  Occupational History   Occupation: Retired   Occupation: Works part time    Comment: 3  mornings at the preschool   Occupation: Words part-time at [employer]  Tobacco Use   Smoking status: Never   Smokeless tobacco: Never  Vaping Use   Vaping Use: Never used  Substance and Sexual Activity   Alcohol use: Yes    Alcohol/week: 1.0 standard drink of alcohol    Types: 1 Glasses of wine per week    Comment: Occasionally   Drug use: Never   Sexual activity: Not Currently    Birth control/protection: Abstinence  Other Topics Concern   Not on file  Social History Narrative   Lives alone but two of her children live close by. She watches her grandkids everyday and sees her family every day. She also has a puppy. She has not been  able to exercise to due her knees.    Social Determinants of Health   Financial Resource Strain: Low Risk  (05/14/2022)   Overall Financial Resource Strain (CARDIA)    Difficulty of Paying Living Expenses: Not very hard  Food Insecurity: No Food Insecurity (05/14/2022)   Hunger Vital Sign    Worried About Running Out of Food in the Last Year: Never true    Ran Out of Food in the Last Year: Never true  Transportation Needs: No Transportation Needs (05/15/2022)   PRAPARE - Administrator, Civil Service (Medical): No    Lack of Transportation (Non-Medical): No  Recent Concern: Transportation Needs - Unmet Transportation Needs (05/14/2022)   PRAPARE - Transportation    Lack of Transportation (Medical): Yes    Lack of Transportation (Non-Medical): Yes  Physical Activity: Insufficiently Active (05/14/2022)   Exercise Vital Sign    Days of Exercise per Week: 4 days    Minutes of Exercise per Session: 10 min  Stress: No Stress Concern Present (05/14/2022)   [employer] of Occupational Health - Occupational Stress Questionnaire    Feeling of Stress : Only a little  Social Connections: Moderately Integrated (05/15/2022)   Social Connection and Isolation Panel [NHANES]  Frequency of Communication with Friends and Family: More than three times a week    Frequency of Social Gatherings with Friends and Family: Three times a week    Attends Religious Services: More than 4 times per year    Active Member of Clubs or Organizations: Yes    Attends Archivist Meetings: More than 4 times per year    Marital Status: Widowed    Family History  Problem Relation Age of Onset   Hypertension Mother    Heart attack Mother    Cancer Father    Penile cancer Father    Diabetes Sister     Health Maintenance  Topic Date Due   COVID-19 Vaccine (3 - 2023-24 season) 12/06/2022 (Originally 07/14/2022)   Pneumonia Vaccine 4+ Years old (1 - PCV) 02/02/2023 (Originally 01/19/2010)   DEXA SCAN   02/02/2023 (Originally 01/19/2010)   INFLUENZA VACCINE  02/11/2023 (Originally 06/13/2022)   Zoster Vaccines- Shingrix (1 of 2) 02/19/2023 (Originally 01/20/1995)   Hepatitis C Screening  05/16/2023 (Originally 01/20/1964)   Medicare Annual Wellness (AWV)  05/16/2023   HPV VACCINES  Aged Out   DTaP/Tdap/Td  Discontinued     ----------------------------------------------------------------------------------------------------------------------------------------------------------------------------------------------------------------- Physical Exam BP 117/76 (BP Location: Left Arm, Patient Position: Sitting, Cuff Size: Large)   Pulse 84   Ht 5\' 6"  (1.676 m)   Wt 180 lb 1.9 oz (81.7 kg)   SpO2 98%   BMI 29.07 kg/m   Physical Exam Constitutional:      Appearance: Normal appearance.  HENT:     Head: Normocephalic and atraumatic.  Eyes:     General: No scleral icterus. Cardiovascular:     Rate and Rhythm: Normal rate and regular rhythm.  Pulmonary:     Effort: Pulmonary effort is normal.     Breath sounds: Normal breath sounds.  Musculoskeletal:     Cervical back: Neck supple.  Neurological:     Mental Status: She is alert.  Psychiatric:        Mood and Affect: Mood normal.        Behavior: Behavior normal.     ------------------------------------------------------------------------------------------------------------------------------------------------------------------------------------------------------------------- Assessment and Plan  Essential hypertension BP is well controlled at this time.  Recommend continuation of current medication for management of HTN.  Updating labs today.   Hyperlipidemia Currently taking repatha, managed by cardiology.  Doing well at this time.   Primary osteoarthritis of both knees Has upcoming TKA.  Her cardiologist has already cleared her and I think that she can proceed with surgery. RCRI 3.9% risk of cardiovascular event, low risk.     No  orders of the defined types were placed in this encounter.   No follow-ups on file.    This visit occurred during the SARS-CoV-2 public health emergency.  Safety protocols were in place, including screening questions prior to the visit, additional usage of staff PPE, and extensive cleaning of exam room while observing appropriate contact time as indicated for disinfecting solutions.

## 2022-11-20 NOTE — Assessment & Plan Note (Signed)
Currently taking repatha, managed by cardiology.  Doing well at this time.

## 2022-11-21 LAB — COMPLETE METABOLIC PANEL WITH GFR
AG Ratio: 1.6 (calc) (ref 1.0–2.5)
ALT: 14 U/L (ref 6–29)
AST: 16 U/L (ref 10–35)
Albumin: 4.4 g/dL (ref 3.6–5.1)
Alkaline phosphatase (APISO): 59 U/L (ref 37–153)
BUN: 18 mg/dL (ref 7–25)
CO2: 29 mmol/L (ref 20–32)
Calcium: 9.4 mg/dL (ref 8.6–10.4)
Chloride: 102 mmol/L (ref 98–110)
Creat: 0.81 mg/dL (ref 0.60–1.00)
Globulin: 2.7 g/dL (calc) (ref 1.9–3.7)
Glucose, Bld: 79 mg/dL (ref 65–99)
Potassium: 4.1 mmol/L (ref 3.5–5.3)
Sodium: 140 mmol/L (ref 135–146)
Total Bilirubin: 0.3 mg/dL (ref 0.2–1.2)
Total Protein: 7.1 g/dL (ref 6.1–8.1)
eGFR: 75 mL/min/{1.73_m2} (ref >=60)

## 2022-11-21 LAB — CBC WITH DIFFERENTIAL/PLATELET
Absolute Monocytes: 392 {cells}/uL (ref 200–950)
Basophils Absolute: 48 {cells}/uL (ref 0–200)
Basophils Relative: 0.9 %
Eosinophils Absolute: 201 {cells}/uL (ref 15–500)
Eosinophils Relative: 3.8 %
HCT: 40.3 % (ref 35.0–45.0)
Hemoglobin: 13.5 g/dL (ref 11.7–15.5)
Lymphs Abs: 1553 {cells}/uL (ref 850–3900)
MCH: 28.2 pg (ref 27.0–33.0)
MCHC: 33.5 g/dL (ref 32.0–36.0)
MCV: 84.1 fL (ref 80.0–100.0)
MPV: 9.6 fL (ref 7.5–12.5)
Monocytes Relative: 7.4 %
Neutro Abs: 3106 {cells}/uL (ref 1500–7800)
Neutrophils Relative %: 58.6 %
Platelets: 273 10*3/uL (ref 140–400)
RBC: 4.79 Million/uL (ref 3.80–5.10)
RDW: 13.6 % (ref 11.0–15.0)
Total Lymphocyte: 29.3 %
WBC: 5.3 10*3/uL (ref 3.8–10.8)

## 2022-11-22 ENCOUNTER — Telehealth: Payer: Self-pay

## 2022-11-22 DIAGNOSIS — M170 Bilateral primary osteoarthritis of knee: Secondary | ICD-10-CM

## 2022-11-22 NOTE — Telephone Encounter (Signed)
Patient states he needs a refill on her hydrocodone and still awaiting surgery can you please refill.

## 2022-11-23 MED ORDER — HYDROCODONE-ACETAMINOPHEN 7.5-325 MG PO TABS: 1.0000 | ORAL_TABLET | Freq: Three times a day (TID) | ORAL | 0 refills | Status: DC | PRN

## 2022-11-23 NOTE — Addendum Note (Signed)
Addended by: Silverio Decamp on: 11/23/2022 12:36 PM   Modules accepted: Orders

## 2022-11-23 NOTE — Telephone Encounter (Signed)
Refilled

## 2022-12-07 ENCOUNTER — Telehealth: Payer: Self-pay

## 2022-12-07 DIAGNOSIS — M170 Bilateral primary osteoarthritis of knee: Secondary | ICD-10-CM

## 2022-12-07 MED ORDER — HYDROCODONE-ACETAMINOPHEN 7.5-325 MG PO TABS: 1.0000 | ORAL_TABLET | Freq: Three times a day (TID) | ORAL | 0 refills | Status: DC | PRN

## 2022-12-07 NOTE — Addendum Note (Signed)
Addended by: Silverio Decamp on: 12/07/2022 03:26 PM   Modules accepted: Orders

## 2022-12-07 NOTE — Telephone Encounter (Signed)
Done

## 2022-12-07 NOTE — Telephone Encounter (Signed)
Patient states needs refill om hydrocodone and states only have enough until tomorrow and still no surgery date please advise.

## 2023-01-08 ENCOUNTER — Other Ambulatory Visit: Payer: Self-pay | Admitting: Orthopedic Surgery

## 2023-01-12 ENCOUNTER — Telehealth: Payer: Self-pay

## 2023-01-12 DIAGNOSIS — M170 Bilateral primary osteoarthritis of knee: Secondary | ICD-10-CM

## 2023-01-12 NOTE — Telephone Encounter (Signed)
Patient needs refill on hydrocodone and states surgery is 02/19/23 please send in refill.

## 2023-01-14 MED ORDER — HYDROCODONE-ACETAMINOPHEN 7.5-325 MG PO TABS: 1.0000 | ORAL_TABLET | Freq: Three times a day (TID) | ORAL | 0 refills | Status: DC | PRN

## 2023-01-14 NOTE — Addendum Note (Signed)
Addended by: Silverio Decamp on: 01/14/2023 09:11 PM   Modules accepted: Orders

## 2023-01-29 DIAGNOSIS — M25661 Stiffness of right knee, not elsewhere classified: Secondary | ICD-10-CM | POA: Diagnosis not present

## 2023-01-29 DIAGNOSIS — M1731 Unilateral post-traumatic osteoarthritis, right knee: Secondary | ICD-10-CM | POA: Diagnosis not present

## 2023-02-07 NOTE — Progress Notes (Addendum)
Anesthesia Review:  PCP: Alicia Herrera LOV 11/20/22  Cardiologist : Alicia Alicia Herrera- LOV 08/01/22- Care Everywhere Spoke with Alicia Herrera and requested cardiac clearance.  She is to fax over.  Clearance 12/02/22 on chart.  LOV 08/01/22 on chart  Chest x-ray : EKG : 07/30/22- Care Everywhere - called [facility] and requested 12 lead EKG tracing  by fax.  Requested by fax EKG tracing in Care Everywhere on 02/13/23.  Echo : 2022 Care Everywhere  Stress test: Cardiac Cath :  Activity level: can do a flgiht of stairs without difficutly  Sleep Study/ CPAP : none  Fasting Blood Sugar :      / Checks Blood Sugar -- times a day:   Blood Thinner/ Instructions /Last Dose: ASA / Instructions/ Last Dose :   325 mg Aspirin- pt states she has not received preop instrucitons regarding 325 mg aspirin.  Instructed pt at preop to contact Alicia Herrera and cardiologist in regards to this.  Pt voiced understanding.     Hx of CABG x 2 and Heart Valve Replacement  [facility] 2014 at [facility] by Alicia Lilia Pro who is now retired.

## 2023-02-07 NOTE — Patient Instructions (Signed)
SURGICAL WAITING ROOM VISITATION  Patients having surgery or a procedure may have no more than 2 support people in the waiting area - these visitors may rotate.    Children under the age of 39 must have an adult with them who is not the patient.  Due to an increase in RSV and influenza rates and associated hospitalizations, children ages 57 and under may not visit patients in [place].  If the patient needs to stay at the hospital during part of their recovery, the visitor guidelines for inpatient rooms apply. Pre-op nurse will coordinate an appropriate time for 1 support person to accompany patient in pre-op.  This support person may not rotate.    Please refer to the  [facility] website for the visitor guidelines for Inpatients (after your surgery is over and you are in a regular room).       Your procedure is scheduled on:  02/19/2023    Report to [facility] Main Entrance    Report to admitting at  [place] AM   Call this number if you have problems the morning of surgery [phone]   Do not eat food :After Midnight.   After Midnight you may have the following liquids until __ 0430____ AMDAY OF SURGERY  Water Non-Citrus Juices (without pulp, NO RED-Apple, White grape, White cranberry) Black Coffee (NO MILK/CREAM OR CREAMERS, sugar ok)  Clear Tea (NO MILK/CREAM OR CREAMERS, sugar ok) regular and decaf                             Plain Jell-O (NO RED)                                           Fruit ices (not with fruit pulp, NO RED)                                     Popsicles (NO RED)                                                               Sports drinks like Gatorade (NO RED)             The day of surgery:  Drink ONE (1) Pre-Surgery Clear Ensure or G2 at  0430 AM  ( have completed by ) the morning of surgery. Drink in one sitting. Do not sip.  This drink was given to you during your hospital  pre-op appointment visit. Nothing else to drink after  completing the  Pre-Surgery Clear Ensure or G2.          If you have questions, please contact your surgeon's office.      Oral Hygiene is also important to reduce your risk of infection.                                    Remember - BRUSH YOUR TEETH THE MORNING OF SURGERY WITH YOUR REGULAR TOOTHPASTE  DENTURES WILL BE REMOVED PRIOR  TO SURGERY PLEASE DO NOT APPLY "Poly grip" OR ADHESIVES!!!   Do NOT smoke after Midnight   Take these medicines the morning of surgery with A SIP OF WATER:   DO NOT TAKE ANY ORAL DIABETIC MEDICATIONS DAY OF YOUR SURGERY  Bring CPAP mask and tubing day of surgery.                              You may not have any metal on your body including hair pins, jewelry, and body piercing             Do not wear make-up, lotions, powders, perfumes/cologne, or deodorant  Do not wear nail polish including gel and S&S, artificial/acrylic nails, or any other type of covering on natural nails including finger and toenails. If you have artificial nails, gel coating, etc. that needs to be removed by a nail salon please have this removed prior to surgery or surgery may need to be canceled/ delayed if the surgeon/ anesthesia feels like they are unable to be safely monitored.   Do not shave  48 hours prior to surgery.               Men may shave face and neck.   Do not bring valuables to the hospital. [place].   Contacts, glasses, dentures or bridgework may not be worn into surgery.   Bring small overnight bag day of surgery.   DO NOT [place]. PHARMACY WILL DISPENSE MEDICATIONS LISTED ON YOUR MEDICATION LIST TO YOU DURING YOUR ADMISSION [place]!    Patients discharged on the day of surgery will not be allowed to drive home.  Someone NEEDS to stay with you for the first 24 hours after anesthesia.   Special Instructions: Bring a copy of your healthcare power of attorney and  living will documents the day of surgery if you haven't scanned them before.              Please read over the following fact sheets you were given: IF [place] [phone]   If you received a COVID test during your pre-op visit  it is requested that you wear a mask when out in public, stay away from anyone that may not be feeling well and notify your surgeon if you develop symptoms. If you test positive for Covid or have been in contact with anyone that has tested positive in the last 10 days please notify you surgeon.    [place] - Preparing for Surgery Before surgery, you can play an important role.  Because skin is not sterile, your skin needs to be as free of germs as possible.  You can reduce the number of germs on your skin by washing with CHG (chlorahexidine gluconate) soap before surgery.  CHG is an antiseptic cleaner which kills germs and bonds with the skin to continue killing germs even after washing. Please DO NOT use if you have an allergy to CHG or antibacterial soaps.  If your skin becomes reddened/irritated stop using the CHG and inform your nurse when you arrive at Short Stay. Do not shave (including legs and underarms) for at least 48 hours prior to the first CHG shower.  You may shave your face/neck. Please follow these instructions carefully:  1.  Shower with CHG Soap startign 5 days prior to surgery and the am of surgery.  Place clean sheets on bed when start the CHG showers. .  2.  If you choose to wash your hair, wash your hair first as usual with your  normal  shampoo.  3.  After you shampoo, rinse your hair and body thoroughly to remove the  shampoo.                           4.  Use CHG as you would any other liquid soap.  You can apply chg directly  to the skin and wash                       Gently with a scrungie or clean washcloth.  5.  Apply the CHG Soap to your body ONLY FROM THE NECK DOWN.   Do not use on face/  open                           Wound or open sores. Avoid contact with eyes, ears mouth and genitals (private parts).                       Wash face,  Genitals (private parts) with your normal soap.             6.  Wash thoroughly, paying special attention to the area where your surgery  will be performed.  7.  Thoroughly rinse your body with warm water from the neck down.  8.  DO NOT shower/wash with your normal soap after using and rinsing off  the CHG Soap.                9.  Pat yourself dry with a clean towel.            10.  Wear clean pajamas.            11.  Place clean sheets on your bed the night of your first shower and do not  sleep with pets. Day of Surgery : Do not apply any lotions/deodorants the morning of surgery.  Please wear clean clothes to the hospital/surgery center.  FAILURE TO FOLLOW THESE INSTRUCTIONS MAY RESULT IN THE CANCELLATION OF YOUR SURGERY PATIENT SIGNATURE_________________________________  NURSE SIGNATURE__________________________________  ________________________________________________________________________

## 2023-02-08 ENCOUNTER — Telehealth: Payer: Self-pay

## 2023-02-08 ENCOUNTER — Other Ambulatory Visit: Payer: Self-pay | Admitting: Family Medicine

## 2023-02-08 DIAGNOSIS — M170 Bilateral primary osteoarthritis of knee: Secondary | ICD-10-CM

## 2023-02-08 NOTE — Telephone Encounter (Signed)
Needs refill on hydrocodone until surgery on the 8th of April please send in.

## 2023-02-09 MED ORDER — HYDROCODONE-ACETAMINOPHEN 7.5-325 MG PO TABS: 1.0000 | ORAL_TABLET | Freq: Three times a day (TID) | ORAL | 0 refills | Status: DC | PRN

## 2023-02-09 NOTE — Addendum Note (Signed)
Addended by: Silverio Decamp on: 02/09/2023 09:44 AM   Modules accepted: Orders

## 2023-02-09 NOTE — Telephone Encounter (Signed)
Done

## 2023-02-12 ENCOUNTER — Ambulatory Visit (HOSPITAL_COMMUNITY)
Admission: RE | Admit: 2023-02-12 | Discharge: 2023-02-12 | Disposition: A | Discharge status: Home / Self Care | Payer: Medicare HMO | Source: Ambulatory Visit | Attending: Orthopedic Surgery | Admitting: Orthopedic Surgery

## 2023-02-12 ENCOUNTER — Encounter (HOSPITAL_COMMUNITY)
Admission: RE | Admit: 2023-02-12 | Discharge: 2023-02-12 | Disposition: A | Discharge status: Home / Self Care | Payer: Medicare HMO | Source: Ambulatory Visit | Attending: Orthopedic Surgery | Admitting: Orthopedic Surgery

## 2023-02-12 ENCOUNTER — Other Ambulatory Visit: Payer: Self-pay

## 2023-02-12 ENCOUNTER — Encounter (HOSPITAL_COMMUNITY): Payer: Self-pay

## 2023-02-12 VITALS — BP 142/73 | HR 63 | Temp 98.2°F | Resp 16 | Ht 67.5 in | Wt 178.0 lb

## 2023-02-12 DIAGNOSIS — I10 Essential (primary) hypertension: Secondary | ICD-10-CM | POA: Insufficient documentation

## 2023-02-12 DIAGNOSIS — I2510 Atherosclerotic heart disease of native coronary artery without angina pectoris: Secondary | ICD-10-CM | POA: Diagnosis not present

## 2023-02-12 DIAGNOSIS — Z01818 Encounter for other preprocedural examination: Secondary | ICD-10-CM | POA: Diagnosis not present

## 2023-02-12 DIAGNOSIS — Z951 Presence of aortocoronary bypass graft: Secondary | ICD-10-CM | POA: Insufficient documentation

## 2023-02-12 DIAGNOSIS — M1711 Unilateral primary osteoarthritis, right knee: Secondary | ICD-10-CM | POA: Insufficient documentation

## 2023-02-12 HISTORY — DX: Atherosclerotic heart disease of native coronary artery without angina pectoris: I25.10

## 2023-02-12 HISTORY — DX: Essential (primary) hypertension: I10

## 2023-02-12 HISTORY — DX: Hypothyroidism, unspecified: E03.9

## 2023-02-12 HISTORY — DX: Unspecified osteoarthritis, unspecified site: M19.90

## 2023-02-12 HISTORY — DX: Cardiac murmur, unspecified: R01.1

## 2023-02-12 LAB — SURGICAL PCR SCREEN
MRSA, PCR: NEGATIVE
Staphylococcus aureus: NEGATIVE

## 2023-02-12 LAB — CBC
HCT: 39.6 % (ref 36.0–46.0)
Hemoglobin: 12.6 g/dL (ref 12.0–15.0)
MCH: 27.4 pg (ref 26.0–34.0)
MCHC: 31.8 g/dL (ref 30.0–36.0)
MCV: 86.1 fL (ref 80.0–100.0)
Platelets: 233 10*3/uL (ref 150–400)
RBC: 4.6 MIL/uL (ref 3.87–5.11)
RDW: 14.6 % (ref 11.5–15.5)
WBC: 4.4 10*3/uL (ref 4.0–10.5)
nRBC: 0 % (ref 0.0–0.2)

## 2023-02-12 LAB — BASIC METABOLIC PANEL
Anion gap: 8 (ref 5–15)
BUN: 15 mg/dL (ref 8–23)
CO2: 25 mmol/L (ref 22–32)
Calcium: 9 mg/dL (ref 8.9–10.3)
Chloride: 103 mmol/L (ref 98–111)
Creatinine, Ser: 0.76 mg/dL (ref 0.44–1.00)
GFR, Estimated: >60 mL/min (ref >60)
Glucose, Bld: 88 mg/dL (ref 70–99)
Potassium: 4 mmol/L (ref 3.5–5.1)
Sodium: 136 mmol/L (ref 135–145)

## 2023-02-13 NOTE — Progress Notes (Signed)
Anesthesia Chart Review   Case: [id number] Date/Time: 02/19/23 0715   Procedure: TOTAL KNEE ARTHROPLASTY (Right: Knee)   Anesthesia type: Spinal   Pre-op diagnosis: RIGHT KNEE DEGENERATIVE JOINT DISEASE   Location: [facility] ROOM 06 / [facility]   Surgeons: Dorna Leitz, MD       DISCUSSION:77 y.o. never smoker with h/o CAD (CABG), s/p AVR, s/p ascending aortic aneurysm repair, HTN, right knee djd scheduled for above procedure 02/19/2023 with Dr. Dorna Leitz.   Pt last seen by cardiology 08/01/2022. Per OV note pt doing well from a cardiac standpoint, blood pressure well controlled. Last echo 2022 with normal ventricular function, aortic valve prosthesis functioning normally, low gradient across prosthesis.   Clearance received from cardiologist which states pt is optimized from a cardiac standpoint, low risk for planned procedure.   Anticipate pt can proceed with planned procedure barring acute status change.   VS: BP (!) 142/73   Pulse 63   Temp 36.8 C (Oral)   Resp 16   Ht 5' 7.5" (1.715 m)   Wt 80.7 kg   SpO2 100%   BMI 27.47 kg/m   PROVIDERS: Luetta Nutting, DO is PCP   Horald Chestnut, MD is Cardiologist  LABS: Labs reviewed: Acceptable for surgery. (all labs ordered are listed, but only abnormal results are displayed)  Labs Reviewed  SURGICAL PCR SCREEN  CBC  BASIC METABOLIC PANEL     IMAGES:   EKG:   CV: Echo 05/27/2021 Left Ventricle: Systolic function is normal. EF: 60-65%. Quantitative  analysis of left ventricle Global Longitudinal Strain (GLS) imaging is </=  -18%, consistent with normal strain; GLS = -22.000% from the apical 4,3,2  chamber views respectively.    Aortic Valve: There is a bioprosthetic valve with an acceptable mean  transvalvular gradient 13 mmHg.    Mitral Valve: There is mild regurgitation.    Tricuspid Valve: There is mild regurgitation.    Tricuspid Valve: The right ventricular systolic pressure is normal (<36  mmHg).  Past Medical  History:  Diagnosis Date   Arthritis    Coronary artery disease    Heart murmur    Hx of CABG    Hypertension    Hypothyroidism    hx of 25 years ago per pt   Rectocele    Uterine prolapse     Past Surgical History:  Procedure Laterality Date   CARDIAC VALVE REPLACEMENT     AVR   CORONARY ARTERY BYPASS GRAFT     prolapsed uterus surgery       MEDICATIONS:  amLODipine (NORVASC) 5 MG tablet   aspirin 325 MG tablet   Evolocumab (REPATHA SURECLICK) [id number] MG/ML SOAJ   hydrochlorothiazide (HYDRODIURIL) 12.5 MG tablet   HYDROcodone-acetaminophen (NORCO) 7.5-325 MG tablet   No current facility-administered medications for this encounter.   Konrad Felix Ward, PA-C [facility] Pre-Surgical Testing [phone]

## 2023-02-13 NOTE — Anesthesia Preprocedure Evaluation (Addendum)
Anesthesia Evaluation  Patient identified by MRN, date of birth, ID band Patient awake    Reviewed: Allergy & Precautions, NPO status , Patient's Chart, lab work & pertinent test results  History of Anesthesia Complications Negative for: history of anesthetic complications  Airway Mallampati: III  TM Distance: >3 FB Neck ROM: Full    Dental  (+) Dental Advisory Given   Pulmonary neg pulmonary ROS, neg shortness of breath, neg sleep apnea, neg COPD, neg recent URI   Pulmonary exam normal breath sounds clear to auscultation       Cardiovascular hypertension (amlodipine, HCTZ), Pt. on medications (-) angina + CAD and + CABG  (-) Past MI and (-) Cardiac Stents + Valvular Problems/Murmurs (AS s/p AVR)  Rhythm:Regular Rate:Normal  HLD, s/p ascending aortic aneurysm repair  TTE 05/27/2021: Impression  Left Ventricle: Systolic function is normal. EF: 60-65%. Quantitative analysis of left ventricle Global Longitudinal Strain (GLS) imaging is </= -18%, consistent with normal strain; GLS = -22.000% from the apical 4,3,2 chamber views respectively.   Aortic Valve: There is a bioprosthetic valve with an acceptable mean transvalvular gradient 13 mmHg.   Mitral Valve: There is mild regurgitation.   Tricuspid Valve: There is mild regurgitation.   Tricuspid Valve: The right ventricular systolic pressure is normal (<36 mmHg).    Neuro/Psych neg Seizures negative neurological ROS     GI/Hepatic negative GI ROS, Neg liver ROS,,,  Endo/Other  neg diabetesHypothyroidism    Renal/GU negative Renal ROS     Musculoskeletal  (+) Arthritis ,    Abdominal   Peds  Hematology negative hematology ROS (+)   Anesthesia Other Findings On aspirin 325 mg - Last dose 02/13/2023  Platelets 233  Reproductive/Obstetrics                             Anesthesia Physical Anesthesia Plan  ASA: 3  Anesthesia Plan:  MAC, Regional and Spinal   Post-op Pain Management: Tylenol PO (pre-op)*   Induction: Intravenous  PONV Risk Score and Plan: 2 and Ondansetron, Dexamethasone and Treatment may vary due to age or medical condition  Airway Management Planned: Natural Airway and Simple Face Mask  Additional Equipment:   Intra-op Plan:   Post-operative Plan:   Informed Consent: I have reviewed the patients History and Physical, chart, labs and discussed the procedure including the risks, benefits and alternatives for the proposed anesthesia with the patient or authorized representative who has indicated his/her understanding and acceptance.     Dental advisory given  Plan Discussed with: CRNA and Anesthesiologist  Anesthesia Plan Comments: (See PAT note 02/12/2023  Discussed potential risks of nerve blocks including, but not limited to, infection, bleeding, nerve damage, seizures, pneumothorax, respiratory depression, and potential failure of the block. Alternatives to nerve blocks discussed. All questions answered.  I have discussed risks of neuraxial anesthesia including but not limited to infection, bleeding, nerve injury, back pain, headache, seizures, and failure of block. Patient denies bleeding disorders and is not currently anticoagulated. Labs have been reviewed. Risks and benefits discussed. All patient's questions answered.   Discussed with patient risks of MAC including, but not limited to, minor pain or discomfort, hearing people in the room, and possible need for backup general anesthesia. Risks for general anesthesia also discussed including, but not limited to, sore throat, hoarse voice, chipped/damaged teeth, injury to vocal cords, nausea and vomiting, allergic reactions, lung infection, heart attack, stroke, and death. All questions answered. )  Anesthesia Quick Evaluation  

## 2023-02-14 NOTE — Care Plan (Signed)
Ortho Bundle Case Management Note  Patient Details  Name: Alicia Herrera MRN: [id number] Date of Birth: 12-17-1945    spoke with patient on phone. she will discharge to home with family to assist. rolling walker ordered for home use. HHPT referral to [place] and [place] set up with [facility]. discharge instructions mailed to patient and questions answered today. Patient and MD in agreement with plan. Choice offered    DME Arranged:  Walker rolling DME Agency:  [employer]  HH Arranged:  PT HH Agency:  [facility]  Additional Comments: Please contact me with any questions of if this plan should need to change.  Ladell Heads,  [facility]  [phone] 02/14/2023, 11:33 AM

## 2023-02-18 NOTE — H&P (Signed)
TOTAL KNEE ADMISSION H&P  Patient is being admitted for right total knee arthroplasty.  Subjective:  Chief Complaint:right knee pain.  HPI: Alicia Herrera, 77 y.o. female, has a history of pain and functional disability in the right knee due to arthritis and has failed non-surgical conservative treatments for greater than 12 weeks to includeNSAID's and/or analgesics, corticosteriod injections, viscosupplementation injections, flexibility and strengthening excercises, weight reduction as appropriate, and activity modification.  Onset of symptoms was gradual, starting 5 years ago with gradually worsening course since that time. The patient noted no past surgery on the right knee(s).  Patient currently rates pain in the right knee(s) at 9 out of 10 with activity. Patient has night pain, worsening of pain with activity and weight bearing, pain that interferes with activities of daily living, pain with passive range of motion, and joint swelling.  Patient has evidence of subchondral cysts, periarticular osteophytes, and joint space narrowing by imaging studies. This patient has had  failure of all reasonable conservative care . There is no active infection.  Patient Active Problem List   Diagnosis Date Noted   Cyst of skin 11/18/2021   Primary osteoarthritis of both knees 05/18/2020   Uterine prolapse 03/26/2019   Rectocele 03/26/2019   Postmenopausal bleeding 03/26/2019   OAB (overactive bladder) 03/26/2019   Midline cystocele 03/26/2019   S/P CABG (coronary artery bypass graft) 11/10/2013   Essential hypertension 11/10/2013   S/P ascending aortic aneurysm repair 10/10/2013   Hyperlipidemia 08/15/2013   Past Medical History:  Diagnosis Date   Arthritis    Coronary artery disease    Heart murmur    Hx of CABG    Hypertension    Hypothyroidism    hx of 25 years ago per pt   Rectocele    Uterine prolapse     Past Surgical History:  Procedure Laterality Date   CARDIAC VALVE REPLACEMENT      AVR   CORONARY ARTERY BYPASS GRAFT     prolapsed uterus surgery       No current facility-administered medications for this encounter.   Current Outpatient Medications  Medication Sig Dispense Refill Last Dose   aspirin 325 MG tablet Take 325 mg by mouth at bedtime.      Evolocumab (REPATHA SURECLICK) 140 MG/ML SOAJ Inject 140 mg into the skin every 14 (fourteen) days.      hydrochlorothiazide (HYDRODIURIL) 12.5 MG tablet Take 1 tablet (12.5 mg total) by mouth daily. 90 tablet 3    amLODipine (NORVASC) 5 MG tablet Take 1 tablet by mouth once daily 90 tablet 0    HYDROcodone-acetaminophen (NORCO) 7.5-325 MG tablet Take 1 tablet by mouth every 8 (eight) hours as needed for moderate pain (cough). 30 tablet 0    No Known Allergies  Social History   Tobacco Use   Smoking status: Never   Smokeless tobacco: Never  Substance Use Topics   Alcohol use: Yes    Alcohol/week: 1.0 standard drink of alcohol    Types: 1 Glasses of wine per week    Comment: Occasionally    Family History  Problem Relation Age of Onset   Hypertension Mother    Heart attack Mother    Cancer Father    Penile cancer Father    Diabetes Sister      Review of Systems ROS: I have reviewed the patient's review of systems thoroughly and there are no positive responses as relates to the HPI.  Objective:  Physical Exam  Vital signs in last 24  hours:   Well-developed well-nourished patient in no acute distress. Alert and oriented x3 HEENT:within normal limits Cardiac: Regular rate and rhythm Pulmonary: Lungs clear to auscultation Abdomen: Soft and nontender.  Normal active bowel sounds  Musculoskeletal: (right knee:  Range of motion.  Limited range of motion.  No instability.  Trace effusion.)  Labs: Recent Results (from the past 2160 hour(s))  Surgical pcr screen     Status: None   Collection Time: 02/12/23  9:48 AM   Specimen: Nasal Mucosa; Nasal Swab  Result Value Ref Range   MRSA, PCR NEGATIVE  NEGATIVE   Staphylococcus aureus NEGATIVE NEGATIVE    Comment: (NOTE) The Xpert SA Assay (FDA approved for NASAL specimens in patients 58 years of age and older), is one component of a comprehensive surveillance program. It is not intended to diagnose infection nor to guide or monitor treatment. Performed at [facility], [address], [place], [place] [address]   CBC per protocol     Status: None   Collection Time: 02/12/23 10:42 AM  Result Value Ref Range   WBC 4.4 4.0 - 10.5 K/uL   RBC 4.60 3.87 - 5.11 MIL/uL   Hemoglobin 12.6 12.0 - 15.0 g/dL   HCT 70.7 86.7 - 54.4 %   MCV 86.1 80.0 - 100.0 fL   MCH 27.4 26.0 - 34.0 pg   MCHC 31.8 30.0 - 36.0 g/dL   RDW 92.0 10.0 - 71.2 %   Platelets 233 150 - 400 K/uL   nRBC 0.0 0.0 - 0.2 %    Comment: Performed at [facility], [address] [place], [place] [address]  Basic metabolic panel     Status: None   Collection Time: 02/12/23 10:42 AM  Result Value Ref Range   Sodium 136 135 - 145 mmol/L   Potassium 4.0 3.5 - 5.1 mmol/L   Chloride 103 98 - 111 mmol/L   CO2 25 22 - 32 mmol/L   Glucose, Bld 88 70 - 99 mg/dL    Comment: Glucose reference range applies only to samples taken after fasting for at least 8 hours.   BUN 15 8 - 23 mg/dL   Creatinine, Ser 8.32 0.44 - 1.00 mg/dL   Calcium 9.0 8.9 - 54.9 mg/dL   GFR, Estimated >82 >64 mL/min    Comment: (NOTE) Calculated using the CKD-EPI Creatinine Equation (2021)    Anion gap 8 5 - 15    Comment: Performed at [facility], [address] [place], [place] [address]     Estimated body mass index is 27.47 kg/m as calculated from the following:   Height as of 02/12/23: 5' 7.5" (1.715 m).   Weight as of 02/12/23: 80.7 kg.   Imaging Review Plain radiographs demonstrate severe degenerative joint disease of the right knee(s). The overall alignment ismild valgus. The bone quality appears to be fair for age and reported activity  level.      Assessment/Plan:  End stage arthritis, right knee   The patient history, physical examination, clinical judgment of the provider and imaging studies are consistent with end stage degenerative joint disease of the right knee(s) and total knee arthroplasty is deemed medically necessary. The treatment options including medical management, injection therapy arthroscopy and arthroplasty were discussed at length. The risks and benefits of total knee arthroplasty were presented and reviewed. The risks due to aseptic loosening, infection, stiffness, patella tracking problems, thromboembolic complications and other imponderables were discussed. The patient acknowledged the explanation,  agreed to proceed with the plan and consent was signed. Patient is being admitted for inpatient treatment for surgery, pain control, PT, OT, prophylactic antibiotics, VTE prophylaxis, progressive ambulation and ADL's and discharge planning. The patient is planning to be discharged home with home health services     Patient's anticipated LOS is less than 2 midnights, meeting these requirements: - Younger than 88 - Lives within 1 hour of care - Has a competent adult at home to recover with post-op recover - NO history of  - Chronic pain requiring opiods  - Diabetes  - Coronary Artery Disease  - Heart failure  - Heart attack  - Stroke  - DVT/VTE  - Cardiac arrhythmia  - Respiratory Failure/COPD  - Renal failure  - Anemia  - Advanced Liver disease

## 2023-02-19 ENCOUNTER — Encounter (HOSPITAL_COMMUNITY): Payer: Self-pay | Admitting: Orthopedic Surgery

## 2023-02-19 ENCOUNTER — Ambulatory Visit (HOSPITAL_BASED_OUTPATIENT_CLINIC_OR_DEPARTMENT_OTHER): Payer: Medicare HMO | Admitting: Anesthesiology

## 2023-02-19 ENCOUNTER — Other Ambulatory Visit: Payer: Self-pay

## 2023-02-19 ENCOUNTER — Encounter (HOSPITAL_COMMUNITY)
Admission: RE | Disposition: A | Discharge status: Home / Self Care | Payer: Self-pay | Source: Home / Self Care | Attending: Orthopedic Surgery

## 2023-02-19 ENCOUNTER — Ambulatory Visit (HOSPITAL_COMMUNITY): Payer: Medicare HMO | Admitting: Physician Assistant

## 2023-02-19 ENCOUNTER — Ambulatory Visit (HOSPITAL_COMMUNITY)
Admission: RE | Admit: 2023-02-19 | Discharge: 2023-02-19 | Disposition: A | Discharge status: Home / Self Care | Payer: Medicare HMO | Attending: Orthopedic Surgery | Admitting: Orthopedic Surgery

## 2023-02-19 DIAGNOSIS — Z01818 Encounter for other preprocedural examination: Secondary | ICD-10-CM

## 2023-02-19 DIAGNOSIS — Z96651 Presence of right artificial knee joint: Secondary | ICD-10-CM

## 2023-02-19 DIAGNOSIS — Z79899 Other long term (current) drug therapy: Secondary | ICD-10-CM | POA: Insufficient documentation

## 2023-02-19 DIAGNOSIS — I10 Essential (primary) hypertension: Secondary | ICD-10-CM

## 2023-02-19 DIAGNOSIS — I2510 Atherosclerotic heart disease of native coronary artery without angina pectoris: Secondary | ICD-10-CM | POA: Insufficient documentation

## 2023-02-19 DIAGNOSIS — E785 Hyperlipidemia, unspecified: Secondary | ICD-10-CM | POA: Insufficient documentation

## 2023-02-19 DIAGNOSIS — Z951 Presence of aortocoronary bypass graft: Secondary | ICD-10-CM | POA: Diagnosis not present

## 2023-02-19 DIAGNOSIS — Z952 Presence of prosthetic heart valve: Secondary | ICD-10-CM | POA: Insufficient documentation

## 2023-02-19 DIAGNOSIS — E039 Hypothyroidism, unspecified: Secondary | ICD-10-CM

## 2023-02-19 DIAGNOSIS — M1711 Unilateral primary osteoarthritis, right knee: Secondary | ICD-10-CM | POA: Diagnosis not present

## 2023-02-19 DIAGNOSIS — M21161 Varus deformity, not elsewhere classified, right knee: Secondary | ICD-10-CM | POA: Diagnosis not present

## 2023-02-19 DIAGNOSIS — Z7982 Long term (current) use of aspirin: Secondary | ICD-10-CM | POA: Insufficient documentation

## 2023-02-19 DIAGNOSIS — G8918 Other acute postprocedural pain: Secondary | ICD-10-CM | POA: Diagnosis not present

## 2023-02-19 DIAGNOSIS — S83141A Lateral subluxation of proximal end of tibia, right knee, initial encounter: Secondary | ICD-10-CM | POA: Diagnosis not present

## 2023-02-19 HISTORY — PX: TOTAL KNEE ARTHROPLASTY: SHX125

## 2023-02-19 SURGERY — ARTHROPLASTY, KNEE, TOTAL
Anesthesia: Monitor Anesthesia Care | Site: Knee | Laterality: Right

## 2023-02-19 MED ORDER — METOCLOPRAMIDE HCL 5 MG PO TABS: 5.0000 mg | ORAL_TABLET | Freq: Three times a day (TID) | ORAL | Status: DC | PRN

## 2023-02-19 MED ORDER — PROPOFOL 500 MG/50ML IV EMUL
INTRAVENOUS | Status: DC | PRN
  Administered 2023-02-19: 100 ug/kg/min via INTRAVENOUS

## 2023-02-19 MED ORDER — SODIUM CHLORIDE (PF) 0.9 % IJ SOLN
INTRAMUSCULAR | Status: AC
  Filled 2023-02-19: qty 50

## 2023-02-19 MED ORDER — BUPIVACAINE HCL (PF) 0.5 % IJ SOLN
INTRAMUSCULAR | Status: AC
  Filled 2023-02-19: qty 30

## 2023-02-19 MED ORDER — PHENYLEPHRINE HCL-NACL 20-0.9 MG/250ML-% IV SOLN
INTRAVENOUS | Status: DC | PRN
  Administered 2023-02-19: 50 ug/min via INTRAVENOUS

## 2023-02-19 MED ORDER — BUPIVACAINE LIPOSOME 1.3 % IJ SUSP
INTRAMUSCULAR | Status: AC
  Filled 2023-02-19: qty 20

## 2023-02-19 MED ORDER — LACTATED RINGERS IV SOLN: INTRAVENOUS | Status: DC

## 2023-02-19 MED ORDER — ONDANSETRON HCL 4 MG PO TABS: 4.0000 mg | ORAL_TABLET | Freq: Four times a day (QID) | ORAL | Status: DC | PRN

## 2023-02-19 MED ORDER — DOCUSATE SODIUM 100 MG PO CAPS: 100.0000 mg | ORAL_CAPSULE | Freq: Two times a day (BID) | ORAL | 0 refills | Status: DC | PRN

## 2023-02-19 MED ORDER — METHOCARBAMOL 500 MG IVPB - SIMPLE MED: 500.0000 mg | Freq: Four times a day (QID) | INTRAVENOUS | Status: DC | PRN

## 2023-02-19 MED ORDER — ORAL CARE MOUTH RINSE: 15.0000 mL | Freq: Once | OROMUCOSAL | Status: AC

## 2023-02-19 MED ORDER — TRANEXAMIC ACID-NACL 1000-0.7 MG/100ML-% IV SOLN
1000.0000 mg | INTRAVENOUS | Status: AC
  Administered 2023-02-19: 1000 mg via INTRAVENOUS
  Filled 2023-02-19: qty 100

## 2023-02-19 MED ORDER — OXYCODONE HCL 5 MG/5ML PO SOLN: 5.0000 mg | Freq: Once | ORAL | Status: DC | PRN

## 2023-02-19 MED ORDER — SODIUM CHLORIDE 0.9 % IR SOLN
Status: DC | PRN
  Administered 2023-02-19: 1000 mL

## 2023-02-19 MED ORDER — AMISULPRIDE (ANTIEMETIC) 5 MG/2ML IV SOLN: 10.0000 mg | Freq: Once | INTRAVENOUS | Status: DC | PRN

## 2023-02-19 MED ORDER — FENTANYL CITRATE (PF) 100 MCG/2ML IJ SOLN
INTRAMUSCULAR | Status: DC | PRN
  Administered 2023-02-19: 100 ug via INTRAVENOUS

## 2023-02-19 MED ORDER — ACETAMINOPHEN 500 MG PO TABS
1000.0000 mg | ORAL_TABLET | Freq: Once | ORAL | Status: AC
  Administered 2023-02-19: 1000 mg via ORAL
  Filled 2023-02-19: qty 2

## 2023-02-19 MED ORDER — HYDROMORPHONE HCL 1 MG/ML IJ SOLN: 0.5000 mg | INTRAMUSCULAR | Status: DC | PRN

## 2023-02-19 MED ORDER — OXYCODONE HCL 5 MG PO TABS
5.0000 mg | ORAL_TABLET | ORAL | Status: DC | PRN
  Administered 2023-02-19: 5 mg via ORAL

## 2023-02-19 MED ORDER — ASPIRIN 325 MG PO TBEC: 325.0000 mg | DELAYED_RELEASE_TABLET | Freq: Two times a day (BID) | ORAL | 0 refills | Status: DC

## 2023-02-19 MED ORDER — FENTANYL CITRATE (PF) 100 MCG/2ML IJ SOLN
INTRAMUSCULAR | Status: AC
  Filled 2023-02-19: qty 2

## 2023-02-19 MED ORDER — CHLORHEXIDINE GLUCONATE 0.12 % MT SOLN
15.0000 mL | Freq: Once | OROMUCOSAL | Status: AC
  Administered 2023-02-19: 15 mL via OROMUCOSAL

## 2023-02-19 MED ORDER — FENTANYL CITRATE PF 50 MCG/ML IJ SOSY: 25.0000 ug | PREFILLED_SYRINGE | INTRAMUSCULAR | Status: DC | PRN

## 2023-02-19 MED ORDER — PHENYLEPHRINE 80 MCG/ML (10ML) SYRINGE FOR IV PUSH (FOR BLOOD PRESSURE SUPPORT)
PREFILLED_SYRINGE | INTRAVENOUS | Status: DC | PRN
  Administered 2023-02-19: 160 ug via INTRAVENOUS

## 2023-02-19 MED ORDER — ONDANSETRON HCL 4 MG/2ML IJ SOLN: 4.0000 mg | Freq: Four times a day (QID) | INTRAMUSCULAR | Status: DC | PRN

## 2023-02-19 MED ORDER — BUPIVACAINE LIPOSOME 1.3 % IJ SUSP: 20.0000 mL | Freq: Once | INTRAMUSCULAR | Status: DC

## 2023-02-19 MED ORDER — BUPIVACAINE IN DEXTROSE 0.75-8.25 % IT SOLN
INTRATHECAL | Status: DC | PRN
  Administered 2023-02-19: 1.6 mL via INTRATHECAL

## 2023-02-19 MED ORDER — DEXAMETHASONE SODIUM PHOSPHATE 10 MG/ML IJ SOLN
INTRAMUSCULAR | Status: DC | PRN
  Administered 2023-02-19: 4 mg via INTRAVENOUS

## 2023-02-19 MED ORDER — DEXAMETHASONE SODIUM PHOSPHATE 10 MG/ML IJ SOLN
INTRAMUSCULAR | Status: AC
  Filled 2023-02-19: qty 1

## 2023-02-19 MED ORDER — METHOCARBAMOL 500 MG PO TABS
500.0000 mg | ORAL_TABLET | Freq: Four times a day (QID) | ORAL | Status: DC | PRN
  Administered 2023-02-19: 500 mg via ORAL

## 2023-02-19 MED ORDER — BUPIVACAINE-EPINEPHRINE 0.5% -1:200000 IJ SOLN
INTRAMUSCULAR | Status: DC | PRN
  Administered 2023-02-19: 30 mL

## 2023-02-19 MED ORDER — ACETAMINOPHEN 325 MG PO TABS: 325.0000 mg | ORAL_TABLET | Freq: Four times a day (QID) | ORAL | Status: DC | PRN

## 2023-02-19 MED ORDER — OXYCODONE HCL 5 MG PO TABS: 10.0000 mg | ORAL_TABLET | ORAL | Status: DC | PRN

## 2023-02-19 MED ORDER — LACTATED RINGERS IV BOLUS
250.0000 mL | Freq: Once | INTRAVENOUS | Status: AC
  Administered 2023-02-19: 250 mL via INTRAVENOUS

## 2023-02-19 MED ORDER — ONDANSETRON HCL 4 MG/2ML IJ SOLN
INTRAMUSCULAR | Status: DC | PRN
  Administered 2023-02-19: 4 mg via INTRAVENOUS

## 2023-02-19 MED ORDER — TRANEXAMIC ACID-NACL 1000-0.7 MG/100ML-% IV SOLN: 1000.0000 mg | Freq: Once | INTRAVENOUS | Status: DC

## 2023-02-19 MED ORDER — SODIUM CHLORIDE 0.9% FLUSH
INTRAVENOUS | Status: DC | PRN
  Administered 2023-02-19: 50 mL

## 2023-02-19 MED ORDER — BUPIVACAINE LIPOSOME 1.3 % IJ SUSP
INTRAMUSCULAR | Status: DC | PRN
  Administered 2023-02-19: 20 mL

## 2023-02-19 MED ORDER — LACTATED RINGERS IV BOLUS
500.0000 mL | Freq: Once | INTRAVENOUS | Status: AC
  Administered 2023-02-19: 500 mL via INTRAVENOUS

## 2023-02-19 MED ORDER — PHENYLEPHRINE HCL-NACL 20-0.9 MG/250ML-% IV SOLN
INTRAVENOUS | Status: AC
  Filled 2023-02-19: qty 250

## 2023-02-19 MED ORDER — OXYCODONE HCL 5 MG PO TABS: 5.0000 mg | ORAL_TABLET | Freq: Once | ORAL | Status: DC | PRN

## 2023-02-19 MED ORDER — EPINEPHRINE PF 1 MG/ML IJ SOLN
INTRAMUSCULAR | Status: AC
  Filled 2023-02-19: qty 1

## 2023-02-19 MED ORDER — CEFAZOLIN SODIUM-DEXTROSE 2-4 GM/100ML-% IV SOLN
2.0000 g | INTRAVENOUS | Status: AC
  Administered 2023-02-19: 2 g via INTRAVENOUS
  Filled 2023-02-19: qty 100

## 2023-02-19 MED ORDER — PHENYLEPHRINE 80 MCG/ML (10ML) SYRINGE FOR IV PUSH (FOR BLOOD PRESSURE SUPPORT)
PREFILLED_SYRINGE | INTRAVENOUS | Status: AC
  Filled 2023-02-19: qty 10

## 2023-02-19 MED ORDER — ACETAMINOPHEN 500 MG PO TABS: 1000.0000 mg | ORAL_TABLET | Freq: Four times a day (QID) | ORAL | Status: DC

## 2023-02-19 MED ORDER — METOCLOPRAMIDE HCL 5 MG/ML IJ SOLN: 5.0000 mg | Freq: Three times a day (TID) | INTRAMUSCULAR | Status: DC | PRN

## 2023-02-19 MED ORDER — OXYCODONE HCL 5 MG PO TABS: 5.0000 mg | ORAL_TABLET | ORAL | 0 refills | Status: DC | PRN

## 2023-02-19 MED ORDER — CELECOXIB 200 MG PO CAPS: 200.0000 mg | ORAL_CAPSULE | Freq: Two times a day (BID) | ORAL | 0 refills | Status: DC

## 2023-02-19 MED ORDER — OXYCODONE HCL 5 MG PO TABS
ORAL_TABLET | ORAL | Status: AC
  Filled 2023-02-19: qty 1

## 2023-02-19 MED ORDER — METHOCARBAMOL 500 MG PO TABS
ORAL_TABLET | ORAL | Status: AC
  Filled 2023-02-19: qty 1

## 2023-02-19 MED ORDER — ROPIVACAINE HCL 5 MG/ML IJ SOLN
INTRAMUSCULAR | Status: DC | PRN
  Administered 2023-02-19: 20 mL via PERINEURAL

## 2023-02-19 MED ORDER — 0.9 % SODIUM CHLORIDE (POUR BTL) OPTIME
TOPICAL | Status: DC | PRN
  Administered 2023-02-19: 1000 mL

## 2023-02-19 MED ORDER — CEFAZOLIN SODIUM-DEXTROSE 2-4 GM/100ML-% IV SOLN: 2.0000 g | Freq: Four times a day (QID) | INTRAVENOUS | Status: DC

## 2023-02-19 MED ORDER — PROPOFOL 1000 MG/100ML IV EMUL
INTRAVENOUS | Status: AC
  Filled 2023-02-19: qty 100

## 2023-02-19 MED ORDER — ONDANSETRON HCL 4 MG/2ML IJ SOLN
INTRAMUSCULAR | Status: AC
  Filled 2023-02-19: qty 2

## 2023-02-19 MED ORDER — POVIDONE-IODINE 10 % EX SWAB: 2.0000 | Freq: Once | CUTANEOUS | Status: DC

## 2023-02-19 SURGICAL SUPPLY — 63 items
APL SKNCLS STERI-STRIP NONHPOA (GAUZE/BANDAGES/DRESSINGS) ×1
ATTUNE PS FEM RT SZ 5 CEM KNEE (Femur) IMPLANT
ATTUNE PSRP INSR SZ5 8 KNEE (Insert) IMPLANT
BAG COUNTER SPONGE SURGICOUNT (BAG) IMPLANT
BAG SPEC THK2 15X12 ZIP CLS (MISCELLANEOUS) ×1
BAG SPNG CNTER NS LX DISP (BAG) ×1
BAG ZIPLOCK 12X15 (MISCELLANEOUS) ×1 IMPLANT
BASE TIBIAL ROT PLAT SZ 5 KNEE (Knees) IMPLANT
BENZOIN TINCTURE PRP APPL 2/3 (GAUZE/BANDAGES/DRESSINGS) ×1 IMPLANT
BLADE SAGITTAL 25.0X1.19X90 (BLADE) ×1 IMPLANT
BLADE SAW SGTL 13.0X1.19X90.0M (BLADE) ×1 IMPLANT
BLADE SURG SZ10 CARB STEEL (BLADE) ×2 IMPLANT
BNDG CMPR 5X62 HK CLSR LF (GAUZE/BANDAGES/DRESSINGS) ×1
BNDG CMPR MED 10X6 ELC LF (GAUZE/BANDAGES/DRESSINGS) ×1
BNDG ELASTIC 6INX 5YD STR LF (GAUZE/BANDAGES/DRESSINGS) ×1 IMPLANT
BNDG ELASTIC 6X10 VLCR STRL LF (GAUZE/BANDAGES/DRESSINGS) IMPLANT
BOOTIES KNEE HIGH SLOAN (MISCELLANEOUS) ×1 IMPLANT
BOWL SMART MIX CTS (DISPOSABLE) ×1 IMPLANT
BSPLAT TIB 5 CMNT ROT PLAT STR (Knees) ×1 IMPLANT
CEMENT HV SMART SET (Cement) ×2 IMPLANT
COVER SURGICAL LIGHT HANDLE (MISCELLANEOUS) ×1 IMPLANT
CUFF TOURN SGL QUICK 34 (TOURNIQUET CUFF) ×1
CUFF TRNQT CYL 34X4.125X (TOURNIQUET CUFF) ×1 IMPLANT
DRAPE INCISE IOBAN 66X45 STRL (DRAPES) ×1 IMPLANT
DRAPE U-SHAPE 47X51 STRL (DRAPES) ×1 IMPLANT
DRSG AQUACEL AG ADV 3.5X10 (GAUZE/BANDAGES/DRESSINGS) ×1 IMPLANT
DURAPREP 26ML APPLICATOR (WOUND CARE) ×1 IMPLANT
ELECT REM PT RETURN 15FT ADLT (MISCELLANEOUS) ×1 IMPLANT
FILTER STRAW (MISCELLANEOUS) IMPLANT
GLOVE BIOGEL PI IND STRL 8 (GLOVE) ×2 IMPLANT
GLOVE ECLIPSE 7.5 STRL STRAW (GLOVE) ×2 IMPLANT
GOWN STRL REUS W/ TWL XL LVL3 (GOWN DISPOSABLE) ×2 IMPLANT
GOWN STRL REUS W/TWL XL LVL3 (GOWN DISPOSABLE) ×2
HANDPIECE INTERPULSE COAX TIP (DISPOSABLE) ×1
HOLDER FOLEY CATH W/STRAP (MISCELLANEOUS) IMPLANT
HOOD PEEL AWAY T7 (MISCELLANEOUS) ×3 IMPLANT
KIT TURNOVER KIT A (KITS) IMPLANT
MANIFOLD NEPTUNE II (INSTRUMENTS) ×1 IMPLANT
MARKER SKIN DUAL TIP RULER LAB (MISCELLANEOUS) IMPLANT
NDL HYPO 22X1.5 SAFETY MO (MISCELLANEOUS) ×2 IMPLANT
NEEDLE HYPO 22X1.5 SAFETY MO (MISCELLANEOUS) ×2 IMPLANT
NS IRRIG 1000ML POUR BTL (IV SOLUTION) ×1 IMPLANT
PACK TOTAL KNEE CUSTOM (KITS) ×1 IMPLANT
PADDING CAST COTTON 6X4 STRL (CAST SUPPLIES) ×1 IMPLANT
PATELLA MEDIAL ATTUN 35MM KNEE (Knees) IMPLANT
PIN STEINMAN FIXATION KNEE (PIN) IMPLANT
PROTECTOR NERVE ULNAR (MISCELLANEOUS) ×1 IMPLANT
SET HNDPC FAN SPRY TIP SCT (DISPOSABLE) ×1 IMPLANT
SPIKE FLUID TRANSFER (MISCELLANEOUS) ×2 IMPLANT
STRIP CLOSURE SKIN 1/2X4 (GAUZE/BANDAGES/DRESSINGS) IMPLANT
SUT MNCRL AB 3-0 PS2 18 (SUTURE) ×1 IMPLANT
SUT VIC AB 0 CT1 36 (SUTURE) ×1 IMPLANT
SUT VIC AB 1 CT1 36 (SUTURE) ×2 IMPLANT
SUT VIC AB 2-0 CT1 27 (SUTURE) ×1
SUT VIC AB 2-0 CT1 TAPERPNT 27 (SUTURE) ×1 IMPLANT
SYR 27GX1/2 1ML LL SAFETY (SYRINGE) IMPLANT
SYR CONTROL 10ML LL (SYRINGE) ×2 IMPLANT
TIBIAL BASE ROT PLAT SZ 5 KNEE (Knees) ×1 IMPLANT
TRAY FOLEY MTR SLVR 14FR STAT (SET/KITS/TRAYS/PACK) IMPLANT
TRAY FOLEY MTR SLVR 16FR STAT (SET/KITS/TRAYS/PACK) IMPLANT
TUBE SUCTION HIGH CAP CLEAR NV (SUCTIONS) ×1 IMPLANT
WATER STERILE IRR 1000ML POUR (IV SOLUTION) ×2 IMPLANT
WRAP KNEE MAXI GEL POST OP (GAUZE/BANDAGES/DRESSINGS) ×1 IMPLANT

## 2023-02-19 NOTE — Discharge Summary (Signed)
Patient ID: Alicia Herrera MRN: [id number] DOB/AGE: 78/07/1946 78 y.o.  Admit date: 02/19/2023 Discharge date: 02/19/2023  Admission Diagnoses:  Principal Problem:   Status post total knee replacement, right Active Problems:   Primary osteoarthritis of right knee   Discharge Diagnoses:  Same  Past Medical History:  Diagnosis Date   Arthritis    Coronary artery disease    Heart murmur    Hx of CABG    Hypertension    Hypothyroidism    hx of 25 years ago per pt   Rectocele    Uterine prolapse     Surgeries: Procedure(s): RIGHT TOTAL KNEE ARTHROPLASTY on 02/19/2023   Consultants:   Discharged Condition: Improved  Hospital Course: Alicia Herrera is an 77 y.o. female who was admitted 02/19/2023 for operative treatment of primary osteoarthritis of the right knee.  Patient has severe unremitting pain that affects sleep, daily activities, and work/hobbies. After pre-op clearance the patient was taken to the operating room on 02/19/2023 and underwent  Procedure(s): RIGHT TOTAL KNEE ARTHROPLASTY.    Patient was given perioperative antibiotics:  Anti-infectives (From admission, onward)    Start     Dose/Rate Route Frequency Ordered Stop   02/19/23 1400  ceFAZolin (ANCEF) IVPB 2g/100 mL premix        2 g 200 mL/hr over 30 Minutes Intravenous Every 6 hours 02/19/23 0944 02/20/23 0159   02/19/23 0600  ceFAZolin (ANCEF) IVPB 2g/100 mL premix        2 g 200 mL/hr over 30 Minutes Intravenous On call to O.R. 02/19/23 0534 02/19/23 0739        Patient was given sequential compression devices, early ambulation, and chemoprophylaxis to prevent DVT.  Patient benefited maximally from hospital stay and there were no complications.    Recent vital signs: Patient Vitals for the past 24 hrs:  BP Temp Temp src Pulse Resp SpO2 Height Weight  02/19/23 1100 122/69 -- -- 78 -- 96 % -- --  02/19/23 1015 113/67 97.8 F (36.6 C) -- (!) 59 12 96 % -- --  02/19/23 1000 (!) 113/57 -- -- 66 10 92 % -- --   02/19/23 0945 109/61 -- -- 67 10 95 % -- --  02/19/23 0937 (!) 96/55 97.8 F (36.6 C) -- 68 10 93 % -- --  02/19/23 0552 (!) 150/82 97.7 F (36.5 C) Oral 71 16 97 % -- --  02/19/23 0536 -- -- -- -- -- -- 5' 7.5" (1.715 m) 80.7 kg     Recent laboratory studies: No results for input(s): "WBC", "HGB", "HCT", "PLT", "NA", "K", "CL", "CO2", "BUN", "CREATININE", "GLUCOSE", "INR", "CALCIUM" in the last 72 hours.  Invalid input(s): "PT", "2"   Discharge Medications:   Allergies as of 02/19/2023   No Known Allergies      Medication List     STOP taking these medications    aspirin 325 MG tablet Replaced by: aspirin EC 325 MG tablet   HYDROcodone-acetaminophen 7.5-325 MG tablet Commonly known as: NORCO       TAKE these medications    amLODipine 5 MG tablet Commonly known as: NORVASC Take 1 tablet by mouth once daily   aspirin EC 325 MG tablet Take 1 tablet (325 mg total) by mouth 2 (two) times daily after a meal. Replaces: aspirin 325 MG tablet   celecoxib 200 MG capsule Commonly known as: CeleBREX Take 1 capsule (200 mg total) by mouth 2 (two) times daily.   docusate sodium 100 MG capsule Commonly known as:  Colace Take 1 capsule (100 mg total) by mouth 2 (two) times daily as needed (for constipation).   hydrochlorothiazide 12.5 MG tablet Commonly known as: HYDRODIURIL Take 1 tablet (12.5 mg total) by mouth daily.   oxyCODONE 5 MG immediate release tablet Commonly known as: Roxicodone Take 1-2 tablets (5-10 mg total) by mouth every 4 (four) hours as needed for severe pain.   Repatha SureClick 140 MG/ML Soaj Generic drug: Evolocumab Inject 140 mg into the skin every 14 (fourteen) days.        Diagnostic Studies: DG Chest 2 View  Result Date: 02/13/2023 CLINICAL DATA:  Preop chest for right knee replacement. EXAM: CHEST - 2 VIEW COMPARISON:  None Available. FINDINGS: Cardiac silhouette is borderline enlarged. There are changes from previous cardiac surgery and  aortic valve replacement. No mediastinal or hilar masses. No enlarged lymph nodes. Lungs essentially clear.  No pleural effusion or pneumothorax. Skeletal structures are intact. IMPRESSION: No acute cardiopulmonary disease. Electronically Signed   By: Amie Portland M.D.   On: 02/13/2023 13:23    Disposition: Discharge disposition: 01-Home or Self Care       Discharge Instructions     Call MD for:  difficulty breathing, headache or visual disturbances   Complete by: As directed    Call MD for:  persistant nausea and vomiting   Complete by: As directed    Call MD for:  severe uncontrolled pain   Complete by: As directed    Call MD for:  temperature >100.4   Complete by: As directed    Diet - low sodium heart healthy   Complete by: As directed    Discharge instructions   Complete by: As directed    INSTRUCTIONS AFTER JOINT REPLACEMENT   Remove items at home which could result in a fall. This includes throw rugs or furniture in walking pathways ICE to the affected joint every three hours while awake for 30 minutes at a time, for at least the first 3-5 days, and then as needed for pain and swelling.  Continue to use ice for pain and swelling. You may notice swelling that will progress down to the foot and ankle.  This is normal after surgery.  Elevate your leg when you are not up walking on it.   Continue to use the breathing machine you got in the hospital (incentive spirometer) which will help keep your temperature down.  It is common for your temperature to cycle up and down following surgery, especially at night when you are not up moving around and exerting yourself.  The breathing machine keeps your lungs expanded and your temperature down.   DIET:  As you were doing prior to hospitalization, we recommend a well-balanced diet.  DRESSING / WOUND CARE / SHOWERING  Keep the surgical dressing until follow up.  The dressing is water proof, so you can shower without any extra covering.   IF THE DRESSING FALLS OFF or the wound gets wet inside, change the dressing with sterile gauze.  Please use good hand washing techniques before changing the dressing.  Do not use any lotions or creams on the incision until instructed by your surgeon.    ACTIVITY  Increase activity slowly as tolerated, but follow the weight bearing instructions below.   No driving for 6 weeks or until further direction given by your physician.  You cannot drive while taking narcotics.  No lifting or carrying greater than 10 lbs. until further directed by your surgeon. Avoid periods of inactivity  such as sitting longer than an hour when not asleep. This helps prevent blood clots.  You may return to work once you are authorized by your doctor.     WEIGHT BEARING   Weight bearing as tolerated with assist device (walker, cane, etc) as directed, use it as long as suggested by your surgeon or therapist, typically at least 4-6 weeks.   EXERCISES  Results after joint replacement surgery are often greatly improved when you follow the exercise, range of motion and muscle strengthening exercises prescribed by your doctor. Safety measures are also important to protect the joint from further injury. Any time any of these exercises cause you to have increased pain or swelling, decrease what you are doing until you are comfortable again and then slowly increase them. If you have problems or questions, call your caregiver or physical therapist for advice.   Rehabilitation is important following a joint replacement. After just a few days of immobilization, the muscles of the leg can become weakened and shrink (atrophy).  These exercises are designed to build up the tone and strength of the thigh and leg muscles and to improve motion. Often times heat used for twenty to thirty minutes before working out will loosen up your tissues and help with improving the range of motion but do not use heat for the first two weeks following  surgery (sometimes heat can increase post-operative swelling).   These exercises can be done on a training (exercise) mat, on the floor, on a table or on a bed. Use whatever works the best and is most comfortable for you.    Use music or television while you are exercising so that the exercises are a pleasant break in your day. This will make your life better with the exercises acting as a break in your routine that you can look forward to.   Perform all exercises about fifteen times, three times per day or as directed.  You should exercise both the operative leg and the other leg as well.  Exercises include:   Quad Sets - Tighten up the muscle on the front of the thigh (Quad) and hold for 5-10 seconds.   Straight Leg Raises - With your knee straight (if you were given a brace, keep it on), lift the leg to 60 degrees, hold for 3 seconds, and slowly lower the leg.  Perform this exercise against resistance later as your leg gets stronger.  Leg Slides: Lying on your back, slowly slide your foot toward your buttocks, bending your knee up off the floor (only go as far as is comfortable). Then slowly slide your foot back down until your leg is flat on the floor again.  Angel Wings: Lying on your back spread your legs to the side as far apart as you can without causing discomfort.  Hamstring Strength:  Lying on your back, push your heel against the floor with your leg straight by tightening up the muscles of your buttocks.  Repeat, but this time bend your knee to a comfortable angle, and push your heel against the floor.  You may put a pillow under the heel to make it more comfortable if necessary.   A rehabilitation program following joint replacement surgery can speed recovery and prevent re-injury in the future due to weakened muscles. Contact your doctor or a physical therapist for more information on knee rehabilitation.    CONSTIPATION  Constipation is defined medically as fewer than three stools per  week and severe constipation as  less than one stool per week.  Even if you have a regular bowel pattern at home, your normal regimen is likely to be disrupted due to multiple reasons following surgery.  Combination of anesthesia, postoperative narcotics, change in appetite and fluid intake all can affect your bowels.   YOU MUST use at least one of the following options; they are listed in order of increasing strength to get the job done.  They are all available over the counter, and you may need to use some, POSSIBLY even all of these options:    Drink plenty of fluids (prune juice may be helpful) and high fiber foods Colace 100 mg by mouth twice a day  Senokot for constipation as directed and as needed Dulcolax (bisacodyl), take with full glass of water  Miralax (polyethylene glycol) once or twice a day as needed.  If you have tried all these things and are unable to have a bowel movement in the first 3-4 days after surgery call either your surgeon or your primary doctor.    If you experience loose stools or diarrhea, hold the medications until you stool forms back up.  If your symptoms do not get better within 1 week or if they get worse, check with your doctor.  If you experience "the worst abdominal pain ever" or develop nausea or vomiting, please contact the office immediately for further recommendations for treatment.   ITCHING:  If you experience itching with your medications, try taking only a single pain pill, or even half a pain pill at a time.  You can also use Benadryl over the counter for itching or also to help with sleep.   TED HOSE STOCKINGS:  Use stockings on both legs until for at least 2 weeks or as directed by physician office. They may be removed at night for sleeping.  MEDICATIONS:  See your medication summary on the "After Visit Summary" that nursing will review with you.  You may have some home medications which will be placed on hold until you complete the course of blood  thinner medication.  It is important for you to complete the blood thinner medication as prescribed.  PRECAUTIONS:  If you experience chest pain or shortness of breath - call 911 immediately for transfer to the hospital emergency department.   If you develop a fever greater that 101 F, purulent drainage from wound, increased redness or drainage from wound, foul odor from the wound/dressing, or calf pain - CONTACT YOUR SURGEON.                                                   FOLLOW-UP APPOINTMENTS:  If you do not already have a post-op appointment, please call the office for an appointment to be seen by your surgeon.  Guidelines for how soon to be seen are listed in your "After Visit Summary", but are typically between 1-4 weeks after surgery.  OTHER INSTRUCTIONS:   Knee Replacement:  Do not place pillow under knee, focus on keeping the knee straight while resting. CPM instructions: 0-90 degrees, 2 hours in the morning, 2 hours in the afternoon, and 2 hours in the evening. Place foam block, curve side up under heel at all times except when in CPM or when walking.  DO NOT modify, tear, cut, or change the foam block in any way.  POST-OPERATIVE  OPIOID TAPER INSTRUCTIONS: It is important to wean off of your opioid medication as soon as possible. If you do not need pain medication after your surgery it is ok to stop day one. Opioids include: Codeine, Hydrocodone(Norco, Vicodin), Oxycodone(Percocet, oxycontin) and hydromorphone amongst others.  Long term and even short term use of opiods can cause: Increased pain response Dependence Constipation Depression Respiratory depression And more.  Withdrawal symptoms can include Flu like symptoms Nausea, vomiting And more Techniques to manage these symptoms Hydrate well Eat regular healthy meals Stay active Use relaxation techniques(deep breathing, meditating, yoga) Do Not substitute Alcohol to help with tapering If you have been on opioids for  less than two weeks and do not have pain than it is ok to stop all together.  Plan to wean off of opioids This plan should start within one week post op of your joint replacement. Maintain the same interval or time between taking each dose and first decrease the dose.  Cut the total daily intake of opioids by one tablet each day Next start to increase the time between doses. The last dose that should be eliminated is the evening dose.     MAKE SURE YOU:  Understand these instructions.  Get help right away if you are not doing well or get worse.    Thank you for letting us be a part of your medical care team.  It is a privilege we respect greatly.  We hope these instructions will help you stay on track for a fast and full recovery!   Driving Restrictions   Complete by: As directed    No driving until cleared by your surgical team.   Increase activity slowly   Complete by: As directed         Follow-up Information     Jodi Geralds, MD. Go on 03/06/2023.   Specialty: Orthopedic Surgery Why: Your appointment is scheduled for 9:15. Contact information: [address] [place] [address] [phone]         [facility] Follow up.   Specialty: Home Health Services Why: HHPT will provide 6 home visits prior to starting to outpatient physical therapy Contact information: [address] [place] [address] [phone]         [facility]. Go on 03/05/2023.   Why: Your outpatient physical therapy appoitment is at 11:00 Contact information: [phone]                 Signed: Shanon Payor [facility] 02/19/2023, 12:31 PM

## 2023-02-19 NOTE — Op Note (Signed)
PATIENT ID:      Alicia Herrera  MRN:     [id number] DOB/AGE:    [birth year] / 77 y.o.       OPERATIVE REPORT   DATE OF PROCEDURE:  02/19/2023      PREOPERATIVE DIAGNOSIS:   RIGHT KNEE DEGENERATIVE JOINT DISEASE      Estimated body mass index is 27.47 kg/m as calculated from the following:   Height as of this encounter: 5' 7.5" (1.715 m).   Weight as of this encounter: 80.7 kg.                                                       POSTOPERATIVE DIAGNOSIS:   Same                                                                  PROCEDURE:  Procedure(s): TOTAL KNEE ARTHROPLASTY Using DepuyAttune RP implants #5 Femur, #5Tibia, 8 mm Attune RP bearing, 35 Patella    SURGEON: [name]  ASSISTANT:   Mike [email or web address]   (Present and scrubbed throughout the case, critical for assistance with exposure, retraction, instrumentation, and closure.)        ANESTHESIA: spinal, 20cc Exparel, 50cc 0.25% Marcaine EBL: min cc FLUID REPLACEMENT: unk cc crystaloid TOURNIQUET: DRAINS: None TRANEXAMIC ACID: 1gm IV, 2gm topical COMPLICATIONS:  None         INDICATIONS FOR PROCEDURE: The patient has  RIGHT KNEE DEGENERATIVE JOINT DISEASE, mild varus deformities, XR shows bone on bone arthritis, lateral subluxation of tibia. Patient has failed all conservative measures including anti-inflammatory medicines, narcotics, attempts at exercise and weight loss, cortisone injections and viscosupplementation.  Risks and benefits of surgery have been discussed, questions answered.   DESCRIPTION OF PROCEDURE: The patient identified by armband, received  IV antibiotics, in the holding area at [facility]. Patient taken to the operating room, appropriate anesthetic monitors were attached, and spinal anesthesia was  induced. IV Tranexamic acid was given. Lateral post and 2 surefoot positioners applied to the table, the lower extremity was then prepped and draped in usual sterile fashion from the toes to the high  thigh. Time-out procedure was performed. Greggory Stallion [facility], was present and scrubbed throughout the case, critical for assistance with, positioning, exposure, retraction, instrumentation, and closure.The skin and subcutaneous tissue along the incision was injected with 20 cc of a mixture of 20cc Exparel and 30cc Marcaine 50cc saline solution, using a 21-gauge by 1-1/2 inch needle. We began the operation, with the knee flexed 130 degrees, by making the anterior midline incision starting at handbreadth above the patella going over the patella 1 cm medial to and 4 cm distal to the tibial tubercle. Small bleeders in the skin and the subcutaneous tissue identified and cauterized. Transverse retinaculum was incised and reflected medially and a medial parapatellar arthrotomy was accomplished. the patella was everted and theprepatellar fat pad resected. The superficial medial collateral ligament was then elevated from anterior to posterior along the proximal flare of the tibia and anterior half of the menisci resected. The knee was hyperflexed exposing bone on bone arthritis. Peripheral  and notch osteophytes as well as the cruciate ligaments were then resected. We continued to work our way around posteriorly along the proximal tibia, and externally rotated the tibia subluxing it out from underneath the femur. A McHale PCL retractor was placed through the notch, a lateral Hohmann retractor, and anterolateral small homan retractor placed. We then entered the proximal tibia with the Depuy starter drill in line with the axis of the tibia followed by an intramedullary guide rod and 3-degree posterior slope cutting guide. The tibial cutting guide, was pinned into place allowing resection of 2 mm of bone medially and 10 mm of bone laterally. Satisfied with the tibial resection, we then entered the distal femur 2 mm anterior to the PCL origin with the starter drill, followed by the intramedullary guide rod and applied the distal  femoral cutting guide set at 9 mm, with 5 degrees of valgus. This was pinned along the epicondylar axis. At this point, the distal femoral cut was accomplished without difficulty. We then sized for a #5 femoral component and pinned the chamfer guide in 3 degrees of external rotation. The anterior, posterior, and chamfer cuts were accomplished without difficulty followed by the Attune RP box cutting guide and the box cut. We also removed posterior osteophytes from the posterior femoral condyles. The posterior capsule was injected with Exparel solution. The knee was brought into full extension. We checked our extension gap and fit a 8 mm trial lollipop. Distracting in extension with a lamina spreader,  bleeders in the posterior capsule, Posterior medial and posterior lateral gutter were cauterized.  The transexamic acid-soaked sponge was then placed in the gap of the knee in extension. The knee was flexed 30. The posterior patella cut was accomplished with the 9.5 mm Attune cutting guide, sized for a 35 mm dome, and the fixation pegs drilled.The knee was then once again hyperflexed exposing the proximal tibia. We sized for a # 5 tibial base plate, applied the smokestack and the conical reamer followed by the the Delta fin keel punch. We then hammered into place the Attune RP trial femoral component, drilled the lugs, inserted a  8 mm trial bearing, trial patellar button, and took the knee through range of motion from 0-130 degrees. Medial and lateral ligamentous stability was checked. No thumb pressure was required for patellar Tracking.  All trial components were removed, mating surfaces irrigated with pulse lavage, and dried with suction and sponges. 10 cc of the Exparel solution was applied to the cancellus bone of the patella distal femur and proximal tibia.  After waiting 30 seconds, the bony surfaces were again, dried with sponges. A double batch of DePuy HV cement was mixed and applied to all bony metallic  mating surfaces except for the posterior condyles of the femur itself. In order, we hammered into place the tibial tray and removed excess cement, the femoral component and removed excess cement. The final Attune RP bearing was inserted, and the knee brought to full extension with compression. The patellar button was clamped into place, and excess cement removed. The knee was held at 30 flexion with compression using the second surefoot, while the cement cured. The wound was irrigated out with normal saline solution pulse lavage. The rest of the Exparel was injected into the parapatellar arthrotomy, subcutaneous tissues, and periosteal tissues. The parapatellar arthrotomy was closed with running #1 Vicryl suture. The subcutaneous tissue with 3-0 undyed Vicryl suture, and the skin with running 3-0 SQ vicryl. An Aquacil dressing and Ace wrap  were applied. The patient was taken to recovery room without difficulty.   Harvie JuniorJohn L Wave Calzada 02/19/2023, 8:50 AM

## 2023-02-19 NOTE — Discharge Instructions (Addendum)

## 2023-02-19 NOTE — Anesthesia Procedure Notes (Signed)
Anesthesia Regional Block: Adductor canal block   Pre-Anesthetic Checklist: , timeout performed,  Correct Patient, Correct Site, Correct Laterality,  Correct Procedure, Correct Position, site marked,  Risks and benefits discussed,  Surgical consent,  Pre-op evaluation,  At surgeon's request and post-op pain management  Laterality: Right  Prep: chloraprep       Needles:  Injection technique: Single-shot  Needle Type: Echogenic Stimulator Needle     Needle Length: 9cm  Needle Gauge: 21     Additional Needles:   Procedures:,,,, ultrasound used (permanent image in chart),,    Narrative:  Start time: 02/19/2023 7:04 AM End time: 02/19/2023 7:06 AM Injection made incrementally with aspirations every 5 mL.  Performed by: Personally  Anesthesiologist: Linton Rump, MD  Additional Notes: Discussed risks and benefits of nerve block including, but not limited to, prolonged and/or permanent nerve injury involving sensory and/or motor function. Monitors were applied and a time-out was performed. The nerve and associated structures were visualized under ultrasound guidance. After negative aspiration, local anesthetic was slowly injected around the nerve. There was no evidence of high pressure during the procedure. There were no paresthesias. VSS remained stable and the patient tolerated the procedure well.

## 2023-02-19 NOTE — Interval H&P Note (Signed)
History and Physical Interval Note:  02/19/2023 7:28 AM  Alicia Herrera  has presented today for surgery, with the diagnosis of RIGHT KNEE DEGENERATIVE JOINT DISEASE.  The various methods of treatment have been discussed with the patient and family. After consideration of risks, benefits and other options for treatment, the patient has consented to  Procedure(s): TOTAL KNEE ARTHROPLASTY (Right) as a surgical intervention.  The patient's history has been reviewed, patient examined, no change in status, stable for surgery.  I have reviewed the patient's chart and labs.  Questions were answered to the patient's satisfaction.     [name]

## 2023-02-19 NOTE — Anesthesia Procedure Notes (Signed)
Spinal  Patient location during procedure: OR Start time: 02/19/2023 7:35 AM End time: 02/19/2023 7:38 AM Reason for block: surgical anesthesia Staffing Performed: resident/CRNA  Resident/CRNA: Doran Clay, CRNA Performed by: Doran Clay, CRNA Authorized by: Linton Rump, MD   Preanesthetic Checklist Completed: patient identified, IV checked, site marked, risks and benefits discussed, surgical consent, monitors and equipment checked, pre-op evaluation and timeout performed Spinal Block Patient position: sitting Prep: DuraPrep Patient monitoring: heart rate, cardiac monitor, blood pressure and continuous pulse ox Approach: midline Location: L3-4 Injection technique: single-shot Needle Needle type: Pencan  Needle gauge: 24 G Needle length: 10 cm Needle insertion depth: 7 cm Assessment Sensory level: T6 Events: CSF return Additional Notes Timeout performed. Patient in sitting position.L3-4 identified. Cleansed with Duraprep. SAB without difficulty. To supine position.

## 2023-02-19 NOTE — Anesthesia Postprocedure Evaluation (Signed)
Anesthesia Post Note  Patient: London Gabhart  Procedure(s) Performed: TOTAL KNEE ARTHROPLASTY (Right: Knee)     Patient location during evaluation: PACU Anesthesia Type: Regional, Spinal and MAC Level of consciousness: awake Pain management: pain level controlled Vital Signs Assessment: post-procedure vital signs reviewed and stable Respiratory status: spontaneous breathing, respiratory function stable and nonlabored ventilation Cardiovascular status: blood pressure returned to baseline and stable Postop Assessment: no headache, no backache and no apparent nausea or vomiting Anesthetic complications: no   No notable events documented.  Last Vitals:  Vitals:   02/19/23 1000 02/19/23 1015  BP: (!) 113/57 113/67  Pulse: 66 (!) 59  Resp: 10 12  Temp:  36.6 C  SpO2: 92% 96%    Last Pain:  Vitals:   02/19/23 1015  TempSrc:   PainSc: 0-No pain                 Linton Rump

## 2023-02-19 NOTE — Transfer of Care (Signed)
Immediate Anesthesia Transfer of Care Note  [name]  Procedure(s) Performed: TOTAL KNEE ARTHROPLASTY (Right: Knee)  Patient Location: PACU  Anesthesia Type:Spinal  Level of Consciousness: sedated  Airway & Oxygen Therapy: Patient Spontanous Breathing and Patient connected to face mask oxygen  Post-op Assessment: Report given to RN and Post -op Vital signs reviewed and stable  Post vital signs: Reviewed and stable  Last Vitals:  Vitals Value Taken Time  BP    Temp    Pulse 68 02/19/23 0937  Resp 10 02/19/23 0937  SpO2 93 % 02/19/23 0937  Vitals shown include unvalidated device data.  Last Pain:  Vitals:   02/19/23 0552  TempSrc: Oral         Complications: No notable events documented.

## 2023-02-19 NOTE — Evaluation (Signed)
Physical Therapy Evaluation Patient Details Name: Alicia Herrera MRN: [id number] DOB: 11/01/1946 Today's Date: 02/19/2023  History of Present Illness  77 yo female presents to therapy s/p R TKA on 02/19/2023 due to failure of conservative measures. Pt has PMH including but not limited to: CABG s/p valve replacement, AAA repair, HDL, and HTN.  Clinical Impression    Alicia Herrera is a 77 y.o. female POD 0 s/p R TKA. Patient reports IND with mobility at baseline. Patient is now limited by functional impairments (see PT problem list below) and requires min guard and cues for transfers and gait with RW. Patient was able to ambulate 60 feet with RW and min guard and cues for safe walker management. Patient educated on safe sequencing for stair mobility, pain management, car transfers use of CP/Ice pt and family verbalized understanding safe guarding position for people assisting with mobility. Patient instructed in exercises to facilitate ROM and circulation. Patient will benefit from continued skilled PT interventions to address impairments and progress towards PLOF. Patient has met mobility goals at adequate level for discharge home [facility]; will continue to follow if pt continues acute stay to progress towards Mod I goals.      Recommendations for follow up therapy are one component of a multi-disciplinary discharge planning process, led by the attending physician.  Recommendations may be updated based on patient status, additional functional criteria and insurance authorization.  Follow Up Recommendations       Assistance Recommended at Discharge Intermittent Supervision/Assistance  Patient can return home with the following  A little help with walking and/or transfers;A little help with bathing/dressing/bathroom;Assistance with cooking/housework;Assist for transportation;Help with stairs or ramp for entrance    Equipment Recommendations Rolling walker (2 wheels)  (provided and adjusted at eval)  Recommendations for Other Services       Functional Status Assessment Patient has had a recent decline in their functional status and demonstrates the ability to make significant improvements in function in a reasonable and predictable amount of time.     Precautions / Restrictions Precautions Precautions: Knee;Fall Restrictions Weight Bearing Restrictions: No      Mobility  Bed Mobility Overal bed mobility: Needs Assistance Bed Mobility: Supine to Sit     Supine to sit: Supervision     General bed mobility comments: min cues    Transfers Overall transfer level: Needs assistance Equipment used: Rolling walker (2 wheels) Transfers: Sit to/from Stand Sit to Stand: Min guard           General transfer comment: cues for proper UE and AD placement    Ambulation/Gait Ambulation/Gait assistance: Min guard Gait Distance (Feet): 60 Feet Assistive device: Rolling walker (2 wheels) Gait Pattern/deviations: Step-to pattern, Antalgic Gait velocity: decreased        Stairs Stairs: Yes Stairs assistance: Min guard Stair Management: One rail Left Number of Stairs: 2 General stair comments: 2 steps assessed with B handrial initially and pt able to peform step navigation with L handrial and min guard with cues for safety and sequencing  Wheelchair Mobility    Modified Rankin (Stroke Patients Only)       Balance Overall balance assessment: Needs assistance Sitting-balance support: Single extremity supported Sitting balance-Leahy Scale: Fair     Standing balance support: Bilateral upper extremity supported, Reliant on assistive device for balance, During functional activity Standing balance-Leahy Scale: Poor  Pertinent Vitals/Pain      Home Living Family/patient expects to be discharged to:: Private residence Living Arrangements: Alone Available Help at Discharge: Family Type of  Home: House Home Access: Stairs to enter Entrance Stairs-Rails: Left Entrance Stairs-Number of Steps: 3   Home Layout: Multi-level;Able to live on main level with bedroom/bathroom Home Equipment: Shower seat;Grab bars - tub/shower (toilet riser)      Prior Function Prior Level of Function : Independent/Modified Independent;Driving;Working/employed             Mobility Comments: IND with all ADLs, self care tasks and IADLs       Hand Dominance        Extremity/Trunk Assessment        Lower Extremity Assessment Lower Extremity Assessment: RLE deficits/detail RLE Deficits / Details: R ankle DF/PF 5/5, SLR < 10 degree lag RLE Sensation: WNL (pt reported some abn sensation R foot once in standing)    Cervical / Trunk Assessment Cervical / Trunk Assessment: Normal  Communication   Communication: No difficulties  Cognition Arousal/Alertness: Awake/alert Behavior During Therapy: WFL for tasks assessed/performed Overall Cognitive Status: Within Functional Limits for tasks assessed                                          General Comments      Exercises Total Joint Exercises Ankle Circles/Pumps: AROM, Both, 20 reps Quad Sets: AROM, Right, 5 reps Heel Slides: AROM, Right, 5 reps Hip ABduction/ADduction: AROM, Right, 5 reps Straight Leg Raises: AROM, Right, 5 reps Long Arc Quad: AROM, Right, 5 reps   Assessment/Plan    PT Assessment Patient needs continued PT services  PT Problem List Decreased strength;Decreased range of motion;Decreased activity tolerance;Decreased balance;Decreased mobility;Pain       PT Treatment Interventions DME instruction;Gait training;Stair training;Functional mobility training;Therapeutic activities;Therapeutic exercise;Balance training;Neuromuscular re-education;Patient/family education;Modalities    PT Goals (Current goals can be found in the Care Plan section)  Acute Rehab PT Goals Patient Stated Goal: to be able  to walk without pain and STS with no pain 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 PT Goal Formulation: With patient Time For Goal Achievement: 03/05/23 Potential to Achieve Goals: Good    Frequency 7X/week     Co-evaluation               AM-PAC PT "6 Clicks" Mobility  Outcome Measure Help needed turning from your back to your side while in a flat bed without using bedrails?: A Little Help needed moving from lying on your back to sitting on the side of a flat bed without using bedrails?: A Little Help needed moving to and from a bed to a chair (including a wheelchair)?: A Little Help needed standing up from a chair using your arms (e.g., wheelchair or bedside chair)?: A Little Help needed to walk in hospital room?: A Little Help needed climbing 3-5 steps with a railing? : A Little 6 Click Score: 18    End of Session Equipment Utilized During Treatment: Gait belt Activity Tolerance: Patient tolerated treatment well;No increased pain Patient left: in chair;with call bell/phone within reach;with family/visitor present Nurse Communication: Mobility status;Other (comment) (progression toward d/c) PT Visit Diagnosis: Unsteadiness on feet (R26.81);Other abnormalities of gait and mobility (R26.89);Muscle weakness (generalized) (M62.81);Difficulty in walking, not elsewhere classified (R26.2);Pain Pain - Right/Left: Right Pain - part of body: Knee    Time: 4098-1191 PT  Time Calculation (min) (ACUTE ONLY): 39 min   Charges:   PT Evaluation $PT Eval Low Complexity: 1 Low PT Treatments $Gait Training: 8-22 mins        Rica Mote, PT   Jacqualyn Posey 02/19/2023, 2:20 PM

## 2023-02-20 ENCOUNTER — Encounter (HOSPITAL_COMMUNITY): Payer: Self-pay | Admitting: Orthopedic Surgery

## 2023-02-20 DIAGNOSIS — I10 Essential (primary) hypertension: Secondary | ICD-10-CM | POA: Diagnosis not present

## 2023-02-20 DIAGNOSIS — E785 Hyperlipidemia, unspecified: Secondary | ICD-10-CM | POA: Diagnosis not present

## 2023-02-20 DIAGNOSIS — Z791 Long term (current) use of non-steroidal anti-inflammatories (NSAID): Secondary | ICD-10-CM | POA: Diagnosis not present

## 2023-02-20 DIAGNOSIS — E039 Hypothyroidism, unspecified: Secondary | ICD-10-CM | POA: Diagnosis not present

## 2023-02-20 DIAGNOSIS — I2510 Atherosclerotic heart disease of native coronary artery without angina pectoris: Secondary | ICD-10-CM | POA: Diagnosis not present

## 2023-02-20 DIAGNOSIS — M1712 Unilateral primary osteoarthritis, left knee: Secondary | ICD-10-CM | POA: Diagnosis not present

## 2023-02-20 DIAGNOSIS — N3281 Overactive bladder: Secondary | ICD-10-CM | POA: Diagnosis not present

## 2023-02-20 DIAGNOSIS — Z471 Aftercare following joint replacement surgery: Secondary | ICD-10-CM | POA: Diagnosis not present

## 2023-02-20 DIAGNOSIS — Z7982 Long term (current) use of aspirin: Secondary | ICD-10-CM | POA: Diagnosis not present

## 2023-02-22 DIAGNOSIS — Z471 Aftercare following joint replacement surgery: Secondary | ICD-10-CM | POA: Diagnosis not present

## 2023-02-22 DIAGNOSIS — M1712 Unilateral primary osteoarthritis, left knee: Secondary | ICD-10-CM | POA: Diagnosis not present

## 2023-02-22 DIAGNOSIS — N3281 Overactive bladder: Secondary | ICD-10-CM | POA: Diagnosis not present

## 2023-02-22 DIAGNOSIS — Z7982 Long term (current) use of aspirin: Secondary | ICD-10-CM | POA: Diagnosis not present

## 2023-02-22 DIAGNOSIS — I10 Essential (primary) hypertension: Secondary | ICD-10-CM | POA: Diagnosis not present

## 2023-02-22 DIAGNOSIS — Z791 Long term (current) use of non-steroidal anti-inflammatories (NSAID): Secondary | ICD-10-CM | POA: Diagnosis not present

## 2023-02-22 DIAGNOSIS — E039 Hypothyroidism, unspecified: Secondary | ICD-10-CM | POA: Diagnosis not present

## 2023-02-22 DIAGNOSIS — E785 Hyperlipidemia, unspecified: Secondary | ICD-10-CM | POA: Diagnosis not present

## 2023-02-22 DIAGNOSIS — I2510 Atherosclerotic heart disease of native coronary artery without angina pectoris: Secondary | ICD-10-CM | POA: Diagnosis not present

## 2023-02-23 ENCOUNTER — Other Ambulatory Visit: Payer: Self-pay | Admitting: Family Medicine

## 2023-02-23 DIAGNOSIS — E785 Hyperlipidemia, unspecified: Secondary | ICD-10-CM | POA: Diagnosis not present

## 2023-02-23 DIAGNOSIS — Z471 Aftercare following joint replacement surgery: Secondary | ICD-10-CM | POA: Diagnosis not present

## 2023-02-23 DIAGNOSIS — I10 Essential (primary) hypertension: Secondary | ICD-10-CM | POA: Diagnosis not present

## 2023-02-23 DIAGNOSIS — M1712 Unilateral primary osteoarthritis, left knee: Secondary | ICD-10-CM | POA: Diagnosis not present

## 2023-02-23 DIAGNOSIS — Z7982 Long term (current) use of aspirin: Secondary | ICD-10-CM | POA: Diagnosis not present

## 2023-02-23 DIAGNOSIS — N3281 Overactive bladder: Secondary | ICD-10-CM | POA: Diagnosis not present

## 2023-02-23 DIAGNOSIS — E039 Hypothyroidism, unspecified: Secondary | ICD-10-CM | POA: Diagnosis not present

## 2023-02-23 DIAGNOSIS — I2510 Atherosclerotic heart disease of native coronary artery without angina pectoris: Secondary | ICD-10-CM | POA: Diagnosis not present

## 2023-02-23 DIAGNOSIS — Z791 Long term (current) use of non-steroidal anti-inflammatories (NSAID): Secondary | ICD-10-CM | POA: Diagnosis not present

## 2023-02-26 DIAGNOSIS — Z7982 Long term (current) use of aspirin: Secondary | ICD-10-CM | POA: Diagnosis not present

## 2023-02-26 DIAGNOSIS — N3281 Overactive bladder: Secondary | ICD-10-CM | POA: Diagnosis not present

## 2023-02-26 DIAGNOSIS — Z471 Aftercare following joint replacement surgery: Secondary | ICD-10-CM | POA: Diagnosis not present

## 2023-02-26 DIAGNOSIS — I10 Essential (primary) hypertension: Secondary | ICD-10-CM | POA: Diagnosis not present

## 2023-02-26 DIAGNOSIS — Z791 Long term (current) use of non-steroidal anti-inflammatories (NSAID): Secondary | ICD-10-CM | POA: Diagnosis not present

## 2023-02-26 DIAGNOSIS — E785 Hyperlipidemia, unspecified: Secondary | ICD-10-CM | POA: Diagnosis not present

## 2023-02-26 DIAGNOSIS — I2510 Atherosclerotic heart disease of native coronary artery without angina pectoris: Secondary | ICD-10-CM | POA: Diagnosis not present

## 2023-02-26 DIAGNOSIS — E039 Hypothyroidism, unspecified: Secondary | ICD-10-CM | POA: Diagnosis not present

## 2023-02-26 DIAGNOSIS — M1712 Unilateral primary osteoarthritis, left knee: Secondary | ICD-10-CM | POA: Diagnosis not present

## 2023-02-27 ENCOUNTER — Other Ambulatory Visit: Payer: Self-pay

## 2023-02-27 ENCOUNTER — Emergency Department (HOSPITAL_BASED_OUTPATIENT_CLINIC_OR_DEPARTMENT_OTHER): Payer: Medicare HMO

## 2023-02-27 ENCOUNTER — Inpatient Hospital Stay (HOSPITAL_BASED_OUTPATIENT_CLINIC_OR_DEPARTMENT_OTHER)
Admission: EM | Admit: 2023-02-27 | Discharge: 2023-03-01 | DRG: 299 | Disposition: A | Discharge status: Home Health | Payer: Medicare HMO | Attending: Internal Medicine | Admitting: Internal Medicine

## 2023-02-27 ENCOUNTER — Encounter (HOSPITAL_BASED_OUTPATIENT_CLINIC_OR_DEPARTMENT_OTHER): Payer: Self-pay

## 2023-02-27 DIAGNOSIS — Z833 Family history of diabetes mellitus: Secondary | ICD-10-CM

## 2023-02-27 DIAGNOSIS — R011 Cardiac murmur, unspecified: Secondary | ICD-10-CM | POA: Diagnosis present

## 2023-02-27 DIAGNOSIS — Z952 Presence of prosthetic heart valve: Secondary | ICD-10-CM | POA: Diagnosis not present

## 2023-02-27 DIAGNOSIS — Z7982 Long term (current) use of aspirin: Secondary | ICD-10-CM | POA: Diagnosis not present

## 2023-02-27 DIAGNOSIS — M1712 Unilateral primary osteoarthritis, left knee: Secondary | ICD-10-CM | POA: Diagnosis present

## 2023-02-27 DIAGNOSIS — Z79899 Other long term (current) drug therapy: Secondary | ICD-10-CM

## 2023-02-27 DIAGNOSIS — Z8249 Family history of ischemic heart disease and other diseases of the circulatory system: Secondary | ICD-10-CM

## 2023-02-27 DIAGNOSIS — T81718A Complication of other artery following a procedure, not elsewhere classified, initial encounter: Secondary | ICD-10-CM | POA: Diagnosis not present

## 2023-02-27 DIAGNOSIS — E876 Hypokalemia: Secondary | ICD-10-CM

## 2023-02-27 DIAGNOSIS — Z86711 Personal history of pulmonary embolism: Secondary | ICD-10-CM

## 2023-02-27 DIAGNOSIS — I10 Essential (primary) hypertension: Secondary | ICD-10-CM | POA: Diagnosis present

## 2023-02-27 DIAGNOSIS — Z8679 Personal history of other diseases of the circulatory system: Secondary | ICD-10-CM | POA: Diagnosis not present

## 2023-02-27 DIAGNOSIS — J9601 Acute respiratory failure with hypoxia: Secondary | ICD-10-CM | POA: Diagnosis not present

## 2023-02-27 DIAGNOSIS — I2510 Atherosclerotic heart disease of native coronary artery without angina pectoris: Secondary | ICD-10-CM | POA: Diagnosis present

## 2023-02-27 DIAGNOSIS — I2609 Other pulmonary embolism with acute cor pulmonale: Secondary | ICD-10-CM | POA: Diagnosis present

## 2023-02-27 DIAGNOSIS — J9811 Atelectasis: Secondary | ICD-10-CM | POA: Diagnosis not present

## 2023-02-27 DIAGNOSIS — Z951 Presence of aortocoronary bypass graft: Secondary | ICD-10-CM | POA: Diagnosis not present

## 2023-02-27 DIAGNOSIS — E039 Hypothyroidism, unspecified: Secondary | ICD-10-CM | POA: Diagnosis not present

## 2023-02-27 DIAGNOSIS — Z96651 Presence of right artificial knee joint: Secondary | ICD-10-CM | POA: Diagnosis present

## 2023-02-27 DIAGNOSIS — I82409 Acute embolism and thrombosis of unspecified deep veins of unspecified lower extremity: Secondary | ICD-10-CM

## 2023-02-27 DIAGNOSIS — N3281 Overactive bladder: Secondary | ICD-10-CM | POA: Diagnosis present

## 2023-02-27 DIAGNOSIS — I82811 Embolism and thrombosis of superficial veins of right lower extremities: Secondary | ICD-10-CM | POA: Diagnosis present

## 2023-02-27 DIAGNOSIS — E785 Hyperlipidemia, unspecified: Secondary | ICD-10-CM | POA: Diagnosis not present

## 2023-02-27 DIAGNOSIS — Z8049 Family history of malignant neoplasm of other genital organs: Secondary | ICD-10-CM

## 2023-02-27 DIAGNOSIS — I119 Hypertensive heart disease without heart failure: Secondary | ICD-10-CM | POA: Diagnosis present

## 2023-02-27 DIAGNOSIS — I2699 Other pulmonary embolism without acute cor pulmonale: Secondary | ICD-10-CM | POA: Diagnosis not present

## 2023-02-27 DIAGNOSIS — E782 Mixed hyperlipidemia: Secondary | ICD-10-CM | POA: Diagnosis not present

## 2023-02-27 DIAGNOSIS — R0602 Shortness of breath: Secondary | ICD-10-CM | POA: Diagnosis not present

## 2023-02-27 HISTORY — DX: Acute embolism and thrombosis of unspecified deep veins of unspecified lower extremity: I82.409

## 2023-02-27 HISTORY — DX: Other pulmonary embolism without acute cor pulmonale: I26.99

## 2023-02-27 LAB — COMPREHENSIVE METABOLIC PANEL
ALT: 18 U/L (ref 0–44)
AST: 20 U/L (ref 15–41)
Albumin: 3.2 g/dL — ABNORMAL LOW (ref 3.5–5.0)
Alkaline Phosphatase: 69 U/L (ref 38–126)
Anion gap: 11 (ref 5–15)
BUN: 16 mg/dL (ref 8–23)
CO2: 26 mmol/L (ref 22–32)
Calcium: 8.5 mg/dL — ABNORMAL LOW (ref 8.9–10.3)
Chloride: 95 mmol/L — ABNORMAL LOW (ref 98–111)
Creatinine, Ser: 0.88 mg/dL (ref 0.44–1.00)
GFR, Estimated: >60 mL/min (ref >60)
Glucose, Bld: 134 mg/dL — ABNORMAL HIGH (ref 70–99)
Potassium: 3.1 mmol/L — ABNORMAL LOW (ref 3.5–5.1)
Sodium: 132 mmol/L — ABNORMAL LOW (ref 135–145)
Total Bilirubin: 0.8 mg/dL (ref 0.3–1.2)
Total Protein: 6.7 g/dL (ref 6.5–8.1)

## 2023-02-27 LAB — CBC WITH DIFFERENTIAL/PLATELET
Abs Immature Granulocytes: 0.09 10*3/uL — ABNORMAL HIGH (ref 0.00–0.07)
Basophils Absolute: 0 10*3/uL (ref 0.0–0.1)
Basophils Relative: 0 %
Eosinophils Absolute: 0.3 10*3/uL (ref 0.0–0.5)
Eosinophils Relative: 2 %
HCT: 29.8 % — ABNORMAL LOW (ref 36.0–46.0)
Hemoglobin: 9.8 g/dL — ABNORMAL LOW (ref 12.0–15.0)
Immature Granulocytes: 1 %
Lymphocytes Relative: 10 %
Lymphs Abs: 1.2 10*3/uL (ref 0.7–4.0)
MCH: 27.5 pg (ref 26.0–34.0)
MCHC: 32.9 g/dL (ref 30.0–36.0)
MCV: 83.7 fL (ref 80.0–100.0)
Monocytes Absolute: 1.1 10*3/uL — ABNORMAL HIGH (ref 0.1–1.0)
Monocytes Relative: 9 %
Neutro Abs: 9.8 10*3/uL — ABNORMAL HIGH (ref 1.7–7.7)
Neutrophils Relative %: 78 %
Platelets: 269 10*3/uL (ref 150–400)
RBC: 3.56 MIL/uL — ABNORMAL LOW (ref 3.87–5.11)
RDW: 14.7 % (ref 11.5–15.5)
WBC: 12.4 10*3/uL — ABNORMAL HIGH (ref 4.0–10.5)
nRBC: 0 % (ref 0.0–0.2)

## 2023-02-27 LAB — TROPONIN I (HIGH SENSITIVITY)
Troponin I (High Sensitivity): 6 ng/L (ref <18)
Troponin I (High Sensitivity): 7 ng/L (ref <18)

## 2023-02-27 LAB — PROTIME-INR
INR: 1.2 (ref 0.8–1.2)
Prothrombin Time: 14.7 seconds (ref 11.4–15.2)

## 2023-02-27 LAB — BRAIN NATRIURETIC PEPTIDE: B Natriuretic Peptide: 141.4 pg/mL — ABNORMAL HIGH (ref 0.0–100.0)

## 2023-02-27 MED ORDER — ONDANSETRON HCL 4 MG PO TABS: 4.0000 mg | ORAL_TABLET | Freq: Four times a day (QID) | ORAL | Status: DC | PRN

## 2023-02-27 MED ORDER — POTASSIUM CHLORIDE CRYS ER 20 MEQ PO TBCR
40.0000 meq | EXTENDED_RELEASE_TABLET | Freq: Once | ORAL | Status: AC
  Administered 2023-02-27: 40 meq via ORAL
  Filled 2023-02-27: qty 2

## 2023-02-27 MED ORDER — HEPARIN (PORCINE) 25000 UT/250ML-% IV SOLN
1700.0000 [IU]/h | INTRAVENOUS | Status: DC
  Administered 2023-02-27: 1250 [IU]/h via INTRAVENOUS
  Administered 2023-02-28: 1500 [IU]/h via INTRAVENOUS
  Administered 2023-02-28: 1700 [IU]/h via INTRAVENOUS
  Filled 2023-02-27 (×4): qty 250

## 2023-02-27 MED ORDER — IOHEXOL 350 MG/ML SOLN
100.0000 mL | Freq: Once | INTRAVENOUS | Status: AC | PRN
  Administered 2023-02-27: 100 mL via INTRAVENOUS

## 2023-02-27 MED ORDER — OXYCODONE-ACETAMINOPHEN 5-325 MG PO TABS
1.0000 | ORAL_TABLET | ORAL | Status: DC | PRN
  Administered 2023-02-27 – 2023-02-28 (×3): 1 via ORAL
  Filled 2023-02-27 (×3): qty 1

## 2023-02-27 MED ORDER — ALBUTEROL SULFATE HFA 108 (90 BASE) MCG/ACT IN AERS: 2.0000 | INHALATION_SPRAY | RESPIRATORY_TRACT | Status: DC | PRN

## 2023-02-27 MED ORDER — ACETAMINOPHEN 650 MG RE SUPP: 650.0000 mg | Freq: Four times a day (QID) | RECTAL | Status: DC | PRN

## 2023-02-27 MED ORDER — ACETAMINOPHEN 325 MG PO TABS: 650.0000 mg | ORAL_TABLET | Freq: Four times a day (QID) | ORAL | Status: DC | PRN

## 2023-02-27 MED ORDER — ONDANSETRON HCL 4 MG/2ML IJ SOLN: 4.0000 mg | Freq: Four times a day (QID) | INTRAMUSCULAR | Status: DC | PRN

## 2023-02-27 MED ORDER — HEPARIN BOLUS VIA INFUSION
4700.0000 [IU] | Freq: Once | INTRAVENOUS | Status: AC
  Administered 2023-02-27: 4700 [IU] via INTRAVENOUS

## 2023-02-27 NOTE — ED Triage Notes (Signed)
Pt R total knee replacement last week, endorses R sided SHOB/ CP, states it's "just under my bra, goes down & around to my back." Oxycodone "doesn't take all the breathing pain away."  Worse when laying, better when up & walking.

## 2023-02-27 NOTE — Assessment & Plan Note (Addendum)
Cont repatha

## 2023-02-27 NOTE — Assessment & Plan Note (Signed)
Provoked in setting of TKA a mere 8 days ago. Heparin gtt Tele monitor 2d echo BLE venous duplex Day team can consider Pulm consult in AM: however, with neg trop, no tachycardia, no hypotension, and just 2L O2 requirement.  Im thinking that they are probably not going to want to do any sort of catheter directed lysis a mere 8 days out from surgery.

## 2023-02-27 NOTE — Progress Notes (Signed)
ANTICOAGULATION CONSULT NOTE - Initial Consult  Pharmacy Consult for heparin Indication: pulmonary embolus  No Known Allergies  Patient Measurements: Height:  (170.2 cm) Weight: 81.6 kg (180 lb) IBW/kg (Calculated) : 61.6 Heparin Dosing Weight: 78.4kg  Vital Signs: Temp: 98.3 F (36.8 C) (04/16 1515) Temp Source: Oral (04/16 1328) BP: 121/63 (04/16 1515) Pulse Rate: 72 (04/16 1515)  Labs: Recent Labs    02/27/23 1426  HGB 9.8*  HCT 29.8*  PLT 269  LABPROT 14.7  INR 1.2  CREATININE 0.88  TROPONINIHS 6    Estimated Creatinine Clearance: 57.9 mL/min (by C-G formula based on SCr of 0.88 mg/dL).   Medical History: Past Medical History:  Diagnosis Date   Arthritis    Coronary artery disease    Heart murmur    Hx of CABG    Hypertension    Hypothyroidism    hx of 25 years ago per pt   Rectocele    Uterine prolapse     Medications:  Infusions:   heparin      Assessment: 69 yof presented to the ED with SOB and CP. Found to have a PE and now starting IV heparin. Baseline Hgb is low at 9.8 but platelets are WNL. She is not on anticoagulation PTA.   Goal of Therapy:  Heparin level 0.3-0.7 units/ml Monitor platelets by anticoagulation protocol: Yes   Plan:  Heparin bolus 4700 units IV x 1 Heparin gtt 1250 units/hr Check an 8 hr heparin level Daily heparin level and CBC  Mayes Sangiovanni, Drake Leach 02/27/2023,3:50 PM

## 2023-02-27 NOTE — Progress Notes (Signed)
Plan of Care Note for accepted transfer   [name] MRN: [id number]   DOA: 02/27/2023  Facility requesting transfer: [facility] Requesting Provider: Wallace Cullens Reason for transfer: SOB  Facility course: Patient with h/o CAD s/p CABG, AAA s/p repair, HTN, and hypothyroidism presenting with SOB and CP following TKR on 4/8.    CT with submassive PE with R heart strain.  BNP and troponin negative.  O2 sats marginal.  On Heparin.       Plan of care: The patient is accepted for admission to Progressive unit, at [facility].   Author: Jonah Blue, MD 02/27/2023  Check [email or web address] for on-call coverage.  Nursing staff, Please call [facility] Admits & Consults System-Wide number on Amion as soon as patient's arrival, so appropriate admitting provider can evaluate the pt.

## 2023-02-27 NOTE — ED Notes (Signed)
Called [facility] for transport to [facility] :34pm

## 2023-02-27 NOTE — ED Notes (Signed)
Patient placed on [facility]. SAT 89-90% while talking on the phone.

## 2023-02-27 NOTE — ED Notes (Signed)
Attempted to call report, was told CN needed to review pt prior to accepting report.

## 2023-02-27 NOTE — ED Notes (Signed)
RN called from floor for reports, Annabelle Harman

## 2023-02-27 NOTE — ED Provider Notes (Signed)
  Provider Note MRN:  [id number]  Arrival date & time: 02/27/23    ED Course and Medical Decision Making  Assumed care from [place] at shift change.  See note from prior team for complete details, in brief:  Clinical Course as of 02/27/23 1617  Tue Feb 27, 2023  1534 77yo fem, pending CTA, hx CABG, knee replacement recently. CP +DIB +pleuritic, sent by ortho for CT. Well appearing overall. CT to dispo [SG]  1549 K low, replaced (3.1) [SG]  1549 Hemoglobin(!): 9.8 S/p right total knee 8 days ago [SG]    Clinical Course User Index [SG] Sloan Leiter, DO   S/p right total knee 4/8 Dr Luiz Blare  Assumed care at 1530 Pt appears to have b/l segmental PE on wet read, received cb from radiology, RHS w/ b/l segmental PE noted Starting heparin Vital signs stable, no trop elev, no BNP elev, on room air, not tachycardic Plan admit  Pt started on [facility], not hypoxic  D/w Dr Ophelia Charter who accepts pt for admit  .Critical Care  Performed by: Sloan Leiter, DO Authorized by: Sloan Leiter, DO   Critical care provider statement:    Critical care time (minutes):  31   Critical care time was exclusive of:  Separately billable procedures and treating other patients   Critical care was necessary to treat or prevent imminent or life-threatening deterioration of the following conditions:  Circulatory failure   Critical care was time spent personally by me on the following activities:  Development of treatment plan with patient or surrogate, discussions with consultants, evaluation of patient's response to treatment, examination of patient, ordering and review of laboratory studies, ordering and review of radiographic studies, ordering and performing treatments and interventions, pulse oximetry, re-evaluation of patient's condition and review of old charts   Care discussed with: admitting provider     Final Clinical Impressions(s) / ED Diagnoses     ICD-10-CM   1. Acute pulmonary embolism with acute  cor pulmonale, unspecified pulmonary embolism type  I26.09     2. Hypokalemia  E87.6       ED Discharge Orders     None       Discharge Instructions   None        Sloan Leiter, DO 02/27/23 1618

## 2023-02-27 NOTE — ED Provider Notes (Signed)
[facility] Provider Note  CSN: [id number] Arrival date & time: 02/27/23 1323  Chief Complaint(s) Shortness of Breath  HPI Alicia Herrera is a 77 y.o. female history of coronary artery disease, hypertension, knee surgery a few days ago presenting to the emergency department with chest pain.  Reports chest pain is in the right side of her chest.  It is pleuritic.,  Wraps around.  She reports mild shortness of breath which is worse on exertion.  Chest pain is also worse with movement.  She has some swelling in her right leg after her surgery but denies any significant pain.  No fevers or chills.  No productive cough.  No abdominal pain.  No syncope.   Past Medical History Past Medical History:  Diagnosis Date   Arthritis    Coronary artery disease    Heart murmur    Hx of CABG    Hypertension    Hypothyroidism    hx of 25 years ago per pt   Rectocele    Uterine prolapse    Patient Active Problem List   Diagnosis Date Noted   Primary osteoarthritis of right knee 02/19/2023   Status post total knee replacement, right 02/19/2023   Cyst of skin 11/18/2021   Primary osteoarthritis of both knees 05/18/2020   Uterine prolapse 03/26/2019   Rectocele 03/26/2019   Postmenopausal bleeding 03/26/2019   OAB (overactive bladder) 03/26/2019   Midline cystocele 03/26/2019   S/P CABG (coronary artery bypass graft) 11/10/2013   Essential hypertension 11/10/2013   S/P ascending aortic aneurysm repair 10/10/2013   Hyperlipidemia 08/15/2013   Home Medication(s) Prior to Admission medications   Medication Sig Start Date End Date Taking? Authorizing Provider  amLODipine (NORVASC) 5 MG tablet Take 1 tablet by mouth once daily 02/08/23   [name], DO  aspirin EC 325 MG tablet Take 1 tablet (325 mg total) by mouth 2 (two) times daily after a meal. 02/19/23 02/19/24  Shanon Payor, PA-C  celecoxib (CELEBREX) 200 MG capsule Take 1 capsule (200 mg  total) by mouth 2 (two) times daily. 02/19/23 02/19/24  Shanon Payor, PA-C  docusate sodium (COLACE) 100 MG capsule Take 1 capsule (100 mg total) by mouth 2 (two) times daily as needed (for constipation). 02/19/23 02/19/24  Shanon Payor, PA-C  Evolocumab (REPATHA SURECLICK) 140 MG/ML SOAJ Inject 140 mg into the skin every 14 (fourteen) days. 01/30/23   [provider]  hydrochlorothiazide (HYDRODIURIL) 12.5 MG tablet Take 1 tablet by mouth once daily 02/23/23   [name], DO  oxyCODONE (ROXICODONE) 5 MG immediate release tablet Take 1-2 tablets (5-10 mg total) by mouth every 4 (four) hours as needed for severe pain. 02/19/23   Shanon Payor, PA-C  Past Surgical History Past Surgical History:  Procedure Laterality Date   CARDIAC VALVE REPLACEMENT     AVR   CORONARY ARTERY BYPASS GRAFT     prolapsed uterus surgery      TOTAL KNEE ARTHROPLASTY Right 02/19/2023   Procedure: TOTAL KNEE ARTHROPLASTY;  Surgeon: Jodi Geralds, MD;  Location: [facility];  Service: Orthopedics;  Laterality: Right;   Family History Family History  Problem Relation Age of Onset   Hypertension Mother    Heart attack Mother    Cancer Father    Penile cancer Father    Diabetes Sister     Social History Social History   Tobacco Use   Smoking status: Never   Smokeless tobacco: Never  Vaping Use   Vaping Use: Never used  Substance Use Topics   Alcohol use: Yes    Alcohol/week: 1.0 standard drink of alcohol    Types: 1 Glasses of wine per week    Comment: Occasionally   Drug use: Never   Allergies Patient has no known allergies.  Review of Systems Review of Systems  All other systems reviewed and are negative.   Physical Exam Vital Signs  I have reviewed the triage vital signs BP 121/63   Pulse 72   Temp 98.3 F (36.8 C)   Resp 13   Ht 5\' 7"  (1.702 m)   Wt 81.6  kg   SpO2 95%   BMI 28.19 kg/m  Physical Exam Vitals and nursing note reviewed.  Constitutional:      General: She is not in acute distress.    Appearance: She is well-developed.  HENT:     Head: Normocephalic and atraumatic.     Mouth/Throat:     Mouth: Mucous membranes are moist.  Eyes:     Pupils: Pupils are equal, round, and reactive to light.  Cardiovascular:     Rate and Rhythm: Normal rate and regular rhythm.     Heart sounds: No murmur heard. Pulmonary:     Effort: Pulmonary effort is normal. No respiratory distress.     Breath sounds: Normal breath sounds.  Chest:     Chest wall: Tenderness (right lower chest wall reproduces pain) present.  Abdominal:     General: Abdomen is flat.     Palpations: Abdomen is soft.     Tenderness: There is no abdominal tenderness.  Musculoskeletal:        General: No tenderness.     Right lower leg: No edema.     Left lower leg: No edema.     Comments: Small amount of bruising over the right anterior thigh.  No significant edema, erythema.  Skin:    General: Skin is warm and dry.  Neurological:     General: No focal deficit present.     Mental Status: She is alert. Mental status is at baseline.  Psychiatric:        Mood and Affect: Mood normal.        Behavior: Behavior normal.     ED Results and Treatments Labs (all labs ordered are listed, but only abnormal results are displayed) Labs Reviewed  COMPREHENSIVE METABOLIC PANEL - Abnormal; Notable for the following components:      Result Value   Sodium 132 (*)    Potassium 3.1 (*)    Chloride 95 (*)    Glucose, Bld 134 (*)    Calcium 8.5 (*)    Albumin 3.2 (*)    All other components within normal limits  CBC  WITH DIFFERENTIAL/PLATELET - Abnormal; Notable for the following components:   WBC 12.4 (*)    RBC 3.56 (*)    Hemoglobin 9.8 (*)    HCT 29.8 (*)    Neutro Abs 9.8 (*)    Monocytes Absolute 1.1 (*)    Abs Immature Granulocytes 0.09 (*)    All other components  within normal limits  PROTIME-INR  BRAIN NATRIURETIC PEPTIDE  TROPONIN I (HIGH SENSITIVITY)  TROPONIN I (HIGH SENSITIVITY)                                                                                                                          Radiology No results found.  Pertinent labs & imaging results that were available during my care of the patient were reviewed by me and considered in my medical decision making (see MDM for details).  Medications Ordered in ED Medications  albuterol (VENTOLIN HFA) 108 (90 Base) MCG/ACT inhaler 2 puff (has no administration in time range)  iohexol (OMNIPAQUE) 350 MG/ML injection 100 mL (100 mLs Intravenous Contrast Given 02/27/23 1526)                                                                                                                                     Procedures .Critical Care  Performed by: Lonell Grandchild, MD Authorized by: Lonell Grandchild, MD   Critical care provider statement:    Critical care time (minutes):  30   Critical care was time spent personally by me on the following activities:  Development of treatment plan with patient or surrogate, discussions with consultants, evaluation of patient's response to treatment, examination of patient, ordering and review of laboratory studies, ordering and review of radiographic studies, ordering and performing treatments and interventions, pulse oximetry, re-evaluation of patient's condition and review of old charts   Care discussed with: admitting provider     (including critical care time)  Medical Decision Making / ED Course   MDM:  77 year old female presenting to the emergency department with chest pain.  Vitals with borderline tachycardia. No true hypoxia but borderline low.  CT angiography chest obtained as patient extremely high risk for pulmonary embolism.  This clearly does show pulmonary embolism.  There is signs of right heart strain on CT angiography.  Her  troponin and BNP are both normal and she is not hemodynamic unstable.  Given CT findings patient will need  to be admitted for further management including echocardiogram and heparin.  Heparin has been initiated.  Will also order bilateral lower extremity DVTs.  Signed out to oncoming provider Dr. Wallace Cullens pending admission.  Clinical Course as of 02/27/23 1548  Tue Feb 27, 2023  1534 77yo fem, pending CTA, hx CABG, knee replacement recently. CP +DIB +pleuritic, sent by ortho for CT. Well appearing overall. CT to dispo [SG]    Clinical Course User Index [SG] Sloan Leiter, DO     Additional history obtained: -Additional history obtained from friend -External records from outside source obtained and reviewed including: Chart review including previous notes, labs, imaging, consultation notes including admit for orthopedic surgery   Lab Tests: -I ordered, reviewed, and interpreted labs.   The pertinent results include:   Labs Reviewed  COMPREHENSIVE METABOLIC PANEL - Abnormal; Notable for the following components:      Result Value   Sodium 132 (*)    Potassium 3.1 (*)    Chloride 95 (*)    Glucose, Bld 134 (*)    Calcium 8.5 (*)    Albumin 3.2 (*)    All other components within normal limits  CBC WITH DIFFERENTIAL/PLATELET - Abnormal; Notable for the following components:   WBC 12.4 (*)    RBC 3.56 (*)    Hemoglobin 9.8 (*)    HCT 29.8 (*)    Neutro Abs 9.8 (*)    Monocytes Absolute 1.1 (*)    Abs Immature Granulocytes 0.09 (*)    All other components within normal limits  PROTIME-INR  BRAIN NATRIURETIC PEPTIDE  TROPONIN I (HIGH SENSITIVITY)  TROPONIN I (HIGH SENSITIVITY)    Notable for normal troponin  EKG   EKG Interpretation  Date/Time:  Tuesday February 27 2023 13:34:23 EDT Ventricular Rate:  84 PR Interval:  188 QRS Duration: 95 QT Interval:  383 QTC Calculation: 453 R Axis:   3 Text Interpretation: Sinus rhythm Abnormal T, consider ischemia, lateral leads No  previous ECGs available Confirmed by Alvino Blood ([id number]) on 02/27/2023 1:59:45 PM         Imaging Studies ordered: I ordered imaging studies including CTA chest On my interpretation imaging demonstrates pulmonary embolism with R heart strain I independently visualized and interpreted imaging. I agree with the radiologist interpretation   Medicines ordered and prescription drug management: Meds ordered this encounter  Medications   albuterol (VENTOLIN HFA) 108 (90 Base) MCG/ACT inhaler 2 puff   iohexol (OMNIPAQUE) 350 MG/ML injection 100 mL    -I have reviewed the patients home medicines and have made adjustments as needed   Cardiac Monitoring: The patient was maintained on a cardiac monitor.  I personally viewed and interpreted the cardiac monitored which showed an underlying rhythm of: NSR  Reevaluation: After the interventions noted above, I reevaluated the patient and found that their symptoms have improved  Co morbidities that complicate the patient evaluation  Past Medical History:  Diagnosis Date   Arthritis    Coronary artery disease    Heart murmur    Hx of CABG    Hypertension    Hypothyroidism    hx of 25 years ago per pt   Rectocele    Uterine prolapse       Dispostion: Disposition decision including need for hospitalization was considered, and patient admitted to the hospital.    Final Clinical Impression(s) / ED Diagnoses Final diagnoses:  Acute pulmonary embolism with acute cor pulmonale, unspecified pulmonary embolism type     This chart  was dictated using voice recognition software.  Despite best efforts to proofread,  errors can occur which can change the documentation meaning.    Lonell Grandchild, MD 02/27/23 [phone]

## 2023-02-27 NOTE — H&P (Shared)
History and Physical    [name] ZOX:[id number] DOB: Jul 06, 1946 DOA: 02/27/2023 DOS: the patient was seen and examined on 02/27/2023 PCP: [name], DO  Patient coming from: {Point_of_Origin:[address]}  Chief Complaint:  Chief Complaint  Patient presents with   Shortness of Breath   HPI: Alicia Herrera is a 77 y.o. female with medical history significant of ***  Review of Systems: {ROS_Text:26778} Past Medical History:  Diagnosis Date   Arthritis    Coronary artery disease    Heart murmur    Hx of CABG    Hypertension    Hypothyroidism    hx of 25 years ago per pt   Rectocele    Uterine prolapse    Past Surgical History:  Procedure Laterality Date   CARDIAC VALVE REPLACEMENT     AVR   CORONARY ARTERY BYPASS GRAFT     prolapsed uterus surgery      TOTAL KNEE ARTHROPLASTY Right 02/19/2023   Procedure: TOTAL KNEE ARTHROPLASTY;  Surgeon: Jodi Geralds, MD;  Location: [facility];  Service: Orthopedics;  Laterality: Right;   Social History:  reports that she has never smoked. She has never used smokeless tobacco. She reports current alcohol use of about 1.0 standard drink of alcohol per week. She reports that she does not use drugs.  No Known Allergies  Family History  Problem Relation Age of Onset   Hypertension Mother    Heart attack Mother    Cancer Father    Penile cancer Father    Diabetes Sister     Prior to Admission medications   Medication Sig Start Date End Date Taking? Authorizing Provider  amLODipine (NORVASC) 5 MG tablet Take 1 tablet by mouth once daily 02/08/23   [name], DO  aspirin EC 325 MG tablet Take 1 tablet (325 mg total) by mouth 2 (two) times daily after a meal. 02/19/23 02/19/24  Shanon Payor, PA-C  celecoxib (CELEBREX) 200 MG capsule Take 1 capsule (200 mg total) by mouth 2 (two) times daily. 02/19/23 02/19/24  Shanon Payor, PA-C  docusate sodium (COLACE) 100 MG capsule Take 1 capsule (100 mg total) by mouth 2 (two) times daily as  needed (for constipation). 02/19/23 02/19/24  Shanon Payor, PA-C  Evolocumab (REPATHA SURECLICK) 140 MG/ML SOAJ Inject 140 mg into the skin every 14 (fourteen) days. 01/30/23   [provider]  hydrochlorothiazide (HYDRODIURIL) 12.5 MG tablet Take 1 tablet by mouth once daily 02/23/23   [name], DO  oxyCODONE (ROXICODONE) 5 MG immediate release tablet Take 1-2 tablets (5-10 mg total) by mouth every 4 (four) hours as needed for severe pain. 02/19/23   Shanon Payor, PA-C    Physical Exam: Vitals:   02/27/23 1615 02/27/23 1700 02/27/23 1850 02/27/23 2015  BP: (!) 138/54 (!) 130/112 134/63 (!) 135/59  Pulse: 80 85 84 88  Resp: Temp:  98.3 F (36.8 C)  99.2 F (37.3 C)  TempSrc:    Oral  SpO2: 96% 97% 95% 95%  Weight:    81.6 kg  Height:       *** Data Reviewed: {Tip this will not be part of the note when signed- Document your independent interpretation of telemetry tracing, EKG, lab, Radiology test or any other diagnostic tests. Add any new diagnostic test ordered today. (Optional):26781} {Results:26384}  Assessment and Plan: * Pulmonary embolism with acute cor pulmonale Provoked in setting of TKA a mere 8 days ago. Heparin gtt Tele monitor 2d echo  BLE venous duplex Day team can consider Pulm consult in AM: however, with neg trop, no tachycardia, no hypotension, and just 2L O2 requirement.  Im thinking that they are probably not going to want to do any sort of catheter directed lysis a mere 8 days out from surgery.  Essential hypertension Holding home BP meds for the moment in setting of acute PE with RHS.  Hyperlipidemia Cont repatha      Advance Care Planning:   Code Status: Full Code ***  Consults: ***  Family Communication: ***  Severity of Illness: {Observation/Inpatient:21159}  Author: Hillary Bow., DO 02/27/2023 11:37 PM  For on call review [email or web address].

## 2023-02-27 NOTE — Assessment & Plan Note (Signed)
Holding home BP meds for the moment in setting of acute PE with RHS.

## 2023-02-28 ENCOUNTER — Inpatient Hospital Stay (HOSPITAL_COMMUNITY): Payer: Medicare HMO

## 2023-02-28 ENCOUNTER — Other Ambulatory Visit (HOSPITAL_COMMUNITY): Payer: Self-pay

## 2023-02-28 DIAGNOSIS — I10 Essential (primary) hypertension: Secondary | ICD-10-CM | POA: Diagnosis not present

## 2023-02-28 DIAGNOSIS — J9601 Acute respiratory failure with hypoxia: Secondary | ICD-10-CM | POA: Diagnosis not present

## 2023-02-28 DIAGNOSIS — I2609 Other pulmonary embolism with acute cor pulmonale: Secondary | ICD-10-CM

## 2023-02-28 DIAGNOSIS — E782 Mixed hyperlipidemia: Secondary | ICD-10-CM | POA: Diagnosis not present

## 2023-02-28 LAB — BASIC METABOLIC PANEL
Anion gap: 10 (ref 5–15)
BUN: 13 mg/dL (ref 8–23)
CO2: 24 mmol/L (ref 22–32)
Calcium: 8.3 mg/dL — ABNORMAL LOW (ref 8.9–10.3)
Chloride: 101 mmol/L (ref 98–111)
Creatinine, Ser: 0.81 mg/dL (ref 0.44–1.00)
GFR, Estimated: >60 mL/min (ref >60)
Glucose, Bld: 113 mg/dL — ABNORMAL HIGH (ref 70–99)
Potassium: 3.3 mmol/L — ABNORMAL LOW (ref 3.5–5.1)
Sodium: 135 mmol/L (ref 135–145)

## 2023-02-28 LAB — ECHOCARDIOGRAM COMPLETE
AR max vel: 1.19 cm2
AV Area VTI: 1.3 cm2
AV Area mean vel: 1.23 cm2
AV Mean grad: 17 mmHg
AV Peak grad: 28.7 mmHg
Ao pk vel: 2.68 m/s
Area-P 1/2: 2.13 cm2
Height: 67 in
MV M vel: 1.69 m/s
MV Peak grad: 11.4 mmHg
MV VTI: 2.79 cm2
S' Lateral: 2.7 cm
Weight: 2880 oz

## 2023-02-28 LAB — CBC
HCT: 28.3 % — ABNORMAL LOW (ref 36.0–46.0)
Hemoglobin: 9.5 g/dL — ABNORMAL LOW (ref 12.0–15.0)
MCH: 27.8 pg (ref 26.0–34.0)
MCHC: 33.6 g/dL (ref 30.0–36.0)
MCV: 82.7 fL (ref 80.0–100.0)
Platelets: 282 10*3/uL (ref 150–400)
RBC: 3.42 MIL/uL — ABNORMAL LOW (ref 3.87–5.11)
RDW: 14.7 % (ref 11.5–15.5)
WBC: 10.8 10*3/uL — ABNORMAL HIGH (ref 4.0–10.5)
nRBC: 0 % (ref 0.0–0.2)

## 2023-02-28 LAB — HEPARIN LEVEL (UNFRACTIONATED)
Heparin Unfractionated: 0.17 IU/mL — ABNORMAL LOW (ref 0.30–0.70)
Heparin Unfractionated: 0.24 IU/mL — ABNORMAL LOW (ref 0.30–0.70)
Heparin Unfractionated: 0.38 IU/mL (ref 0.30–0.70)

## 2023-02-28 MED ORDER — HEPARIN BOLUS VIA INFUSION
3000.0000 [IU] | Freq: Once | INTRAVENOUS | Status: AC
  Administered 2023-02-28: 3000 [IU] via INTRAVENOUS
  Filled 2023-02-28: qty 3000

## 2023-02-28 NOTE — TOC Benefit Eligibility Note (Signed)
Patient Advocate Encounter  Insurance verification completed.    The patient is currently admitted and upon discharge could be taking Eliquis 5 mg.  The current 30 day co-pay is $45.00.   The patient is currently admitted and upon discharge could be taking Xarelto 20 mg.  The current 30 day co-pay is $45.00.   The patient is insured through [employer] Part D   This test claim was processed through [facility]- copay amounts may vary at other pharmacies due to pharmacy/plan contracts, or as the patient moves through the different stages of their insurance plan.  Chao Blazejewski, CPHT Pharmacy Patient Advocate Specialist [facility] Patient Advocate Team Direct Number: [phone]  Fax: [phone]       

## 2023-02-28 NOTE — Care Plan (Signed)
Patient is in the ortho bundle program. She is open to [facility] care. Will follow to assist with discharge needs. Please contact if needed   Shauna Hugh, [facility]  [phone]

## 2023-02-28 NOTE — Progress Notes (Addendum)
ANTICOAGULATION CONSULT NOTE -   Pharmacy Consult for heparin Indication: pulmonary embolus  No Known Allergies  Patient Measurements: Height:  (170.2 cm) Weight: 81.6 kg (180 lb) IBW/kg (Calculated) : 61.6 Heparin Dosing Weight: 78.4kg  Vital Signs: Temp: 98.3 F (36.8 C) (04/17 0410) Temp Source: Oral (04/17 0410) BP: 120/54 (04/17 0410) Pulse Rate: 72 (04/17 0410)  Labs: Recent Labs    02/27/23 1426 02/27/23 1612 02/28/23 0018 02/28/23 0200 02/28/23 0831  HGB 9.8*  --   --  9.5*  --   HCT 29.8*  --   --  28.3*  --   PLT 269  --   --  282  --   LABPROT 14.7  --   --   --   --   INR 1.2  --   --   --   --   HEPARINUNFRC  --   --  0.17*  --  0.38  CREATININE 0.88  --   --  0.81  --   TROPONINIHS 6 7  --   --   --      Estimated Creatinine Clearance: 62.9 mL/min (by C-G formula based on SCr of 0.81 mg/dL).   Medical History: Past Medical History:  Diagnosis Date   Arthritis    Coronary artery disease    Heart murmur    Hx of CABG    Hypertension    Hypothyroidism    hx of 25 years ago per pt   Rectocele    Uterine prolapse     Medications:  Infusions:   heparin 1,500 Units/hr (02/28/23 0414)    Assessment: 77 yof presented to the ED with SOB and CP. Found to have a PE and DVT and now on IV heparin.  She is not on anticoagulation PTA.   -heparin level at goal on 1500 units/hr -CBC stable  Goal of Therapy:  Heparin level 0.3-0.7 units/ml Monitor platelets by anticoagulation protocol: Yes   Plan:  -Continue heparin at 1500 units/hr -Confirm heparin level later today  Harland German, PharmD Clinical Pharmacist **Pharmacist phone directory can now be found on [email or web address] ([email or web address]).  Listed under [facility].  Addendum -Heparin level= 0.24  Plan -Increase heparin to 1700 units/hr -Heparin level and CBC in am  Harland German, PharmD Clinical Pharmacist **Pharmacist phone directory can now be found on [email or web address] ([email or web address]).  Listed under [facility].

## 2023-02-28 NOTE — Progress Notes (Signed)
ANTICOAGULATION CONSULT NOTE - Follow Up Consult  Pharmacy Consult for heparin Indication:  PE/DVT  Labs: Recent Labs    02/27/23 1426 02/27/23 1612 02/28/23 0018  HGB 9.8*  --   --   HCT 29.8*  --   --   PLT 269  --   --   LABPROT 14.7  --   --   INR 1.2  --   --   HEPARINUNFRC  --   --  0.17*  CREATININE 0.88  --   --   TROPONINIHS 6 7  --     Assessment: 77yo female subtherapeutic on heparin with initial dosing for PE, now DVT also confirmed; no infusion issues or signs of bleeding per RN.  Goal of Therapy:  Heparin level 0.3-0.7 units/ml   Plan:  Will rebolus with heparin 3000 units, increase heparin infusion by 3 units/kg/hr to 1500 units/hr and check level in 6-8 hours.    Vernard Gambles, PharmD, BCPS  02/28/2023,1:40 AM

## 2023-02-28 NOTE — Progress Notes (Signed)
  Echocardiogram 2D Echocardiogram attempted 1111. Pt on bedside commode. Will attempt again later.  Alicia Herrera 02/28/2023, 11:12 AM

## 2023-02-28 NOTE — Consult Note (Signed)
NAME:  Alicia Herrera, MRN:  [id number], DOB:  1946-11-01, LOS: 1 ADMISSION DATE:  02/27/2023, CONSULTATION DATE:  02/28/23 REFERRING MD:  Blake Divine CHIEF COMPLAINT:  Chest tightness   History of Present Illness:  Alicia Herrera is a 77 y.o. female who has a PMH as below. She had R TKA on 02/19/23 and tolerated this quite well. She was doing well until 4/14 when she noticed a little discomfort in the left chest/breast area. She thought her bra was too tight so asked daughter to help loosen it. This persisted into the next day when she had very slight dysnpea and pleuritic pain which prompted her to call her doctor.  She was told to go to ED for CTA chest. 4/16, she went to to [facility] and had CTA chest that demonstrated extensive bilateral PE with RV/LV 1.4 and trace effusions. LE duplex showed almost occlusive thrombus in the right greater saphenous vein and occlusive thrombus at the right peroneal and soleal vein.  She was admitted by [facility] and started on heparin gtt. Echo was ordered and performed PM 4/17. PCCM asked to see for any further recs.  Pertinent  Medical History:  has Uterine prolapse; S/P CABG (coronary artery bypass graft); S/P ascending aortic aneurysm repair; Rectocele; Postmenopausal bleeding; OAB (overactive bladder); Midline cystocele; Hyperlipidemia; Essential hypertension; Primary osteoarthritis of both knees; Cyst of skin; Primary osteoarthritis of right knee; Status post total knee replacement, right; and Pulmonary embolism with acute cor pulmonale on their problem list.  Significant Hospital Events: Including procedures, antibiotic start and stop dates in addition to other pertinent events   4/8 R TKA by ortho 4/16 admitted with PE 4/17 PCCM consult  Interim History / Subjective:  Comfortable on 2L O2. No dyspnea currently, no chest pain. About to eat her lunch.  Objective:  Blood pressure 91/76, pulse 81, temperature 98.5 F (36.9 C), temperature source Oral, resp. rate 18,  height  (1.702 m), weight 81.6 kg, SpO2 94 %.        Intake/Output Summary (Last 24 hours) at 02/28/2023 1253 Last data filed at 02/28/2023 0352 Gross per 24 hour  Intake 583.6 ml  Output --  Net 583.6 ml   Filed Weights   02/27/23 1325 02/27/23 2015  Weight: 81.6 kg 81.6 kg    Examination: General: Adult female, resting in bed, in NAD. Neuro: A&O x 3, no deficits. HEENT: [place]/AT. Sclerae anicteric. EOMI. Cardiovascular: RRR, no M/R/G.  Lungs: Respirations even and unlabored.  CTA bilaterally, No W/R/R. Abdomen: BS x 4, soft, NT/ND.  Musculoskeletal: R TKA incision clean. Skin: Intact, warm, no rashes.  Labs/imaging personally reviewed:  CTA chest 4/16 > extensive bilateral PE with RV/LV 1.4 and trace effusions. LE duplex 4/16 > almost occlusive thrombus in the right greater saphenous vein and occlusive thrombus at the right peroneal and soleal vein. Echo 4/17 >   Assessment & Plan:   Bilateral PE - provoked after R TKA surgery 02/19/23. Echo reviewed by Dr. Denese Killings and as expected based on how she looks clinically, no significant findings of RV dysfunction etc. Acute hypoxic respiratory failure - 2/2 above. - Transition from heparin IV to DOAC, minimum 3 months. - Might need short term home O2. - Ok to discharge and have her follow up with PCP in the next week or can see pulmonary as outpatient if she desires.  Rest per primary team. Nothing further to add.  PCCM will sign off.  Please call us back if we can be of any further  assistance.   Best practice (evaluated daily):  Per primary.  Labs   CBC: Recent Labs  Lab 02/27/23 1426 02/28/23 0200  WBC 12.4* 10.8*  NEUTROABS 9.8*  --   HGB 9.8* 9.5*  HCT 29.8* 28.3*  MCV 83.7 82.7  PLT 269 282    Basic Metabolic Panel: Recent Labs  Lab 02/27/23 1426 02/28/23 0200  NA 132* 135  K 3.1* 3.3*  CL 95* 101  CO2 26 24  GLUCOSE 134* 113*  BUN 16 13  CREATININE 0.88 0.81  CALCIUM 8.5* 8.3*   GFR: Estimated  Creatinine Clearance: 62.9 mL/min (by C-G formula based on SCr of 0.81 mg/dL). Recent Labs  Lab 02/27/23 1426 02/28/23 0200  WBC 12.4* 10.8*    Liver Function Tests: Recent Labs  Lab 02/27/23 1426  AST 20  ALT 18  ALKPHOS 69  BILITOT 0.8  PROT 6.7  ALBUMIN 3.2*   No results for input(s): "LIPASE", "AMYLASE" in the last 168 hours. No results for input(s): "AMMONIA" in the last 168 hours.  ABG No results found for: "PHART", "PCO2ART", "PO2ART", "HCO3", "TCO2", "ACIDBASEDEF", "O2SAT"   Coagulation Profile: Recent Labs  Lab 02/27/23 1426  INR 1.2    Cardiac Enzymes: No results for input(s): "CKTOTAL", "CKMB", "CKMBINDEX", "TROPONINI" in the last 168 hours.  HbA1C: No results found for: "HGBA1C"  CBG: No results for input(s): "GLUCAP" in the last 168 hours.  Review of Systems:   All negative; except for those that are bolded, which indicate positives.  Constitutional: weight loss, weight gain, night sweats, fevers, chills, fatigue, weakness.  HEENT: headaches, sore throat, sneezing, nasal congestion, post nasal drip, difficulty swallowing, tooth/dental problems, visual complaints, visual changes, ear aches. Neuro: difficulty with speech, weakness, numbness, ataxia. CV:  chest tightness, orthopnea, PND, swelling in lower extremities, dizziness, palpitations, syncope.  Resp: cough, hemoptysis, dyspnea, wheezing. GI: heartburn, indigestion, abdominal pain, nausea, vomiting, diarrhea, constipation, change in bowel habits, loss of appetite, hematemesis, melena, hematochezia.  GU: dysuria, change in color of urine, urgency or frequency, flank pain, hematuria. MSK: joint pain or swelling, decreased range of motion. Psych: change in mood or affect, depression, anxiety, suicidal ideations, homicidal ideations. Skin: rash, itching, bruising.   Past Medical History:  She,  has a past medical history of Arthritis, Coronary artery disease, Heart murmur, CABG, Hypertension,  Hypothyroidism, Rectocele, and Uterine prolapse.   Surgical History:   Past Surgical History:  Procedure Laterality Date   CARDIAC VALVE REPLACEMENT     AVR   CORONARY ARTERY BYPASS GRAFT     prolapsed uterus surgery      TOTAL KNEE ARTHROPLASTY Right 02/19/2023   Procedure: TOTAL KNEE ARTHROPLASTY;  Surgeon: Jodi Geralds, MD;  Location: [facility];  Service: Orthopedics;  Laterality: Right;     Social History:   reports that she has never smoked. She has never used smokeless tobacco. She reports current alcohol use of about 1.0 standard drink of alcohol per week. She reports that she does not use drugs.   Family History:  Her family history includes Cancer in her father; Diabetes in her sister; Heart attack in her mother; Hypertension in her mother; Penile cancer in her father.   Allergies No Known Allergies   Home Medications  Prior to Admission medications   Medication Sig Start Date End Date Taking? Authorizing Provider  amLODipine (NORVASC) 5 MG tablet Take 1 tablet by mouth once daily 02/08/23   [name], DO  aspirin EC 325 MG tablet Take 1 tablet (325 mg total)  by mouth 2 (two) times daily after a meal. 02/19/23 02/19/24  Shanon Payor, PA-C  celecoxib (CELEBREX) 200 MG capsule Take 1 capsule (200 mg total) by mouth 2 (two) times daily. 02/19/23 02/19/24  Shanon Payor, PA-C  docusate sodium (COLACE) 100 MG capsule Take 1 capsule (100 mg total) by mouth 2 (two) times daily as needed (for constipation). 02/19/23 02/19/24  Shanon Payor, PA-C  Evolocumab (REPATHA SURECLICK) 140 MG/ML SOAJ Inject 140 mg into the skin every 14 (fourteen) days. 01/30/23   [provider]  hydrochlorothiazide (HYDRODIURIL) 12.5 MG tablet Take 1 tablet by mouth once daily 02/23/23   [name], DO  oxyCODONE (ROXICODONE) 5 MG immediate release tablet Take 1-2 tablets (5-10 mg total) by mouth every 4 (four) hours as needed for severe pain. 02/19/23   Shanon Payor, PA-C      Rutherford Guys, PA  - [facility] For pager details, please see AMION or use Epic chat  After [phone], please call [facility] for cross coverage needs 02/28/2023, 12:53 PM

## 2023-02-28 NOTE — Progress Notes (Signed)
  Echocardiogram 2D Echocardiogram has been performed.  Alicia Herrera 02/28/2023, 12:55 PM

## 2023-02-28 NOTE — Progress Notes (Signed)
[facility]                                                                               [name], is a 77 y.o. female, DOB - [birth year], ZOX:[id number] Admit date - 02/27/2023    Outpatient Primary MD for the patient is [name], DO  LOS - 1  days    Brief summary   Alicia Herrera is a 77 y.o. female with medical history significant of CABG, HTN RECENTLY underwent R TKA performed x8 days ago presents with  SOB and CP.  Pain worse with deep breaths, pleuritic.   CT confirmed PE with RHS.  Pt started on heparin and admitted.  pCCM consulted for evaluation catheter thrombolysis. Recommended management with anti coagulation.    Assessment & Plan    Assessment and Plan: * Pulmonary embolism with acute cor pulmonale Echocardiogram ordered and pending.  Currently requiring about 2 lit of [place] oxygen to keep sats greater than 90%.  Started her on IV heparin, possible transition to eliquis on discharge.  PCCM consulted for evaluation of catheter directed therapy, recommended management with anti coagulation for 3 months.  Venous duplex shows Almost occlusive thrombus of the right greater saphenous vein at the saphenofemoral junction down to the mid thigh. Occlusive thrombus of the right peroneal and soleal vein within the calf. No evidence of deep venous thrombosis of the left lower extremity  Essential hypertension BP borderline low normal.  Continue to hold meds for today.   Hyperlipidemia Cont repatha  Right TKA Continue with PT and normal activities on discharge.  Pain control.      Estimated body mass index is 28.19 kg/m as calculated from the following:   Height as of this encounter: 5\' 7"  (1.702 m).   Weight as of this encounter: 81.6 kg.  Code Status: full code.  DVT Prophylaxis:  heparin.    Level of Care: Level of care: Progressive Family Communication: none at bedside.   Disposition Plan:     Remains inpatient appropriate:  IV  heparin.   Procedures:  Echocardiogram.   Consultants:   PCCM   Antimicrobials:   Anti-infectives (From admission, onward)    None        Medications  Scheduled Meds: Continuous Infusions:  heparin 1,500 Units/hr (02/28/23 0414)   PRN Meds:.acetaminophen **OR** acetaminophen, albuterol, ondansetron **OR** ondansetron (ZOFRAN) IV, oxyCODONE-acetaminophen    Subjective:   Alicia Herrera was seen and examined today. Pain and breathing improving.  Still on 2 lit of [place] oxygen.   Objective:   Vitals:   02/27/23 2015 02/28/23 0015 02/28/23 0410 02/28/23 1137  BP: (!) 135/59 (!) 117/54 (!) 120/54 91/76  Pulse: 88 79 72 81  Resp: 20 19 18 18   Temp: 99.2 F (37.3 C) 98.3 F (36.8 C) 98.3 F (36.8 C) 98.5 F (36.9 C)  TempSrc: Oral Oral Oral Oral  SpO2: 95% 93% 92% 94%  Weight: 81.6 kg     Height:        Intake/Output Summary (Last 24 hours) at 02/28/2023 1147 Last data filed at 02/28/2023 0352 Gross per 24 hour  Intake 583.6 ml  Output --  Net 583.6 ml  Filed Weights   02/27/23 1325 02/27/23 2015  Weight: 81.6 kg 81.6 kg     Exam General: Alert and oriented x 3, NAD Cardiovascular: S1 S2 auscultated, no murmurs, RRR Respiratory: Clear to auscultation bilaterally, no wheezing, rales or rhonchi Gastrointestinal: Soft, nontender, nondistended, + bowel sounds Ext: no pedal edema bilaterally Neuro: AAOx3, Cr N's II- XII. Strength 5/5 upper and lower extremities bilaterally Skin: No rashes Psych: Normal affect and demeanor, alert and oriented x3    Data Reviewed:  I have personally reviewed following labs and imaging studies   CBC Lab Results  Component Value Date   WBC 10.8 (H) 02/28/2023   RBC 3.42 (L) 02/28/2023   HGB 9.5 (L) 02/28/2023   HCT 28.3 (L) 02/28/2023   MCV 82.7 02/28/2023   MCH 27.8 02/28/2023   PLT 282 02/28/2023   MCHC 33.6 02/28/2023   RDW 14.7 02/28/2023   LYMPHSABS 1.2 02/27/2023   MONOABS 1.1 (H) 02/27/2023   EOSABS 0.3  02/27/2023   BASOSABS 0.0 02/27/2023     Last metabolic panel Lab Results  Component Value Date   NA 135 02/28/2023   K 3.3 (L) 02/28/2023   CL 101 02/28/2023   CO2 24 02/28/2023   BUN 13 02/28/2023   CREATININE 0.81 02/28/2023   GLUCOSE 113 (H) 02/28/2023   GFRNONAA >60 02/28/2023   GFRAA 95 05/14/2020   CALCIUM 8.3 (L) 02/28/2023   PROT 6.7 02/27/2023   ALBUMIN 3.2 (L) 02/27/2023   BILITOT 0.8 02/27/2023   ALKPHOS 69 02/27/2023   AST 20 02/27/2023   ALT 18 02/27/2023   ANIONGAP 10 02/28/2023    CBG (last 3)  No results for input(s): "GLUCAP" in the last 72 hours.    Coagulation Profile: Recent Labs  Lab 02/27/23 1426  INR 1.2     Radiology Studies: US Venous Img Lower Bilateral (DVT)  Addendum Date: 02/27/2023   ADDENDUM REPORT: 02/27/2023 17:40 ADDENDUM: These results were called by telephone at the time of interpretation on 02/27/2023 at 5:40 pm to provider Dr. Tanda Rockers, who verbally acknowledged these results. Electronically Signed   By: Tish Frederickson M.D.   On: 02/27/2023 17:40   Result Date: 02/27/2023 CLINICAL DATA:  141700 Pulmonary embolism 141700 EXAM: BILATERAL LOWER EXTREMITY VENOUS DOPPLER ULTRASOUND TECHNIQUE: Gray-scale sonography with graded compression, as well as color Doppler and duplex ultrasound were performed to evaluate the lower extremity deep venous systems from the level of the common femoral vein and including the common femoral, femoral, profunda femoral, popliteal and calf veins including the posterior tibial, peroneal and gastrocnemius veins when visible. The superficial great saphenous vein was also interrogated. Spectral Doppler was utilized to evaluate flow at rest and with distal augmentation maneuvers in the common femoral, femoral and popliteal veins. COMPARISON:  None Available. FINDINGS: RIGHT LOWER EXTREMITY Common Femoral Vein: No evidence of thrombus. Normal compressibility, respiratory phasicity and response to augmentation.  Saphenofemoral Junction: Almost occlusive thrombus of the right greater saphenous vein at the saphenofemoral junction down the mid thigh. Profunda Femoral Vein: No evidence of thrombus. Normal compressibility and flow on color Doppler imaging. Femoral Vein: No evidence of thrombus. Normal compressibility, respiratory phasicity and response to augmentation. Popliteal Vein: No evidence of thrombus. Normal compressibility, respiratory phasicity and response to augmentation. Calf Veins: Occlusive thrombus of the peroneal vein. Occlusive thrombus of the Soleal vein. Superficial Great Saphenous Vein: No evidence of thrombus. Normal compressibility. Venous Reflux:  None. Other Findings:  None. LEFT LOWER EXTREMITY Common Femoral Vein: No evidence  of thrombus. Normal compressibility, respiratory phasicity and response to augmentation. Saphenofemoral Junction: No evidence of thrombus. Normal compressibility and flow on color Doppler imaging. Profunda Femoral Vein: No evidence of thrombus. Normal compressibility and flow on color Doppler imaging. Femoral Vein: No evidence of thrombus. Normal compressibility, respiratory phasicity and response to augmentation. Popliteal Vein: No evidence of thrombus. Normal compressibility, respiratory phasicity and response to augmentation. Calf Veins: No evidence of thrombus. Normal compressibility and flow on color Doppler imaging. Superficial Great Saphenous Vein: No evidence of thrombus. Normal compressibility. Venous Reflux:  None. Other Findings:  None. IMPRESSION: 1. Almost occlusive thrombus of the right greater saphenous vein at the saphenofemoral junction down to the mid thigh. 2. Occlusive thrombus of the right peroneal and soleal vein within the calf. 3. No evidence of deep venous thrombosis of the left lower extremity. Electronically Signed: By: Tish Frederickson M.D. On: 02/27/2023 17:36   CT Angio Chest PE W/Cm &/Or Wo Cm  Result Date: 02/27/2023 CLINICAL DATA:  Right-sided  chest pain and shortness of breath. History of total knee replacement last week. EXAM: CT ANGIOGRAPHY CHEST WITH CONTRAST TECHNIQUE: Multidetector CT imaging of the chest was performed using the standard protocol during bolus administration of intravenous contrast. Multiplanar CT image reconstructions and MIPs were obtained to evaluate the vascular anatomy. RADIATION DOSE REDUCTION: This exam was performed according to the departmental dose-optimization program which includes automated exposure control, adjustment of the mA and/or kV according to patient size and/or use of iterative reconstruction technique. CONTRAST:  OMNIPAQUE IOHEXOL 350 MG/ML SOLN COMPARISON:  Chest x-ray from same day. FINDINGS: Cardiovascular: Satisfactory opacification of the pulmonary arteries to the segmental level. Extensive bilateral lobar and segmental pulmonary emboli. Elevated RV/LV ratio of 1.4. Mild cardiomegaly status post CABG. No pericardial effusion. No thoracic aortic aneurysm or dissection. Coronary, aortic arch, and branch vessel atherosclerotic vascular disease. Mediastinum/Nodes: No enlarged mediastinal, hilar, or axillary lymph nodes. Thyroid gland, trachea, and esophagus demonstrate no significant findings. Lungs/Pleura: Trace bilateral pleural effusions. Mild subsegmental atelectasis at both lung bases. No focal consolidation, pleural effusion, or pneumothorax. Two 3 mm nodules in the right middle lobe. No follow-up imaging is recommended. Upper Abdomen: No acute abnormality. Musculoskeletal: No chest wall abnormality. No acute or significant osseous findings. Review of the MIP images confirms the above findings. IMPRESSION: 1. Extensive bilateral lobar and segmental pulmonary emboli with CT evidence of right heart strain (RV/LV Ratio = 1.4 ) consistent with at least submassive (intermediate risk) PE. The presence of right heart strain has been associated with an increased risk of morbidity and mortality. Please  refer to the "Code PE Focused" order set in EPIC. 2. Trace bilateral pleural effusions. Critical Value/emergent results were called by telephone at the time of interpretation on 02/27/2023 at 3:43 pm to provider Alvino Blood , who verbally acknowledged these results. Electronically Signed   By: Obie Dredge M.D.   On: 02/27/2023 15:44   DG Chest 2 View  Result Date: 02/27/2023 CLINICAL DATA:  Shortness of breath EXAM: CHEST - 2 VIEW COMPARISON:  CXR 02/12/23 FINDINGS: Status post median sternotomy and CABG. Unchanged cardiac and mediastinal contours. Unchanged asymmetric elevation of the left hemidiaphragm. No pleural effusion. No pneumothorax. Hazy bibasilar airspace opacities, favored to represent atelectasis. No radiographically apparent displaced rib fractures. Vertebral body heights are maintained IMPRESSION: Bibasilar atelectasis. Electronically Signed   By: Lorenza Cambridge M.D.   On: 02/27/2023 14:26       Kathlen Mody M.D. [facility] 02/28/2023, 11:47 AM  Available via Epic secure chat 7am-7pm After 7 pm, please refer to night coverage provider listed on amion.

## 2023-03-01 ENCOUNTER — Other Ambulatory Visit (HOSPITAL_COMMUNITY): Payer: Self-pay

## 2023-03-01 DIAGNOSIS — E785 Hyperlipidemia, unspecified: Secondary | ICD-10-CM | POA: Diagnosis not present

## 2023-03-01 DIAGNOSIS — I2609 Other pulmonary embolism with acute cor pulmonale: Secondary | ICD-10-CM | POA: Diagnosis not present

## 2023-03-01 DIAGNOSIS — E876 Hypokalemia: Secondary | ICD-10-CM

## 2023-03-01 DIAGNOSIS — I10 Essential (primary) hypertension: Secondary | ICD-10-CM | POA: Diagnosis not present

## 2023-03-01 LAB — CBC
HCT: 28.7 % — ABNORMAL LOW (ref 36.0–46.0)
Hemoglobin: 9.6 g/dL — ABNORMAL LOW (ref 12.0–15.0)
MCH: 27.8 pg (ref 26.0–34.0)
MCHC: 33.4 g/dL (ref 30.0–36.0)
MCV: 83.2 fL (ref 80.0–100.0)
Platelets: 284 10*3/uL (ref 150–400)
RBC: 3.45 MIL/uL — ABNORMAL LOW (ref 3.87–5.11)
RDW: 14.7 % (ref 11.5–15.5)
WBC: 10.7 10*3/uL — ABNORMAL HIGH (ref 4.0–10.5)
nRBC: 0.2 % (ref 0.0–0.2)

## 2023-03-01 LAB — HEPARIN LEVEL (UNFRACTIONATED): Heparin Unfractionated: 0.33 IU/mL (ref 0.30–0.70)

## 2023-03-01 MED ORDER — OXYCODONE HCL 5 MG PO TABS: 5.0000 mg | ORAL_TABLET | ORAL | Status: DC | PRN

## 2023-03-01 MED ORDER — ASPIRIN 81 MG PO TBEC
81.0000 mg | DELAYED_RELEASE_TABLET | Freq: Every day | ORAL | 2 refills | Status: AC
  Filled 2023-03-01: qty 30, 30d supply, fill #0

## 2023-03-01 MED ORDER — TIZANIDINE HCL 4 MG PO TABS
4.0000 mg | ORAL_TABLET | Freq: Three times a day (TID) | ORAL | Status: DC | PRN
  Filled 2023-03-01: qty 1

## 2023-03-01 MED ORDER — APIXABAN (ELIQUIS) VTE STARTER PACK (10MG AND 5MG)
5.0000 mg | ORAL_TABLET | ORAL | 0 refills | Status: DC
  Filled 2023-03-01: qty 74, 30d supply, fill #0

## 2023-03-01 MED ORDER — APIXABAN 5 MG PO TABS
5.0000 mg | ORAL_TABLET | Freq: Two times a day (BID) | ORAL | 2 refills | Status: DC
  Filled 2023-03-01: qty 60, 30d supply, fill #0

## 2023-03-01 MED ORDER — DOCUSATE SODIUM 100 MG PO CAPS: 100.0000 mg | ORAL_CAPSULE | Freq: Two times a day (BID) | ORAL | Status: DC | PRN

## 2023-03-01 MED ORDER — APIXABAN 5 MG PO TABS: 5.0000 mg | ORAL_TABLET | Freq: Two times a day (BID) | ORAL | Status: DC

## 2023-03-01 MED ORDER — APIXABAN 5 MG PO TABS
10.0000 mg | ORAL_TABLET | Freq: Two times a day (BID) | ORAL | Status: DC
  Administered 2023-03-01: 10 mg via ORAL
  Filled 2023-03-01: qty 2

## 2023-03-01 NOTE — Progress Notes (Signed)
SATURATION QUALIFICATIONS: (This note is used to comply with regulatory documentation for home oxygen)  Patient Saturations on Room Air at Rest = 94%  Patient Saturations on Room Air while Ambulating = 89%  Patient Saturations on -- Liters of oxygen while Ambulating = --%  Please briefly explain why patient needs home oxygen: Pt maintained SPO2 89% and greater during gait on RA, does not qualify for home O2.  Marye Round, PT DPT Acute Rehabilitation Services Pager [phone]  Office [phone]

## 2023-03-01 NOTE — Discharge Summary (Signed)
Physician Discharge Summary   [name] MRN: [id number] DOB: Aug 04, 1946  Admit date:     02/27/2023  Discharge date: 03/01/23  Discharge Physician: Kathlen Mody   PCP: [name], DO   Recommendations at discharge:  Please follow up with PCp in one week.   Discharge Diagnoses: Principal Problem:   Pulmonary embolism with acute cor pulmonale Active Problems:   Hyperlipidemia   Essential hypertension   Acute hypoxic respiratory failure    Hospital Course: Alicia Herrera is a 77 y.o. female with medical history significant of CABG, HTN RECENTLY underwent R TKA performed x8 days ago presents with  SOB and CP.  Pain worse with deep breaths, pleuritic.   CT confirmed PE with RHS.  Pt started on heparin and admitted.  pCCM consulted for evaluation catheter thrombolysis. Recommended management with anti coagulation.     Assessment and Plan:    Pulmonary embolism with acute cor pulmonale Echocardiogram ordered and pending.  Currently requiring about 2 lit of  oxygen to keep sats greater than 90%.  Started her on IV heparin,  transitioned to eliquis on discharge.  PCCM consulted for evaluation of catheter directed therapy, recommended management with anti coagulation for 3 months.  Venous duplex shows Almost occlusive thrombus of the right greater saphenous vein at the saphenofemoral junction down to the mid thigh. Occlusive thrombus of the right peroneal and soleal vein within the calf. No evidence of deep venous thrombosis of the left lower extremity   Essential hypertension Well controlled. Resume home meds on discharge.    Hyperlipidemia Cont repatha   Right TKA Continue with PT and normal activities on discharge.  Pain control.        Consultants: PCCM Procedures performed: echo  Disposition: Home Diet recommendation:  Discharge Diet Orders (From admission, onward)     Start     Ordered   03/01/23 0000  Diet - low sodium heart healthy         03/01/23 1248           Regular diet DISCHARGE MEDICATION: Allergies as of 03/01/2023   No Known Allergies      Medication List     STOP taking these medications    celecoxib 200 MG capsule Commonly known as: CeleBREX       TAKE these medications    amLODipine 5 MG tablet Commonly known as: NORVASC Take 1 tablet by mouth once daily   Aspirin Low Dose 81 MG tablet Generic drug: aspirin EC Take 1 tablet (81 mg total) by mouth daily. Swallow whole. Start taking on: March 08, 2023 What changed:  medication strength how much to take when to take this additional instructions These instructions start on March 08, 2023. If you are unsure what to do until then, ask your doctor or other care provider.   docusate sodium 100 MG capsule Commonly known as: Colace Take 1 capsule (100 mg total) by mouth 2 (two) times daily as needed (for constipation).   Eliquis DVT/PE Starter Pack Generic drug: Apixaban Starter Pack (10mg  and 5mg ) Take 2 tablets (10mg ) by mouth twice daily for 7 days then 1 tablet (5mg ) twice daily thereafter   apixaban 5 MG Tabs tablet Commonly known as: ELIQUIS Take 1 tablet (5 mg total) by mouth 2 (two) times daily. Start taking on: March 08, 2023   hydrochlorothiazide 12.5 MG tablet Commonly known as: HYDRODIURIL Take 1 tablet by mouth once daily   oxyCODONE 5 MG immediate release tablet Commonly known as: Roxicodone  Take 1-2 tablets (5-10 mg total) by mouth every 4 (four) hours as needed for severe pain. What changed: when to take this   Repatha SureClick 140 MG/ML Soaj Generic drug: Evolocumab Inject 140 mg into the skin every 14 (fourteen) days.   tiZANidine 4 MG tablet Commonly known as: ZANAFLEX Take 4 mg by mouth every 8 (eight) hours as needed for muscle spasms.        Follow-up Information     [facility] Follow up.   Specialty: Home Health Services Why: Home Health Physical Therapy-office to call within 24-48  hours post transition home Contact information: [address] [place] [address] [phone]                Discharge Exam: Ceasar Mons Weights   02/27/23 1325 02/27/23 2015  Weight: 81.6 kg 81.6 kg   General exam: Appears calm and comfortable  Respiratory system: Clear to auscultation. Respiratory effort normal. Cardiovascular system: S1 & S2 heard, RRR. No JVD,  Gastrointestinal system: Abdomen is nondistended, soft and nontender.  Central nervous system: Alert and oriented. No focal neurological deficits. Extremities: Symmetric 5 x 5 power. Skin: No rashes,  Psychiatry:  Mood & affect appropriate.    Condition at discharge: fair  The results of significant diagnostics from this hospitalization (including imaging, microbiology, ancillary and laboratory) are listed below for reference.   Imaging Studies: ECHOCARDIOGRAM COMPLETE  Result Date: 02/28/2023    ECHOCARDIOGRAM REPORT   Patient Name:   Alicia Herrera Date of Exam: 02/28/2023 Medical Rec #:  [id number]      Height:       67.0 in Accession #:    [id number]     Weight:       180.0 lb Date of Birth:  22-Jul-1946       BSA:          1.934 m Patient Age:    77 years       BP:           91/76 mmHg Patient Gender: F              HR:           74 bpm. Exam Location:  Inpatient Procedure: 2D Echo, Cardiac Doppler and Color Doppler Indications:    Pulmonary embolus  History:        Patient has prior history of Echocardiogram examinations, most                 recent 05/27/2021. CAD, Prior CABG and Prior Cardiac Surgery,                 Signs/Symptoms:Murmur; Risk Factors:Hypertension. S/p AA repair.                 Aortic Valve: bioprosthetic valve is present in the aortic                 position. Procedure Date: 10/02/2013.  Sonographer:    Milda Smart Referring Phys: [phone] JARED M GARDNER  Sonographer Comments: Technically difficult study due to poor echo windows. Image acquisition challenging due to patient body habitus and  Image acquisition challenging due to respiratory motion. IMPRESSIONS  1. Left ventricular ejection fraction, by estimation, is 60 to 65%. The left ventricle has normal function. The left ventricle demonstrates regional wall motion abnormalities (see scoring diagram/findings for description). Left ventricular diastolic parameters are consistent with Grade I diastolic dysfunction (impaired relaxation).  2. Right ventricular systolic function is  mildly reduced. The right ventricular size is normal. There is normal pulmonary artery systolic pressure. The estimated right ventricular systolic pressure is 27.8 mmHg.  3. The mitral valve is normal in structure. No evidence of mitral valve regurgitation. No evidence of mitral stenosis.  4. The aortic valve has been repaired/replaced. Aortic valve regurgitation is not visualized. No aortic stenosis is present. There is a bioprosthetic valve present in the aortic position. Procedure Date: 10/02/2013. Aortic valve mean gradient measures 17.0 mmHg. Aortic valve Vmax measures 2.68 m/s.  5. The inferior vena cava is normal in size with greater than 50% respiratory variability, suggesting right atrial pressure of 3 mmHg. Comparison(s): Previous echo report from [facility] described mean AVR gradient was 13 mm Hg. FINDINGS  Left Ventricle: Left ventricular ejection fraction, by estimation, is 60 to 65%. The left ventricle has normal function. The left ventricle demonstrates regional wall motion abnormalities. The left ventricular internal cavity size was normal in size. There is no left ventricular hypertrophy. Abnormal (paradoxical) septal motion consistent with post-operative status. Left ventricular diastolic parameters are consistent with Grade I diastolic dysfunction (impaired relaxation). Normal left ventricular filling pressure.  LV Wall Scoring: The apical septal segment is hypokinetic. Right Ventricle: The right ventricular size is normal. No increase in right ventricular wall  thickness. Right ventricular systolic function is mildly reduced. There is normal pulmonary artery systolic pressure. The tricuspid regurgitant velocity is 2.49 m/s, and with an assumed right atrial pressure of 3 mmHg, the estimated right ventricular systolic pressure is 27.8 mmHg. Left Atrium: Left atrial size was normal in size. Right Atrium: Right atrial size was normal in size. Pericardium: There is no evidence of pericardial effusion. Mitral Valve: The mitral valve is normal in structure. No evidence of mitral valve regurgitation. No evidence of mitral valve stenosis. MV peak gradient, 3.6 mmHg. The mean mitral valve gradient is 2.0 mmHg. Tricuspid Valve: The tricuspid valve is normal in structure. Tricuspid valve regurgitation is mild . No evidence of tricuspid stenosis. Aortic Valve: The aortic valve has been repaired/replaced. Aortic valve regurgitation is not visualized. No aortic stenosis is present. Aortic valve mean gradient measures 17.0 mmHg. Aortic valve peak gradient measures 28.7 mmHg. Aortic valve area, by VTI measures 1.30 cm. There is a bioprosthetic valve present in the aortic position. Procedure Date: 10/02/2013. Pulmonic Valve: The pulmonic valve was normal in structure. Pulmonic valve regurgitation is not visualized. No evidence of pulmonic stenosis. Aorta: The aortic root is normal in size and structure. Venous: The inferior vena cava is normal in size with greater than 50% respiratory variability, suggesting right atrial pressure of 3 mmHg. IAS/Shunts: No atrial level shunt detected by color flow Doppler.  LEFT VENTRICLE PLAX 2D LVIDd:         3.80 cm   Diastology LVIDs:         2.70 cm   LV e' medial:    7.07 cm/s LV PW:         1.50 cm   LV E/e' medial:  9.5 LV IVS:        1.60 cm   LV e' lateral:   7.83 cm/s LVOT diam:     1.60 cm   LV E/e' lateral: 8.6 LV SV:         72 LV SV Index:   37 LVOT Area:     2.01 cm  RIGHT VENTRICLE            IVC RV S prime:     9.36 cm/s  IVC diam: 2.00  cm TAPSE (M-mode): 1.1 cm LEFT ATRIUM             Index        RIGHT ATRIUM           Index LA diam:        3.60 cm 1.86 cm/m   RA Area:     14.00 cm LA Vol (A2C):   49.6 ml 25.65 ml/m  RA Volume:   36.00 ml  18.62 ml/m LA Vol (A4C):   40.0 ml 20.69 ml/m LA Biplane Vol: 44.9 ml 23.22 ml/m  AORTIC VALVE AV Area (Vmax):    1.19 cm AV Area (Vmean):   1.23 cm AV Area (VTI):     1.30 cm AV Vmax:           268.00 cm/s AV Vmean:          196.000 cm/s AV VTI:            0.554 m AV Peak Grad:      28.7 mmHg AV Mean Grad:      17.0 mmHg LVOT Vmax:         159.00 cm/s LVOT Vmean:        120.000 cm/s LVOT VTI:          0.357 m LVOT/AV VTI ratio: 0.64  AORTA Ao Root diam: 2.70 cm Ao Asc diam:  2.90 cm MITRAL VALVE               TRICUSPID VALVE MV Area (PHT): 2.13 cm    TR Peak grad:   24.8 mmHg MV Area VTI:   2.79 cm    TR Mean grad:   19.0 mmHg MV Peak grad:  3.6 mmHg    TR Vmax:        249.00 cm/s MV Mean grad:  2.0 mmHg    TR Vmean:       213.0 cm/s MV Vmax:       0.95 m/s MV Vmean:      63.7 cm/s   SHUNTS MV Decel Time: 356 msec    Systemic VTI:  0.36 m MR Peak grad: 11.4 mmHg    Systemic Diam: 1.60 cm MR Vmax:      169.00 cm/s MV E velocity: 67.40 cm/s MV A velocity: 82.10 cm/s MV E/A ratio:  0.82 Mihai Croitoru MD Electronically signed by Thurmon Fair MD Signature Date/Time: 02/28/2023/2:27:50 PM    Final    US Venous Img Lower Bilateral (DVT)  Addendum Date: 02/27/2023   ADDENDUM REPORT: 02/27/2023 17:40 ADDENDUM: These results were called by telephone at the time of interpretation on 02/27/2023 at 5:40 pm to provider Dr. Tanda Rockers, who verbally acknowledged these results. Electronically Signed   By: Tish Frederickson M.D.   On: 02/27/2023 17:40   Result Date: 02/27/2023 CLINICAL DATA:  141700 Pulmonary embolism 141700 EXAM: BILATERAL LOWER EXTREMITY VENOUS DOPPLER ULTRASOUND TECHNIQUE: Gray-scale sonography with graded compression, as well as color Doppler and duplex ultrasound were performed to evaluate  the lower extremity deep venous systems from the level of the common femoral vein and including the common femoral, femoral, profunda femoral, popliteal and calf veins including the posterior tibial, peroneal and gastrocnemius veins when visible. The superficial great saphenous vein was also interrogated. Spectral Doppler was utilized to evaluate flow at rest and with distal augmentation maneuvers in the common femoral, femoral and popliteal veins. COMPARISON:  None Available. FINDINGS: RIGHT LOWER EXTREMITY Common Femoral Vein: No evidence of thrombus.  Normal compressibility, respiratory phasicity and response to augmentation. Saphenofemoral Junction: Almost occlusive thrombus of the right greater saphenous vein at the saphenofemoral junction down the mid thigh. Profunda Femoral Vein: No evidence of thrombus. Normal compressibility and flow on color Doppler imaging. Femoral Vein: No evidence of thrombus. Normal compressibility, respiratory phasicity and response to augmentation. Popliteal Vein: No evidence of thrombus. Normal compressibility, respiratory phasicity and response to augmentation. Calf Veins: Occlusive thrombus of the peroneal vein. Occlusive thrombus of the Soleal vein. Superficial Great Saphenous Vein: No evidence of thrombus. Normal compressibility. Venous Reflux:  None. Other Findings:  None. LEFT LOWER EXTREMITY Common Femoral Vein: No evidence of thrombus. Normal compressibility, respiratory phasicity and response to augmentation. Saphenofemoral Junction: No evidence of thrombus. Normal compressibility and flow on color Doppler imaging. Profunda Femoral Vein: No evidence of thrombus. Normal compressibility and flow on color Doppler imaging. Femoral Vein: No evidence of thrombus. Normal compressibility, respiratory phasicity and response to augmentation. Popliteal Vein: No evidence of thrombus. Normal compressibility, respiratory phasicity and response to augmentation. Calf Veins: No evidence of  thrombus. Normal compressibility and flow on color Doppler imaging. Superficial Great Saphenous Vein: No evidence of thrombus. Normal compressibility. Venous Reflux:  None. Other Findings:  None. IMPRESSION: 1. Almost occlusive thrombus of the right greater saphenous vein at the saphenofemoral junction down to the mid thigh. 2. Occlusive thrombus of the right peroneal and soleal vein within the calf. 3. No evidence of deep venous thrombosis of the left lower extremity. Electronically Signed: By: Tish Frederickson M.D. On: 02/27/2023 17:36   CT Angio Chest PE W/Cm &/Or Wo Cm  Result Date: 02/27/2023 CLINICAL DATA:  Right-sided chest pain and shortness of breath. History of total knee replacement last week. EXAM: CT ANGIOGRAPHY CHEST WITH CONTRAST TECHNIQUE: Multidetector CT imaging of the chest was performed using the standard protocol during bolus administration of intravenous contrast. Multiplanar CT image reconstructions and MIPs were obtained to evaluate the vascular anatomy. RADIATION DOSE REDUCTION: This exam was performed according to the departmental dose-optimization program which includes automated exposure control, adjustment of the mA and/or kV according to patient size and/or use of iterative reconstruction technique. CONTRAST:  OMNIPAQUE IOHEXOL 350 MG/ML SOLN COMPARISON:  Chest x-ray from same day. FINDINGS: Cardiovascular: Satisfactory opacification of the pulmonary arteries to the segmental level. Extensive bilateral lobar and segmental pulmonary emboli. Elevated RV/LV ratio of 1.4. Mild cardiomegaly status post CABG. No pericardial effusion. No thoracic aortic aneurysm or dissection. Coronary, aortic arch, and branch vessel atherosclerotic vascular disease. Mediastinum/Nodes: No enlarged mediastinal, hilar, or axillary lymph nodes. Thyroid gland, trachea, and esophagus demonstrate no significant findings. Lungs/Pleura: Trace bilateral pleural effusions. Mild subsegmental atelectasis at both  lung bases. No focal consolidation, pleural effusion, or pneumothorax. Two 3 mm nodules in the right middle lobe. No follow-up imaging is recommended. Upper Abdomen: No acute abnormality. Musculoskeletal: No chest wall abnormality. No acute or significant osseous findings. Review of the MIP images confirms the above findings. IMPRESSION: 1. Extensive bilateral lobar and segmental pulmonary emboli with CT evidence of right heart strain (RV/LV Ratio = 1.4 ) consistent with at least submassive (intermediate risk) PE. The presence of right heart strain has been associated with an increased risk of morbidity and mortality. Please refer to the "Code PE Focused" order set in EPIC. 2. Trace bilateral pleural effusions. Critical Value/emergent results were called by telephone at the time of interpretation on 02/27/2023 at 3:43 pm to provider Alvino Blood , who verbally acknowledged these results. Electronically Signed  By: Obie Dredge M.D.   On: 02/27/2023 15:44   DG Chest 2 View  Result Date: 02/27/2023 CLINICAL DATA:  Shortness of breath EXAM: CHEST - 2 VIEW COMPARISON:  CXR 02/12/23 FINDINGS: Status post median sternotomy and CABG. Unchanged cardiac and mediastinal contours. Unchanged asymmetric elevation of the left hemidiaphragm. No pleural effusion. No pneumothorax. Hazy bibasilar airspace opacities, favored to represent atelectasis. No radiographically apparent displaced rib fractures. Vertebral body heights are maintained IMPRESSION: Bibasilar atelectasis. Electronically Signed   By: Lorenza Cambridge M.D.   On: 02/27/2023 14:26   DG Chest 2 View  Result Date: 02/13/2023 CLINICAL DATA:  Preop chest for right knee replacement. EXAM: CHEST - 2 VIEW COMPARISON:  None Available. FINDINGS: Cardiac silhouette is borderline enlarged. There are changes from previous cardiac surgery and aortic valve replacement. No mediastinal or hilar masses. No enlarged lymph nodes. Lungs essentially clear.  No pleural effusion or  pneumothorax. Skeletal structures are intact. IMPRESSION: No acute cardiopulmonary disease. Electronically Signed   By: Amie Portland M.D.   On: 02/13/2023 13:23    Microbiology: Results for orders placed or performed during the hospital encounter of 02/12/23  Surgical pcr screen     Status: None   Collection Time: 02/12/23  9:48 AM   Specimen: Nasal Mucosa; Nasal Swab  Result Value Ref Range Status   MRSA, PCR NEGATIVE NEGATIVE Final   Staphylococcus aureus NEGATIVE NEGATIVE Final    Comment: (NOTE) The Xpert SA Assay (FDA approved for NASAL specimens in patients 87 years of age and older), is one component of a comprehensive surveillance program. It is not intended to diagnose infection nor to guide or monitor treatment. Performed at [facility], [address] [place], [place] [address]     Labs: CBC: Recent Labs  Lab 02/27/23 1426 02/28/23 0200 03/01/23 0223  WBC 12.4* 10.8* 10.7*  NEUTROABS 9.8*  --   --   HGB 9.8* 9.5* 9.6*  HCT 29.8* 28.3* 28.7*  MCV 83.7 82.7 83.2  PLT 269 282 284   Basic Metabolic Panel: Recent Labs  Lab 02/27/23 1426 02/28/23 0200  NA 132* 135  K 3.1* 3.3*  CL 95* 101  CO2 26 24  GLUCOSE 134* 113*  BUN 16 13  CREATININE 0.88 0.81  CALCIUM 8.5* 8.3*   Liver Function Tests: Recent Labs  Lab 02/27/23 1426  AST 20  ALT 18  ALKPHOS 69  BILITOT 0.8  PROT 6.7  ALBUMIN 3.2*   CBG: No results for input(s): "GLUCAP" in the last 168 hours.  Discharge time spent: 41 minutes.   Signed: Kathlen Mody, MD [facility] 03/01/2023

## 2023-03-01 NOTE — Discharge Instructions (Signed)
Information on my medicine - ELIQUIS (apixaban)  Why was Eliquis prescribed for you? Eliquis was prescribed to treat blood clots that may have been found in the veins of your legs (deep vein thrombosis) or in your lungs (pulmonary embolism) and to reduce the risk of them occurring again.  What do You need to know about Eliquis ? The starting dose is 10 mg (two 5 mg tablets) taken TWICE daily for the FIRST SEVEN (7) DAYS, then on 03/08/2023  the dose is reduced to ONE 5 mg tablet taken TWICE daily.  Eliquis may be taken with or without food.   Try to take the dose about the same time in the morning and in the evening. If you have difficulty swallowing the tablet whole please discuss with your pharmacist how to take the medication safely.  Take Eliquis exactly as prescribed and DO NOT stop taking Eliquis without talking to the doctor who prescribed the medication.  Stopping may increase your risk of developing a new blood clot.  Refill your prescription before you run out.  After discharge, you should have regular check-up appointments with your healthcare provider that is prescribing your Eliquis.    What do you do if you miss a dose? If a dose of ELIQUIS is not taken at the scheduled time, take it as soon as possible on the same day and twice-daily administration should be resumed. The dose should not be doubled to make up for a missed dose.  Important Safety Information A possible side effect of Eliquis is bleeding. You should call your healthcare provider right away if you experience any of the following: Bleeding from an injury or your nose that does not stop. Unusual colored urine (red or dark brown) or unusual colored stools (red or black). Unusual bruising for unknown reasons. A serious fall or if you hit your head (even if there is no bleeding).  Some medicines may interact with Eliquis and might increase your risk of bleeding or clotting while on Eliquis. To help avoid  this, consult your healthcare provider or pharmacist prior to using any new prescription or non-prescription medications, including herbals, vitamins, non-steroidal anti-inflammatory drugs (NSAIDs) and supplements.  This website has more information on Eliquis (apixaban): http://www.eliquis.com/eliquis/home

## 2023-03-01 NOTE — Progress Notes (Signed)
ANTICOAGULATION CONSULT NOTE -   Pharmacy Consult for heparin> apixban Indication: pulmonary embolus  No Known Allergies  Patient Measurements: Height:  (170.2 cm) Weight: 81.6 kg (180 lb) IBW/kg (Calculated) : 61.6 Heparin Dosing Weight: 78.4kg  Vital Signs: Temp: 98.6 F (37 C) (04/18 0352) Temp Source: Oral (04/18 0352) BP: 127/50 (04/18 0352) Pulse Rate: 71 (04/18 0352)  Labs: Recent Labs    02/27/23 1426 02/27/23 1612 02/28/23 0018 02/28/23 0200 02/28/23 0831 02/28/23 1404 03/01/23 0223  HGB 9.8*  --   --  9.5*  --   --  9.6*  HCT 29.8*  --   --  28.3*  --   --  28.7*  PLT 269  --   --  282  --   --  284  LABPROT 14.7  --   --   --   --   --   --   INR 1.2  --   --   --   --   --   --   HEPARINUNFRC  --   --    < >  --  0.38 0.24* 0.33  CREATININE 0.88  --   --  0.81  --   --   --   TROPONINIHS 6 7  --   --   --   --   --    < > = values in this interval not displayed.     Estimated Creatinine Clearance: 62.9 mL/min (by C-G formula based on SCr of 0.81 mg/dL).   Medical History: Past Medical History:  Diagnosis Date   Arthritis    Coronary artery disease    Heart murmur    Hx of CABG    Hypertension    Hypothyroidism    hx of 25 years ago per pt   Rectocele    Uterine prolapse     Medications:  Infusions:     Assessment: 61 yof presented to the ED with SOB and CP. Found to have a PE and DVT and now on IV heparin.  She is not on anticoagulation PTA.   -heparin level at goal on 1500 units/hr -CBC stable -plans to transition to apixaban today (cost= $45/month)  Goal of Therapy:  Heparin level 0.3-0.7 units/ml Monitor platelets by anticoagulation protocol: Yes   Plan:  -Apixaban  po bid for 7 days then  po bid  Harland German, PharmD Clinical Pharmacist **Pharmacist phone directory can now be found on [email or web address] ([email or web address]).  Listed under [facility].

## 2023-03-01 NOTE — TOC Initial Note (Addendum)
Transition of Care [facility]) - Initial/Assessment Note    Patient Details  Name: Alicia Herrera MRN: [id number] Date of Birth: 1945-12-20  Transition of Care [facility]) CM/SW Contact:    Gala Lewandowsky, RN Phone Number: 03/01/2023, 10:47 AM  Clinical Narrative:  Patient discussed in progression rounds this morning. Patient presented for shortness of breath-plan for home on Eliquis for confirmed PE-benefits check completed $45.00. PTA patient was active with [facility] for Physical Therapy. Patient will need resumption orders-MD is aware. Patient has insurance and PCP. Case Manager will continue to follow for transition of care needs as the patient progresses.                   [phone] 03-01-23 Patient reports that she has an appointment with [facility] tomorrow for home health and then she will have outpatient physical therapy on Monday.   Expected Discharge Plan: Home w Home Health Services Barriers to Discharge: No Barriers Identified   Patient Goals and CMS Choice Patient states their goals for this hospitalization and ongoing recovery are:: to return home with home health services.   Expected Discharge Plan and Services   Discharge Planning Services: CM Consult     HH Arranged: PT HH Agency: [facility] Date [place] Agency Contacted: 03/01/23 Time HH Agency Contacted: 1047 Representative spoke with at [facility] Agency: Tresa Endo  Prior Living Arrangements/Services     Patient language and need for interpreter reviewed:: Yes Do you feel safe going back to the place where you live?: Yes      Need for Family Participation in Patient Care: Yes (Comment) Care giver support system in place?: Yes (comment)   Criminal Activity/Legal Involvement Pertinent to Current Situation/Hospitalization: No - Comment as needed  Activities of Daily Living Home Assistive Devices/Equipment: None ADL Screening (condition at time of admission) Patient's cognitive ability adequate to  safely complete daily activities?: Yes Is the patient deaf or have difficulty hearing?: No Does the patient have difficulty seeing, even when wearing glasses/contacts?: No Does the patient have difficulty concentrating, remembering, or making decisions?: No Patient able to express need for assistance with ADLs?: Yes Does the patient have difficulty dressing or bathing?: No Independently performs ADLs?: Yes (appropriate for developmental age) Does the patient have difficulty walking or climbing stairs?: Yes Weakness of Legs: Right Weakness of Arms/Hands: None  Permission Sought/Granted Permission sought to share information with : Family Supports, Case Production designer, theatre/television/film, Photographer granted to share info w AGENCY: [facility]        Emotional Assessment Appearance:: Appears stated age Attitude/Demeanor/Rapport: Engaged Affect (typically observed): Appropriate Orientation: : Oriented to Self, Oriented to Place Alcohol / Substance Use: Not Applicable Psych Involvement: No (comment)  Admission diagnosis:  Hypokalemia [E87.6] Pulmonary embolism with acute cor pulmonale [I26.09] Acute pulmonary embolism with acute cor pulmonale, unspecified pulmonary embolism type [I26.09] Patient Active Problem List   Diagnosis Date Noted   Acute hypoxic respiratory failure 02/28/2023   Pulmonary embolism with acute cor pulmonale 02/27/2023   Primary osteoarthritis of right knee 02/19/2023   Status post total knee replacement, right 02/19/2023   Cyst of skin 11/18/2021   Primary osteoarthritis of both knees 05/18/2020   Uterine prolapse 03/26/2019   Rectocele 03/26/2019   Postmenopausal bleeding 03/26/2019   OAB (overactive bladder) 03/26/2019   Midline cystocele 03/26/2019   S/P CABG (coronary artery bypass graft) 11/10/2013   Essential hypertension 11/10/2013   S/P ascending aortic aneurysm repair 10/10/2013  Hyperlipidemia 08/15/2013   PCP:   [name], DO Pharmacy:   [facility] [address] [place] - [address] [place] [address] Phone: [phone] Fax: [phone]     Social Determinants of Health (SDOH) Social History: SDOH Screenings   Food Insecurity: No Food Insecurity (02/28/2023)  Housing: Low Risk  (02/28/2023)  Transportation Needs: No Transportation Needs (02/28/2023)  Utilities: Not At Risk (02/28/2023)  Alcohol Screen: Low Risk  (05/15/2022)  Depression (PHQ2-9): Low Risk  (11/20/2022)  Financial Resource Strain: Low Risk  (05/14/2022)  Physical Activity: Insufficiently Active (05/14/2022)  Social Connections: Moderately Integrated (05/15/2022)  Stress: No Stress Concern Present (05/14/2022)  Tobacco Use: Low Risk  (02/27/2023)    Readmission Risk Interventions     No data to display

## 2023-03-01 NOTE — Evaluation (Signed)
Physical Therapy Evaluation Patient Details Name: Alicia Herrera MRN: [id number] DOB: 10-07-1946 Today's Date: 03/01/2023  History of Present Illness  77 yo female presents to ED on 4/16 with R-sided chest pain, CT confirms PE with R heart strain. RLE doppler shows almost occlusive thrombus of R greater saphenous vein at sahpenofemoral junction down to mid thigh,  Occlusive thrombus of the right peroneal and soleal vein within the calf. PMH includes R TKA 02/19/23, CAD s/p CABG  and valve replacement, AAA repair, HDL, and HTN.  Clinical Impression   Pt presents with generalized weakness, impaired activity tolerance vs baseline. Pt to benefit from acute PT to address deficits. Pt ambulated hallway distance with use of RW and supervision for safety, pt maintained SPO2 89% and greater on RA throughout. PT discussed importance of pacing activities, frequent mobility at home, and assist from family as needed once d/c. PT to progress mobility as tolerated, and will continue to follow acutely.         Recommendations for follow up therapy are one component of a multi-disciplinary discharge planning process, led by the attending physician.  Recommendations may be updated based on patient status, additional functional criteria and insurance authorization.  Follow Up Recommendations       Assistance Recommended at Discharge Intermittent Supervision/Assistance  Patient can return home with the following  A little help with walking and/or transfers;A little help with bathing/dressing/bathroom;Assistance with cooking/housework;Assist for transportation;Help with stairs or ramp for entrance    Equipment Recommendations None recommended by PT  Recommendations for Other Services       Functional Status Assessment Patient has had a recent decline in their functional status and demonstrates the ability to make significant improvements in function in a reasonable and predictable amount of time.      Precautions / Restrictions Precautions Precautions: Fall Restrictions Weight Bearing Restrictions: No      Mobility  Bed Mobility   Bed Mobility: Sit to Supine       Sit to supine: Supervision, HOB elevated   General bed mobility comments: on BSC upon PT arrival to room, supervision for return to supine with increased time    Transfers Overall transfer level: Needs assistance Equipment used: Rolling walker (2 wheels) Transfers: Sit to/from Stand Sit to Stand: Supervision           General transfer comment: for safety, slowed rise    Ambulation/Gait Ambulation/Gait assistance: Supervision Gait Distance (Feet): 200 Feet Assistive device: Rolling walker (2 wheels) Gait Pattern/deviations: Step-through pattern, Decreased stride length, Trunk flexed Gait velocity: decr     General Gait Details: slowed, but steady. SPO2 89% and greater on RA  Stairs            Wheelchair Mobility    Modified Rankin (Stroke Patients Only)       Balance Overall balance assessment: Needs assistance Sitting-balance support: Single extremity supported Sitting balance-Leahy Scale: Fair     Standing balance support: Bilateral upper extremity supported, Reliant on assistive device for balance, During functional activity Standing balance-Leahy Scale: Poor                               Pertinent Vitals/Pain Pain Assessment Pain Assessment: Faces Faces Pain Scale: Hurts a little bit Pain Location: R knee Pain Descriptors / Indicators: Sore Pain Intervention(s): Limited activity within patient's tolerance, Monitored during session, Repositioned    Home Living Family/patient expects to be discharged to:: Private residence Living Arrangements:  Alone Available Help at Discharge: Family Type of Home: House Home Access: Stairs to enter Entrance Stairs-Rails: Left Entrance Stairs-Number of Steps: 3   Home Layout: Multi-level;Able to live on main level with  bedroom/bathroom Home Equipment: Shower seat;Grab bars - tub/shower      Prior Function Prior Level of Function : Independent/Modified Independent             Mobility Comments: pt reports using RW intermittently, just finished HHPT and planning to go to OPPT starting monday       Hand Dominance   Dominant Hand: Right    Extremity/Trunk Assessment        Lower Extremity Assessment Lower Extremity Assessment: Generalized weakness;RLE deficits/detail RLE Deficits / Details: post-op TKR    Cervical / Trunk Assessment Cervical / Trunk Assessment: Normal  Communication   Communication: No difficulties  Cognition Arousal/Alertness: Awake/alert Behavior During Therapy: WFL for tasks assessed/performed Overall Cognitive Status: Within Functional Limits for tasks assessed                                          General Comments      Exercises     Assessment/Plan    PT Assessment Patient needs continued PT services  PT Problem List Decreased strength;Decreased range of motion;Decreased activity tolerance;Decreased balance;Decreased mobility;Pain       PT Treatment Interventions DME instruction;Gait training;Stair training;Functional mobility training;Therapeutic activities;Therapeutic exercise;Balance training;Neuromuscular re-education;Patient/family education    PT Goals (Current goals can be found in the Care Plan section)  Acute Rehab PT Goals PT Goal Formulation: With patient Time For Goal Achievement: 03/15/23 Potential to Achieve Goals: Good    Frequency Min 1X/week     Co-evaluation               AM-PAC PT "6 Clicks" Mobility  Outcome Measure Help needed turning from your back to your side while in a flat bed without using bedrails?: A Little Help needed moving from lying on your back to sitting on the side of a flat bed without using bedrails?: A Little Help needed moving to and from a bed to a chair (including a  wheelchair)?: A Little Help needed standing up from a chair using your arms (e.g., wheelchair or bedside chair)?: A Little Help needed to walk in hospital room?: A Little Help needed climbing 3-5 steps with a railing? : A Little 6 Click Score: 18    End of Session   Activity Tolerance: Patient tolerated treatment well;No increased pain Patient left: with call bell/phone within reach;with family/visitor present Nurse Communication: Mobility status PT Visit Diagnosis: Unsteadiness on feet (R26.81);Other abnormalities of gait and mobility (R26.89);Muscle weakness (generalized) (M62.81);Difficulty in walking, not elsewhere classified (R26.2);Pain Pain - Right/Left: Right Pain - part of body: Knee    Time: 6295-2841 PT Time Calculation (min) (ACUTE ONLY): 23 min   Charges:   PT Evaluation $PT Eval Low Complexity: 1 Low PT Treatments $Therapeutic Activity: 8-22 mins        Marye Round, PT DPT Acute Rehabilitation Services Pager [phone]  Office [phone]   Chrislynn Mosely E Stroup 03/01/2023, 11:30 AM

## 2023-03-02 ENCOUNTER — Encounter: Payer: Self-pay | Admitting: *Deleted

## 2023-03-02 ENCOUNTER — Telehealth: Payer: Self-pay | Admitting: *Deleted

## 2023-03-02 DIAGNOSIS — I2510 Atherosclerotic heart disease of native coronary artery without angina pectoris: Secondary | ICD-10-CM | POA: Diagnosis not present

## 2023-03-02 DIAGNOSIS — M1712 Unilateral primary osteoarthritis, left knee: Secondary | ICD-10-CM | POA: Diagnosis not present

## 2023-03-02 DIAGNOSIS — E039 Hypothyroidism, unspecified: Secondary | ICD-10-CM | POA: Diagnosis not present

## 2023-03-02 DIAGNOSIS — Z791 Long term (current) use of non-steroidal anti-inflammatories (NSAID): Secondary | ICD-10-CM | POA: Diagnosis not present

## 2023-03-02 DIAGNOSIS — Z471 Aftercare following joint replacement surgery: Secondary | ICD-10-CM | POA: Diagnosis not present

## 2023-03-02 DIAGNOSIS — Z7982 Long term (current) use of aspirin: Secondary | ICD-10-CM | POA: Diagnosis not present

## 2023-03-02 DIAGNOSIS — I10 Essential (primary) hypertension: Secondary | ICD-10-CM | POA: Diagnosis not present

## 2023-03-02 DIAGNOSIS — N3281 Overactive bladder: Secondary | ICD-10-CM | POA: Diagnosis not present

## 2023-03-02 DIAGNOSIS — E785 Hyperlipidemia, unspecified: Secondary | ICD-10-CM | POA: Diagnosis not present

## 2023-03-02 NOTE — Transitions of Care (Post Inpatient/ED Visit) (Signed)
03/02/2023  Name: Alicia Herrera MRN: [id number] DOB: June 16, 1946  Today's TOC FU Call Status: Today's TOC FU Call Status:: Successful TOC FU Call Competed TOC FU Call Complete Date: 03/02/23  Transition Care Management Follow-up Telephone Call Date of Discharge: 03/01/23 Discharge Facility: [facility]) Type of Discharge: Inpatient Admission Primary Inpatient Discharge Diagnosis:: Pulmonary embolism/ Acute respiratory Failure/ SOB How have you been since you were released from the hospital?: Better ("I am doing okay; recuperating after the recent knee surgery, my daughter is here staying with me temporarily.  I am able to do everything for myself, not having any problems.  Thanks for getting this appointment with Dr. Ashley Royalty set up for me") Any questions or concerns?: No  Items Reviewed: Did you receive and understand the discharge instructions provided?: Yes (thoroughly reviewed with patient who verbalizes good understanding of same) Medications obtained and verified?: Yes (Medications Reviewed) (Full medication reconciliation/ review completed; no concerns or discrepancies identified; confirmed patient obtained/ is taking all newly Rx'd medications as instructed; self-manages medications and denies questions/ concerns around medications today) Any new allergies since your discharge?: No Dietary orders reviewed?: Yes Type of Diet Ordered:: "Heart Healthy" Do you have support at home?: Yes People in Home: alone Name of Support/Comfort Primary Source: Reports resides alone at baseline; family staying with her post-recent hospitalization; independent in self-care activities; local family assists as/ if needed/ indicated  Home Care and Equipment/Supplies: Were Home Health Services Ordered?: No (reports has last home health PT session today from recent elective knee surgery- reports to start outpatient PT on Monday 03/05/23) Any new equipment or medical supplies ordered?: Yes (IS- no other  DME; already has walker and uses when she goes out, as needed) Name of Medical supply agency?: N/A-- [facility] only Were you able to get the equipment/medical supplies?: Yes Do you have any questions related to the use of the equipment/supplies?: No  Functional Questionnaire: Do you need assistance with bathing/showering or dressing?: No (family assists as/ if needed) Do you need assistance with meal preparation?: No (family assists as/ if needed) Do you need assistance with eating?: No Do you have difficulty maintaining continence: No Do you need assistance with getting out of bed/getting out of a chair/moving?: No Do you have difficulty managing or taking your medications?: No  Follow up appointments reviewed: PCP Follow-up appointment confirmed?: Yes (care coordination outreach in real-time with scheduling care guide to successfully schedule hospital follow up PCP appointment 03/06/23) Date of PCP follow-up appointment?: 03/06/23 Follow-up Provider: PCP Specialist Hospital Follow-up appointment confirmed?: Yes Date of Specialist follow-up appointment?: 03/05/23 Follow-Up Specialty Provider:: orthopedic surgeon Do you need transportation to your follow-up appointment?: No Do you understand care options if your condition(s) worsen?: Yes-patient verbalized understanding  SDOH Interventions Today    Flowsheet Row Most Recent Value  SDOH Interventions   Food Insecurity Interventions Intervention Not Indicated  Transportation Interventions Intervention Not Indicated  [normally drives self,  family providing transportation after recent knee surgery]      TOC Interventions Today    Flowsheet Row Most Recent Value  TOC Interventions   TOC Interventions Discussed/Reviewed TOC Interventions Discussed, Arranged PCP follow up within 7 days/Care Guide scheduled      Interventions Today    Flowsheet Row Most Recent Value  Chronic Disease   Chronic disease during today's visit Other   [pulmonary embolism after recent knee surgery]  General Interventions   General Interventions Discussed/Reviewed General Interventions Discussed, Doctor Visits, Durable Medical Equipment (DME)  Doctor Visits Discussed/Reviewed  Specialist, Doctor Visits Discussed, PCP  Durable Medical Equipment (DME) Walker  PCP/Specialist Visits Compliance with follow-up visit  Exercise Interventions   Exercise Discussed/Reviewed Exercise Discussed  [home health PT and transition to outpatient PT starting on 03/05/23]  Nutrition Interventions   Nutrition Discussed/Reviewed Nutrition Discussed  Pharmacy Interventions   Pharmacy Dicussed/Reviewed Pharmacy Topics Discussed  [Full medication review with updating medication list in EHR per patient report]  Safety Interventions   Safety Discussed/Reviewed Safety Discussed  [confirmed occasionally requiring/ using assistive devices post-recent knee surgery]      Caryl Pina, RN, BSN, CCRN Alumnus RN CM Care Coordination/ Transition of Care- [facility] Care Management [phone]: direct office

## 2023-03-05 ENCOUNTER — Ambulatory Visit: Payer: Medicare HMO | Admitting: Rehabilitative and Restorative Service Providers"

## 2023-03-05 DIAGNOSIS — M25561 Pain in right knee: Secondary | ICD-10-CM | POA: Diagnosis not present

## 2023-03-06 ENCOUNTER — Encounter: Payer: Self-pay | Admitting: Family Medicine

## 2023-03-06 ENCOUNTER — Ambulatory Visit (INDEPENDENT_AMBULATORY_CARE_PROVIDER_SITE_OTHER): Payer: Medicare HMO | Admitting: Family Medicine

## 2023-03-06 VITALS — BP 119/70 | HR 85 | Ht 67.0 in | Wt 177.0 lb

## 2023-03-06 DIAGNOSIS — E039 Hypothyroidism, unspecified: Secondary | ICD-10-CM | POA: Diagnosis not present

## 2023-03-06 DIAGNOSIS — I2609 Other pulmonary embolism with acute cor pulmonale: Secondary | ICD-10-CM

## 2023-03-06 DIAGNOSIS — M545 Low back pain, unspecified: Secondary | ICD-10-CM | POA: Diagnosis not present

## 2023-03-06 DIAGNOSIS — I2510 Atherosclerotic heart disease of native coronary artery without angina pectoris: Secondary | ICD-10-CM | POA: Diagnosis not present

## 2023-03-06 DIAGNOSIS — N3281 Overactive bladder: Secondary | ICD-10-CM | POA: Diagnosis not present

## 2023-03-06 DIAGNOSIS — E785 Hyperlipidemia, unspecified: Secondary | ICD-10-CM | POA: Diagnosis not present

## 2023-03-06 DIAGNOSIS — M1712 Unilateral primary osteoarthritis, left knee: Secondary | ICD-10-CM | POA: Diagnosis not present

## 2023-03-06 DIAGNOSIS — Z7982 Long term (current) use of aspirin: Secondary | ICD-10-CM | POA: Diagnosis not present

## 2023-03-06 DIAGNOSIS — Z471 Aftercare following joint replacement surgery: Secondary | ICD-10-CM | POA: Diagnosis not present

## 2023-03-06 DIAGNOSIS — Z791 Long term (current) use of non-steroidal anti-inflammatories (NSAID): Secondary | ICD-10-CM | POA: Diagnosis not present

## 2023-03-06 DIAGNOSIS — I10 Essential (primary) hypertension: Secondary | ICD-10-CM | POA: Diagnosis not present

## 2023-03-06 MED ORDER — APIXABAN 5 MG PO TABS
5.00 mg | ORAL_TABLET | Freq: Two times a day (BID) | ORAL | 2 refills | Status: DC
Start: 2023-03-06 — End: 2023-05-21

## 2023-03-06 NOTE — Progress Notes (Signed)
Alicia Herrera - 77 y.o. female MRN [id number]  Date of birth: 10-28-1946  Subjective Chief Complaint  Patient presents with   Hospitalization Follow-up    HPI Alicia Herrera is a 77 y.o. female here today for hospital follow up.    She has TKA on 02/19/23.  Presented to ED with complaint of chest pain and dyspnea.  Notes, images and lab work from hospitalization reviewed.  Noted to have extensive PE on CT angio of the chest.  Initially started on heparin and transitioned to Eliquis.  Echo did not show any significant R heart strain.  Today she reports that she is feeling pretty well.  O2 saturations have remained stable.  She is having some lower back pain started about a week ago.  Worse with movement.  No radiation to lower extremities.  No bowel or bladder changes.  ROS:  A comprehensive ROS was completed and negative except as noted per HPI   No Known Allergies  Past Medical History:  Diagnosis Date   Arthritis    Coronary artery disease    Heart murmur    Hx of CABG    Hypertension    Hypothyroidism    hx of 25 years ago per pt   Rectocele    Uterine prolapse     Past Surgical History:  Procedure Laterality Date   CARDIAC VALVE REPLACEMENT     AVR   CORONARY ARTERY BYPASS GRAFT     prolapsed uterus surgery      TOTAL KNEE ARTHROPLASTY Right 02/19/2023   Procedure: TOTAL KNEE ARTHROPLASTY;  Surgeon: Jodi Geralds, MD;  Location: [facility];  Service: Orthopedics;  Laterality: Right;    Social History   Socioeconomic History   Marital status: Widowed    Spouse name: Not on file   Number of children: 4   Years of education: 17   Highest education level: Bachelor's degree (e.g., BA, AB, BS)  Occupational History   Occupation: Retired   Occupation: Works part time    Comment: 3  mornings at the preschool   Occupation: Words part-time at [employer]  Tobacco Use   Smoking status: Never   Smokeless tobacco: Never  Vaping Use   Vaping Use: Never used  Substance and Sexual  Activity   Alcohol use: Yes    Alcohol/week: 1.0 standard drink of alcohol    Types: 1 Glasses of wine per week    Comment: Occasionally   Drug use: Never   Sexual activity: Not Currently    Birth control/protection: Abstinence  Other Topics Concern   Not on file  Social History Narrative   Lives alone but two of her children live close by. She watches her grandkids everyday and sees her family every day. She also has a puppy. She has not been able to exercise to due her knees.    Social Determinants of Health   Financial Resource Strain: Low Risk  (03/03/2023)   Overall Financial Resource Strain (CARDIA)    Difficulty of Paying Living Expenses: Not hard at all  Food Insecurity: No Food Insecurity (03/03/2023)   Hunger Vital Sign    Worried About Running Out of Food in the Last Year: Never true    Ran Out of Food in the Last Year: Never true  Transportation Needs: No Transportation Needs (03/03/2023)   PRAPARE - Administrator, Civil Service (Medical): No    Lack of Transportation (Non-Medical): No  Physical Activity: Inactive (03/03/2023)   Exercise Vital Sign  Days of Exercise per Week: 0 days    Minutes of Exercise per Session: 10 min  Stress: No Stress Concern Present (03/03/2023)   [employer] of Occupational Health - Occupational Stress Questionnaire    Feeling of Stress : Only a little  Social Connections: Moderately Integrated (03/03/2023)   Social Connection and Isolation Panel [NHANES]    Frequency of Communication with Friends and Family: More than three times a week    Frequency of Social Gatherings with Friends and Family: More than three times a week    Attends Religious Services: More than 4 times per year    Active Member of [employer] or Organizations: Yes    Attends Banker Meetings: More than 4 times per year    Marital Status: Widowed    Family History  Problem Relation Age of Onset   Hypertension Mother    Heart attack Mother     Cancer Father    Penile cancer Father    Diabetes Sister     Health Maintenance  Topic Date Due   COVID-19 Vaccine (3 - 2023-24 season) 03/22/2023 (Originally 07/14/2022)   Hepatitis C Screening  05/16/2023 (Originally [birth year]20/1964)   Zoster Vaccines- Shingrix (1 of 2) 06/05/2023 (Originally 01/20/1995)   Pneumonia Vaccine 78+ Years old (1 of 1 - PCV) 03/05/2024 (Originally 01/19/2010)   DEXA SCAN  03/05/2024 (Originally 01/19/2010)   Medicare Annual Wellness (AWV)  05/16/2023   INFLUENZA VACCINE  06/14/2023   HPV VACCINES  Aged Out   DTaP/Tdap/Td  Discontinued     ----------------------------------------------------------------------------------------------------------------------------------------------------------------------------------------------------------------- Physical Exam BP 119/70 (BP Location: Left Arm, Patient Position: Sitting, Cuff Size: Large)   Pulse 85   Ht  (1.702 m)   Wt 177 lb (80.3 kg)   SpO2 98%   BMI 27.72 kg/m   Physical Exam Constitutional:      Appearance: Normal appearance.  HENT:     Head: Normocephalic and atraumatic.  Eyes:     General: No scleral icterus. Cardiovascular:     Rate and Rhythm: Normal rate and regular rhythm.  Pulmonary:     Effort: Pulmonary effort is normal.     Breath sounds: Normal breath sounds.  Musculoskeletal:     Comments: Tenderness palpation along the lumbar paraspinals bilaterally.  Neurological:     Mental Status: She is alert.  Psychiatric:        Mood and Affect: Mood normal.        Behavior: Behavior normal.     ------------------------------------------------------------------------------------------------------------------------------------------------------------------------------------------------------------------- Assessment and Plan  Pulmonary embolism with acute cor pulmonale She is doing well with eliquis.  She will continue this for 3 to 4 months.  Refills for medications provided.  I will plan  to follow-up with her in about 3 months.  Rechecking BMP and CBC today.  Low back pain Seems muscular in nature.  Recommend adding topical muscle rub and/or application of heat.  She will let me know if she develops any new symptoms.   Meds ordered this encounter  Medications   apixaban (ELIQUIS) 5 MG TABS tablet    Sig: Take 1 tablet (5 mg total) by mouth 2 (two) times daily.    Dispense:  60 tablet    Refill:  2    No follow-ups on file.    This visit occurred during the SARS-CoV-2 public health emergency.  Safety protocols were in place, including screening questions prior to the visit, additional usage of staff PPE, and extensive cleaning of exam room while observing appropriate contact  time as indicated for disinfecting solutions.

## 2023-03-06 NOTE — Assessment & Plan Note (Signed)
She is doing well with eliquis.  She will continue this for 3 to 4 months.  Refills for medications provided.  I will plan to follow-up with her in about 3 months.  Rechecking BMP and CBC today.

## 2023-03-06 NOTE — Assessment & Plan Note (Signed)
Seems muscular in nature.  Recommend adding topical muscle rub and/or application of heat.  She will let me know if she develops any new symptoms.

## 2023-03-06 NOTE — Patient Instructions (Addendum)
Continue eliquis. I have sent over additional refills of this.   Try mouse pad with wrist pad.  Try muscle rub or heat to lower back. Let me know if worsening or if other symptoms develop.

## 2023-03-07 DIAGNOSIS — Z7982 Long term (current) use of aspirin: Secondary | ICD-10-CM | POA: Diagnosis not present

## 2023-03-07 DIAGNOSIS — I10 Essential (primary) hypertension: Secondary | ICD-10-CM | POA: Diagnosis not present

## 2023-03-07 DIAGNOSIS — E785 Hyperlipidemia, unspecified: Secondary | ICD-10-CM | POA: Diagnosis not present

## 2023-03-07 DIAGNOSIS — M1712 Unilateral primary osteoarthritis, left knee: Secondary | ICD-10-CM | POA: Diagnosis not present

## 2023-03-07 DIAGNOSIS — Z791 Long term (current) use of non-steroidal anti-inflammatories (NSAID): Secondary | ICD-10-CM | POA: Diagnosis not present

## 2023-03-07 DIAGNOSIS — I2510 Atherosclerotic heart disease of native coronary artery without angina pectoris: Secondary | ICD-10-CM | POA: Diagnosis not present

## 2023-03-07 DIAGNOSIS — Z471 Aftercare following joint replacement surgery: Secondary | ICD-10-CM | POA: Diagnosis not present

## 2023-03-07 DIAGNOSIS — E039 Hypothyroidism, unspecified: Secondary | ICD-10-CM | POA: Diagnosis not present

## 2023-03-07 DIAGNOSIS — N3281 Overactive bladder: Secondary | ICD-10-CM | POA: Diagnosis not present

## 2023-03-07 LAB — BASIC METABOLIC PANEL
BUN: 13 mg/dL (ref 7–25)
CO2: 29 mmol/L (ref 20–32)
Calcium: 9.2 mg/dL (ref 8.6–10.4)
Chloride: 98 mmol/L (ref 98–110)
Creat: 0.79 mg/dL (ref 0.60–1.00)
Glucose, Bld: 129 mg/dL — ABNORMAL HIGH (ref 65–99)
Potassium: 3.7 mmol/L (ref 3.5–5.3)
Sodium: 137 mmol/L (ref 135–146)

## 2023-03-07 LAB — CBC WITH DIFFERENTIAL/PLATELET
Absolute Monocytes: 612 cells/uL (ref 200–950)
Basophils Absolute: 43 cells/uL (ref 0–200)
Basophils Relative: 0.6 %
Eosinophils Absolute: 230 cells/uL (ref 15–500)
Eosinophils Relative: 3.2 %
HCT: 33.6 % — ABNORMAL LOW (ref 35.0–45.0)
Hemoglobin: 10.7 g/dL — ABNORMAL LOW (ref 11.7–15.5)
Lymphs Abs: 1670 cells/uL (ref 850–3900)
MCH: 26.2 pg — ABNORMAL LOW (ref 27.0–33.0)
MCHC: 31.8 g/dL — ABNORMAL LOW (ref 32.0–36.0)
MCV: 82.2 fL (ref 80.0–100.0)
MPV: 8.4 fL (ref 7.5–12.5)
Monocytes Relative: 8.5 %
Neutro Abs: 4644 cells/uL (ref 1500–7800)
Neutrophils Relative %: 64.5 %
Platelets: 479 10*3/uL — ABNORMAL HIGH (ref 140–400)
RBC: 4.09 10*6/uL (ref 3.80–5.10)
RDW: 14.2 % (ref 11.0–15.0)
Total Lymphocyte: 23.2 %
WBC: 7.2 10*3/uL (ref 3.8–10.8)

## 2023-03-09 DIAGNOSIS — E785 Hyperlipidemia, unspecified: Secondary | ICD-10-CM | POA: Diagnosis not present

## 2023-03-09 DIAGNOSIS — Z791 Long term (current) use of non-steroidal anti-inflammatories (NSAID): Secondary | ICD-10-CM | POA: Diagnosis not present

## 2023-03-09 DIAGNOSIS — N3281 Overactive bladder: Secondary | ICD-10-CM | POA: Diagnosis not present

## 2023-03-09 DIAGNOSIS — Z471 Aftercare following joint replacement surgery: Secondary | ICD-10-CM | POA: Diagnosis not present

## 2023-03-09 DIAGNOSIS — Z7982 Long term (current) use of aspirin: Secondary | ICD-10-CM | POA: Diagnosis not present

## 2023-03-09 DIAGNOSIS — I2510 Atherosclerotic heart disease of native coronary artery without angina pectoris: Secondary | ICD-10-CM | POA: Diagnosis not present

## 2023-03-09 DIAGNOSIS — E039 Hypothyroidism, unspecified: Secondary | ICD-10-CM | POA: Diagnosis not present

## 2023-03-09 DIAGNOSIS — M1712 Unilateral primary osteoarthritis, left knee: Secondary | ICD-10-CM | POA: Diagnosis not present

## 2023-03-09 DIAGNOSIS — I10 Essential (primary) hypertension: Secondary | ICD-10-CM | POA: Diagnosis not present

## 2023-03-13 ENCOUNTER — Ambulatory Visit: Payer: Medicare HMO | Attending: Orthopedic Surgery | Admitting: Physical Therapy

## 2023-03-13 ENCOUNTER — Other Ambulatory Visit: Payer: Self-pay

## 2023-03-13 ENCOUNTER — Encounter: Payer: Self-pay | Admitting: Physical Therapy

## 2023-03-13 DIAGNOSIS — R262 Difficulty in walking, not elsewhere classified: Secondary | ICD-10-CM | POA: Diagnosis not present

## 2023-03-13 DIAGNOSIS — R600 Localized edema: Secondary | ICD-10-CM | POA: Diagnosis not present

## 2023-03-13 DIAGNOSIS — M25661 Stiffness of right knee, not elsewhere classified: Secondary | ICD-10-CM | POA: Diagnosis not present

## 2023-03-13 DIAGNOSIS — M6281 Muscle weakness (generalized): Secondary | ICD-10-CM | POA: Insufficient documentation

## 2023-03-13 DIAGNOSIS — G8929 Other chronic pain: Secondary | ICD-10-CM | POA: Diagnosis not present

## 2023-03-13 DIAGNOSIS — M25561 Pain in right knee: Secondary | ICD-10-CM | POA: Insufficient documentation

## 2023-03-13 NOTE — Therapy (Signed)
OUTPATIENT PHYSICAL THERAPY LOWER EXTREMITY EVALUATION   Patient Name: Alicia Herrera MRN: [id number] DOB:July 09, 1946, 77 y.o., female Today's Date: 03/13/2023  END OF SESSION:  PT End of Session - 03/13/23 1313     Visit Number 1    Number of Visits 16    Date for PT Re-Evaluation 05/08/23    Authorization Type [employer] -- auth request 03/13/23    Progress Note Due on Visit 10    PT Start Time 1315    PT Stop Time 1400    PT Time Calculation (min) 45 min    Activity Tolerance Patient tolerated treatment well;No increased pain    Behavior During Therapy WFL for tasks assessed/performed             Past Medical History:  Diagnosis Date   Arthritis    Coronary artery disease    Heart murmur    Hx of CABG    Hypertension    Hypothyroidism    hx of 25 years ago per pt   Rectocele    Uterine prolapse    Past Surgical History:  Procedure Laterality Date   CARDIAC VALVE REPLACEMENT     AVR   CORONARY ARTERY BYPASS GRAFT     prolapsed uterus surgery      TOTAL KNEE ARTHROPLASTY Right 02/19/2023   Procedure: TOTAL KNEE ARTHROPLASTY;  Surgeon: Jodi Geralds, MD;  Location: [facility];  Service: Orthopedics;  Laterality: Right;   Patient Active Problem List   Diagnosis Date Noted   Low back pain 03/06/2023   Pulmonary embolism with acute cor pulmonale (HCC) 02/27/2023   Primary osteoarthritis of right knee 02/19/2023   Status post total knee replacement, right 02/19/2023   Cyst of skin 11/18/2021   Primary osteoarthritis of both knees 05/18/2020   Uterine prolapse 03/26/2019   Rectocele 03/26/2019   Postmenopausal bleeding 03/26/2019   OAB (overactive bladder) 03/26/2019   Midline cystocele 03/26/2019   S/P CABG (coronary artery bypass graft) 11/10/2013   Essential hypertension 11/10/2013   S/P ascending aortic aneurysm repair 10/10/2013   Hyperlipidemia 08/15/2013    PCP: [name] DO  REFERRING PROVIDER: Jodi Geralds, MD  REFERRING DIAG: [phone]  (ICD-10-CM) - S/P R total knee arthroplasty  THERAPY DIAG:  Chronic pain of right knee  Stiffness of right knee, not elsewhere classified  Difficulty in walking, not elsewhere classified  Localized edema  Muscle weakness (generalized)  Rationale for Evaluation and Treatment: Rehabilitation  ONSET DATE: 02/19/23  SUBJECTIVE:   SUBJECTIVE STATEMENT: Pt reports 2 weeks after her surgery she had blood clots in her legs that went into her lungs. Pt reports she was in hospital for 48 hours. Pt will be on blood thinners 3-6 months. 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 Pt reports she is no longer using the walker. Pt notes thigh tightness/soreness. Pt did get HHPT for 8 visits. Pt states she still has pain with full R LE weightbearing.   PERTINENT HISTORY: L knee OA  PAIN:  Are you having pain? Yes: NPRS scale: 1/10 Pain location: along incision Pain description: Soreness Aggravating factors: Compression stockings Relieving factors: Ice  PRECAUTIONS: None  WEIGHT BEARING RESTRICTIONS: No  FALLS:  Has patient fallen in last 6 months? 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  PLOF: Independent  PATIENT GOALS: Return to gardening, walking, and sewing  NEXT MD VISIT: Middle of May  OBJECTIVE:   DIAGNOSTIC FINDINGS: Ultrasound 02/27/23 that demonstrated R LE DVT  PATIENT SURVEYS:  LEFS 51.2%  COGNITION: Overall cognitive status: Within functional limits for tasks assessed   SENSATION: WFL  EDEMA:  Circumferential: 48.5 cm (R), 42.8 (L)  MUSCLE LENGTH: Hamstrings: Right 65 deg; Left 90 deg Thomas test: did not assess  POSTURE:  L knee in slight varus and  knee flexion; R knee in slight knee flexion  PALPATION: Patellar mobility WNL on R; mildly hypomobile on L with medial/lateral movements TTP and tightness in R hamstring  LOWER EXTREMITY ROM:  A/P ROM Right eval Left eval  Hip flexion    Hip extension    Hip abduction    Hip adduction    Hip internal rotation    Hip external rotation    Knee flexion 105/110 110/110  Knee extension -10/-10 -5/-5  Ankle dorsiflexion    Ankle plantarflexion    Ankle inversion    Ankle eversion     (Blank rows = not tested)  LOWER EXTREMITY MMT:  MMT Right eval Left eval  Hip flexion 4+ 3+  Hip extension 4- 4-  Hip abduction 3+ 3+  Hip adduction    Hip internal rotation    Hip external rotation    Knee flexion 5 5  Knee extension 4 5  Ankle dorsiflexion    Ankle plantarflexion    Ankle inversion    Ankle eversion     (Blank rows = not tested)  LOWER EXTREMITY SPECIAL TESTS:  Did not assess  FUNCTIONAL TESTS:  5 times sit to stand: 25.19 sec with UEs Tandem stance: 29.81 sec on R LE; 37.16 sec on L LE; SLS: 2 sec on R, 3 sec on L  GAIT: Distance walked: 100' Assistive device utilized: None Level of assistance: Complete Independence Comments: R knee slightly flexed throughout gait, decreased knee flexion  during swing and decreased knee extension during heel strike   TODAY'S TREATMENT:                                                                                                                              DATE: 03/13/23  See HEP below  PATIENT EDUCATION:  Education details: Exam findings, POC, initial HEP Person educated: Patient Education method: Explanation, Demonstration, and Handouts Education comprehension: verbalized understanding, returned demonstration, and needs further education  HOME EXERCISE PROGRAM: Access Code: [email or web address] URL: [email or web address] Date: 03/13/2023 Prepared by: Vernon Prey April Kirstie Peri  Exercises - Seated Hamstring  Stretch  - 1 x daily - 7 x weekly - 2 sets - 30 sec hold - Seated Knee Extension with Resistance  - 1 x daily - 7 x weekly - 2 sets - 10 reps - Seated Hamstring Curls with Resistance  - 1 x daily - 7  x weekly - 2 sets - 10 reps - Hip Abduction with Resistance Loop  - 1 x daily - 7 x weekly - 2 sets - 10 reps - Hip Extension with Resistance Loop  - 1 x daily - 7 x weekly - 2 sets - 10 reps - Quad Setting and Stretching  - 1 x daily - 7 x weekly - 2 sets - 10 reps - 3 sec hold  ASSESSMENT:  CLINICAL IMPRESSION: Patient is a 77 y.o. F who was seen today for physical therapy evaluation and treatment s/p R TKA on 02/19/23. Pt with DVT and PE ~1 week after surgery requiring hospitalization from 4/16 to 4/18. Pt has been seeing HHPT and is now ready to transition to OPPT. Assessment significant for decreased terminal knee extension, general hip weakness and quad/hamstring weakness. Pt demos decreased stability and endurance on R LE per 5x STS. Pt would benefit from PT to address these issues to maximize her level of function for return to community activities and yard work.   OBJECTIVE IMPAIRMENTS: Abnormal gait, decreased activity tolerance, decreased balance, decreased endurance, decreased mobility, difficulty walking, decreased ROM, decreased strength, hypomobility, increased edema, increased fascial restrictions, impaired flexibility, improper body mechanics, postural dysfunction, and pain.   ACTIVITY LIMITATIONS: bending, sitting, squatting, stairs, transfers, and locomotion level  PARTICIPATION LIMITATIONS: cleaning, driving, shopping, community activity, occupation, and yard work  PERSONAL FACTORS: Age, Fitness, Past/current experiences, and Time since onset of injury/illness/exacerbation are also affecting patient's functional outcome.   REHAB POTENTIAL: Good  CLINICAL DECISION MAKING: Stable/uncomplicated  EVALUATION COMPLEXITY: Low   GOALS: Goals reviewed with patient? Yes  SHORT TERM  GOALS: Target date: 04/10/2023  Pt will be ind with initial HEP Baseline: Goal status: INITIAL  2.  Pt will be able to ascend/descend stairs with normal reciprocal gait Baseline:  Goal status: INITIAL  LONG TERM GOALS: Target date: 05/08/2023   Pt will be ind with progression and advancement of HEP Baseline:  Goal status: INITIAL  2.  Pt will have improved R knee ROM from 0 to 115 deg Baseline:  Goal status: INITIAL  3.  Pt will have improved 5x STS to </=15 sec without UE support to demo increased functional LE strength Baseline:  Goal status: INITIAL  4.  Pt will be able to amb >/=1000' ind for community mobility Baseline:  Goal status: INITIAL  5.  Pt will have improved LEFS to at least 61% to demo MCID Baseline:  Goal status: INITIAL   PLAN:  PT FREQUENCY: 2x/week  PT DURATION: 8 weeks  PLANNED INTERVENTIONS: Therapeutic exercises, Therapeutic activity, Neuromuscular re-education, Balance training, Gait training, Patient/Family education, Self Care, Joint mobilization, Stair training, Aquatic Therapy, Dry Needling, Electrical stimulation, Cryotherapy, Moist heat, scar mobilization, Taping, Vasopneumatic device, Ionotophoresis 4mg /ml Dexamethasone, Manual therapy, and Re-evaluation  PLAN FOR NEXT SESSION: Assess response to HEP. Continue gross LE strengthening. Work on knee ROM as able.    Jsaon Yoo April Ma L Antara Brecheisen, PT 03/13/2023, 3:03 PM

## 2023-03-15 ENCOUNTER — Ambulatory Visit: Payer: Medicare HMO | Attending: Orthopedic Surgery

## 2023-03-15 DIAGNOSIS — M6281 Muscle weakness (generalized): Secondary | ICD-10-CM | POA: Diagnosis not present

## 2023-03-15 DIAGNOSIS — M25661 Stiffness of right knee, not elsewhere classified: Secondary | ICD-10-CM | POA: Diagnosis not present

## 2023-03-15 DIAGNOSIS — R262 Difficulty in walking, not elsewhere classified: Secondary | ICD-10-CM | POA: Diagnosis not present

## 2023-03-15 DIAGNOSIS — G8929 Other chronic pain: Secondary | ICD-10-CM | POA: Diagnosis not present

## 2023-03-15 DIAGNOSIS — R600 Localized edema: Secondary | ICD-10-CM | POA: Insufficient documentation

## 2023-03-15 DIAGNOSIS — M25561 Pain in right knee: Secondary | ICD-10-CM | POA: Insufficient documentation

## 2023-03-15 NOTE — Therapy (Signed)
OUTPATIENT PHYSICAL THERAPY LOWER EXTREMITY TREATMENT   Patient Name: Alicia Herrera MRN: [id number] DOB:August 18, 1946, 77 y.o., female Today's Date: 03/15/2023  END OF SESSION:  PT End of Session - 03/15/23 1545     Visit Number 2    Number of Visits 16    Date for PT Re-Evaluation 05/08/23    Authorization Type [employer] -- auth request 03/13/23    Authorization Time Period 12 visits approved for PT 03/13/2023-05/08/2023    Authorization - Visit Number 2    Authorization - Number of Visits 12    Progress Note Due on Visit 10    PT Start Time 1545   pt arrived late   PT Stop Time 1625    PT Time Calculation (min) 40 min    Activity Tolerance Patient tolerated treatment well    Behavior During Therapy WFL for tasks assessed/performed             Past Medical History:  Diagnosis Date   Arthritis    Coronary artery disease    Heart murmur    Hx of CABG    Hypertension    Hypothyroidism    hx of 25 years ago per pt   Rectocele    Uterine prolapse    Past Surgical History:  Procedure Laterality Date   CARDIAC VALVE REPLACEMENT     AVR   CORONARY ARTERY BYPASS GRAFT     prolapsed uterus surgery      TOTAL KNEE ARTHROPLASTY Right 02/19/2023   Procedure: TOTAL KNEE ARTHROPLASTY;  Surgeon: Jodi Geralds, MD;  Location: [facility];  Service: Orthopedics;  Laterality: Right;   Patient Active Problem List   Diagnosis Date Noted   Low back pain 03/06/2023   Pulmonary embolism with acute cor pulmonale (HCC) 02/27/2023   Primary osteoarthritis of right knee 02/19/2023   Status post total knee replacement, right 02/19/2023   Cyst of skin 11/18/2021   Primary osteoarthritis of both knees 05/18/2020   Uterine prolapse 03/26/2019   Rectocele 03/26/2019   Postmenopausal bleeding 03/26/2019   OAB (overactive bladder) 03/26/2019   Midline cystocele 03/26/2019   S/P CABG (coronary artery bypass graft) 11/10/2013   Essential hypertension 11/10/2013   S/P ascending aortic aneurysm  repair 10/10/2013   Hyperlipidemia 08/15/2013    PCP: [name] DO  REFERRING PROVIDER: Jodi Geralds, MD  REFERRING DIAG: [phone] (ICD-10-CM) - S/P R total knee arthroplasty  THERAPY DIAG:  Chronic pain of right knee  Stiffness of right knee, not elsewhere classified  Difficulty in walking, not elsewhere classified  Localized edema  Muscle weakness (generalized)  Rationale for Evaluation and Treatment: Rehabilitation  ONSET DATE: 02/19/23  SUBJECTIVE:   SUBJECTIVE STATEMENT: Patient reports 4/10 pain along lateral L thigh, as well as a separate 4/10 pain above anterior knee.   PERTINENT HISTORY: L knee OA  PAIN:  Are you having pain? Yes: NPRS scale: 1/10 Pain location: along incision Pain description: Soreness Aggravating factors: Compression stockings Relieving factors: Ice  PRECAUTIONS: None  WEIGHT BEARING RESTRICTIONS: No  FALLS:  Has patient fallen in last 6 months? 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  PLOF: Independent  PATIENT GOALS: Return to gardening, walking, and sewing  NEXT MD VISIT: 03/27/23  OBJECTIVE:  DIAGNOSTIC FINDINGS: Ultrasound 02/27/23 that demonstrated R LE DVT  PATIENT SURVEYS:  LEFS 51.2%  COGNITION: Overall cognitive status: Within functional limits for tasks assessed   SENSATION: WFL  EDEMA:  Circumferential: 48.5 cm (R), 42.8 (L)  MUSCLE LENGTH: Hamstrings: Right 65 deg; Left 90 deg Thomas test: did not assess  POSTURE:  L knee in slight varus and knee flexion; R knee in slight knee flexion  PALPATION: Patellar mobility WNL on R; mildly hypomobile on L with medial/lateral movements TTP and tightness in R  hamstring  LOWER EXTREMITY ROM:  A/P ROM Right eval Left eval  Hip flexion    Hip extension    Hip abduction    Hip adduction    Hip internal rotation    Hip external rotation    Knee flexion 105/110 110/110  Knee extension -10/-10 -5/-5  Ankle dorsiflexion    Ankle plantarflexion    Ankle inversion    Ankle eversion     (Blank rows = not tested)  LOWER EXTREMITY MMT:  MMT Right eval Left eval  Hip flexion 4+ 3+  Hip extension 4- 4-  Hip abduction 3+ 3+  Hip adduction    Hip internal rotation    Hip external rotation    Knee flexion 5 5  Knee extension 4 5  Ankle dorsiflexion    Ankle plantarflexion    Ankle inversion    Ankle eversion     (Blank rows = not tested)  LOWER EXTREMITY SPECIAL TESTS:  Did not assess  FUNCTIONAL TESTS:  5 times sit to stand: 25.19 sec with UEs Tandem stance: 29.81 sec on R LE; 37.16 sec on L LE; SLS: 2 sec on R, 3 sec on L  GAIT: Distance walked: 100' Assistive device utilized: None Level of assistance: Complete Independence Comments: R knee slightly flexed throughout gait, decreased knee flexion  during swing and decreased knee extension during heel strike   TODAY'S TREATMENT:   [facility] Adult PT Treatment:                                                DATE: 03/15/2023 Therapeutic Exercise: Supine ITB stretch 2x30" AAROM heel slides 10x5" SAQ (green bolster) 10x5" LAQ no resistance x5 --> YTB x10 Seated HS curl YTB x10 STS --> RTB above knees Seated clamshell RTB --> R unilateral clam R SL step up (4" step) 2x10 Modalities: Vaso, 34 deg, mild compression x                                                                                                                              DATE: 03/13/23  See HEP below  PATIENT EDUCATION:  Education details: Exam findings, POC, initial HEP Person educated: Patient Education method: Explanation, Demonstration, and Handouts Education comprehension: verbalized understanding,  returned demonstration, and needs  further education  HOME EXERCISE PROGRAM: Access Code: [email or web address] URL: [email or web address] Date: 03/13/2023 Prepared by: Vernon Prey April Kirstie Peri  Exercises - Seated Hamstring Stretch  - 1 x daily - 7 x weekly - 2 sets - 30 sec hold - Seated Knee Extension with Resistance  - 1 x daily - 7 x weekly - 2 sets - 10 reps - Seated Hamstring Curls with Resistance  - 1 x daily - 7 x weekly - 2 sets - 10 reps - Hip Abduction with Resistance Loop  - 1 x daily - 7 x weekly - 2 sets - 10 reps - Hip Extension with Resistance Loop  - 1 x daily - 7 x weekly - 2 sets - 10 reps - Quad Setting and Stretching  - 1 x daily - 7 x weekly - 2 sets - 10 reps - 3 sec hold  ASSESSMENT:  CLINICAL IMPRESSION: Lighter resistance used with seated knee flexion/extension exercise to decrease strain and increase ROM tolerance. Step ups performed on R LE to progress stair training.   OBJECTIVE IMPAIRMENTS: Abnormal gait, decreased activity tolerance, decreased balance, decreased endurance, decreased mobility, difficulty walking, decreased ROM, decreased strength, hypomobility, increased edema, increased fascial restrictions, impaired flexibility, improper body mechanics, postural dysfunction, and pain.   ACTIVITY LIMITATIONS: bending, sitting, squatting, stairs, transfers, and locomotion level  PARTICIPATION LIMITATIONS: cleaning, driving, shopping, community activity, occupation, and yard work  PERSONAL FACTORS: Age, Fitness, Past/current experiences, and Time since onset of injury/illness/exacerbation are also affecting patient's functional outcome.   REHAB POTENTIAL: Good  CLINICAL DECISION MAKING: Stable/uncomplicated  EVALUATION COMPLEXITY: Low   GOALS: Goals reviewed with patient? Yes  SHORT TERM GOALS: Target date: 04/10/2023  Pt will be ind with initial HEP Baseline: Goal status: INITIAL  2.  Pt will be able to ascend/descend stairs with normal  reciprocal gait Baseline:  Goal status: INITIAL  LONG TERM GOALS: Target date: 05/08/2023  Pt will be ind with progression and advancement of HEP Baseline:  Goal status: INITIAL  2.  Pt will have improved R knee ROM from 0 to 115 deg Baseline:  Goal status: INITIAL  3.  Pt will have improved 5x STS to </=15 sec without UE support to demo increased functional LE strength Baseline:  Goal status: INITIAL  4.  Pt will be able to amb >/=1000' ind for community mobility Baseline:  Goal status: INITIAL  5.  Pt will have improved LEFS to at least 61% to demo MCID Baseline:  Goal status: INITIAL   PLAN:  PT FREQUENCY: 2x/week  PT DURATION: 8 weeks  PLANNED INTERVENTIONS: Therapeutic exercises, Therapeutic activity, Neuromuscular re-education, Balance training, Gait training, Patient/Family education, Self Care, Joint mobilization, Stair training, Aquatic Therapy, Dry Needling, Electrical stimulation, Cryotherapy, Moist heat, scar mobilization, Taping, Vasopneumatic device, Ionotophoresis 4mg /ml Dexamethasone, Manual therapy, and Re-evaluation  PLAN FOR NEXT SESSION: Continue gross LE strengthening. Work on knee ROM as able.    Sanjuana Mae, PTA 03/15/2023, 4:18 PM

## 2023-03-20 ENCOUNTER — Encounter: Payer: Self-pay | Admitting: Physical Therapy

## 2023-03-20 ENCOUNTER — Ambulatory Visit: Payer: Medicare HMO | Admitting: Physical Therapy

## 2023-03-20 DIAGNOSIS — R600 Localized edema: Secondary | ICD-10-CM

## 2023-03-20 DIAGNOSIS — M6281 Muscle weakness (generalized): Secondary | ICD-10-CM | POA: Diagnosis not present

## 2023-03-20 DIAGNOSIS — G8929 Other chronic pain: Secondary | ICD-10-CM | POA: Diagnosis not present

## 2023-03-20 DIAGNOSIS — M25561 Pain in right knee: Secondary | ICD-10-CM | POA: Diagnosis not present

## 2023-03-20 DIAGNOSIS — M25661 Stiffness of right knee, not elsewhere classified: Secondary | ICD-10-CM | POA: Diagnosis not present

## 2023-03-20 DIAGNOSIS — R262 Difficulty in walking, not elsewhere classified: Secondary | ICD-10-CM | POA: Diagnosis not present

## 2023-03-20 NOTE — Therapy (Signed)
OUTPATIENT PHYSICAL THERAPY LOWER EXTREMITY TREATMENT   Patient Name: Alicia Herrera MRN: [id number] DOB:Dec 15, 1945, 77 y.o., female Today's Date: 03/20/2023  END OF SESSION:  PT End of Session - 03/20/23 1148     Visit Number 3    Number of Visits 16    Date for PT Re-Evaluation 05/08/23    Authorization Type [employer] -- auth request 03/13/23    Authorization Time Period 12 visits approved for PT 03/13/2023-05/08/2023    Authorization - Number of Visits 12    Progress Note Due on Visit 10    PT Start Time 1145    PT Stop Time 1230    PT Time Calculation (min) 45 min    Activity Tolerance Patient tolerated treatment well    Behavior During Therapy WFL for tasks assessed/performed              Past Medical History:  Diagnosis Date   Arthritis    Coronary artery disease    Heart murmur    Hx of CABG    Hypertension    Hypothyroidism    hx of 25 years ago per pt   Rectocele    Uterine prolapse    Past Surgical History:  Procedure Laterality Date   CARDIAC VALVE REPLACEMENT     AVR   CORONARY ARTERY BYPASS GRAFT     prolapsed uterus surgery      TOTAL KNEE ARTHROPLASTY Right 02/19/2023   Procedure: TOTAL KNEE ARTHROPLASTY;  Surgeon: Jodi Geralds, MD;  Location: [facility];  Service: Orthopedics;  Laterality: Right;   Patient Active Problem List   Diagnosis Date Noted   Low back pain 03/06/2023   Pulmonary embolism with acute cor pulmonale (HCC) 02/27/2023   Primary osteoarthritis of right knee 02/19/2023   Status post total knee replacement, right 02/19/2023   Cyst of skin 11/18/2021   Primary osteoarthritis of both knees 05/18/2020   Uterine prolapse 03/26/2019   Rectocele 03/26/2019   Postmenopausal bleeding 03/26/2019   OAB (overactive bladder) 03/26/2019   Midline cystocele 03/26/2019   S/P CABG (coronary artery bypass graft) 11/10/2013   Essential hypertension 11/10/2013   S/P ascending aortic aneurysm repair 10/10/2013   Hyperlipidemia 08/15/2013     PCP: [name] DO  REFERRING PROVIDER: Jodi Geralds, MD  REFERRING DIAG: [phone] (ICD-10-CM) - S/P R total knee arthroplasty  THERAPY DIAG:  Chronic pain of right knee  Stiffness of right knee, not elsewhere classified  Difficulty in walking, not elsewhere classified  Localized edema  Muscle weakness (generalized)  Rationale for Evaluation and Treatment: Rehabilitation  ONSET DATE: 02/19/23  SUBJECTIVE:   SUBJECTIVE STATEMENT: Pt states she has been off oxycodone and still getting night sweats. Pt reports R knee tightness. Pain notable with LAQ. Yellow TB has been better to use.   PERTINENT HISTORY: L knee OA  PAIN:  Are you having pain? Yes: NPRS scale: 1/10 Pain location: along incision Pain description: Soreness Aggravating factors: Compression stockings Relieving factors: Ice  PRECAUTIONS: None  WEIGHT BEARING RESTRICTIONS: No  FALLS:  Has patient fallen in last 6 months? 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  PLOF: Independent  PATIENT GOALS: Return to gardening, walking, and sewing  NEXT MD VISIT: 03/27/23  OBJECTIVE:  DIAGNOSTIC FINDINGS: Ultrasound 02/27/23 that demonstrated R LE DVT  PATIENT SURVEYS:  LEFS 51.2%  EDEMA:  Circumferential: 48.5 cm (R), 42.8 (L)  MUSCLE LENGTH: Hamstrings: Right 65 deg; Left 90 deg Thomas test: did not assess  POSTURE:  L knee in slight varus and knee flexion; R knee in slight knee flexion  PALPATION: Patellar mobility WNL on R; mildly hypomobile on L with medial/lateral movements TTP and tightness in R hamstring  LOWER EXTREMITY ROM:  A/P ROM Right eval Left eval Right 03/20/23  Hip flexion     Hip extension      Hip abduction     Hip adduction     Hip internal rotation     Hip external rotation     Knee flexion 105/110 110/110 115 PROM  Knee extension -10/-10 -5/-5 -5 PROM  Ankle dorsiflexion     Ankle plantarflexion     Ankle inversion     Ankle eversion      (Blank rows = not tested)  LOWER EXTREMITY MMT:  MMT Right eval Left eval  Hip flexion 4+ 3+  Hip extension 4- 4-  Hip abduction 3+ 3+  Hip adduction    Hip internal rotation    Hip external rotation    Knee flexion 5 5  Knee extension 4 5  Ankle dorsiflexion    Ankle plantarflexion    Ankle inversion    Ankle eversion     (Blank rows = not tested)  LOWER EXTREMITY SPECIAL TESTS:  Did not assess  FUNCTIONAL TESTS:  5 times sit to stand: 25.19 sec with UEs Tandem stance: 29.81 sec on R LE; 37.16 sec on L LE; SLS: 2 sec on R, 3 sec on L  GAIT: Distance walked: 100' Assistive device utilized: None Level of assistance: Complete Independence Comments: R knee slightly flexed throughout gait, decreased knee flexion  during swing and decreased knee extension during heel strike   TODAY'S TREATMENT:   [facility] Adult PT Treatment:                                                DATE: 03/20/23 Therapeutic Exercise: Nustep L4 x 5 min UEs/LEs Prolonged knee ext stretch x2 min Quad set x10 with foot on bolster SAQ 2x10 SLR 2x10 Heel slide x10 Supine hip flexor/quad stretch with strap x30 sec Seated hamstring curl yellow TB 2x10 Standing hip abd yellow TB 2x10 Standing hip ext yellow TB 2x10 Standing terminal knee ext yellow TB 2x10 Heel raise 2x10 Manual Therapy: IASTM, STM & TPR on quads Neuromuscular re-ed: Tandem stance 2x30 sec Alternating forward step tap 6" 2x10 Alternating side step tap 6" 2x10 Gait: X3 laps around gym focusing on maintaining hip ext during stance phase   [facility] Adult PT Treatment:                                                DATE: 03/15/2023 Therapeutic Exercise: Supine ITB stretch  2x30" AAROM heel slides 10x5" SAQ (green bolster) 10x5" LAQ no resistance x5 --> YTB x10 Seated HS curl YTB x10 STS --> RTB above knees Seated clamshell RTB --> R unilateral clam R SL step up (4" step) 2x10 Modalities: Vaso, 34 deg, mild compression x  DATE: 03/13/23  See HEP below  PATIENT EDUCATION:  Education details: Exam findings, POC, initial HEP Person educated: Patient Education method: Explanation, Demonstration, and Handouts Education comprehension: verbalized understanding, returned demonstration, and needs further education  HOME EXERCISE PROGRAM: Access Code: [email or web address] URL: [email or web address] Date: 03/13/2023 Prepared by: Vernon Prey April Kirstie Peri  Exercises - Seated Hamstring Stretch  - 1 x daily - 7 x weekly - 2 sets - 30 sec hold - Seated Knee Extension with Resistance  - 1 x daily - 7 x weekly - 2 sets - 10 reps - Seated Hamstring Curls with Resistance  - 1 x daily - 7 x weekly - 2 sets - 10 reps - Hip Abduction with Resistance Loop  - 1 x daily - 7 x weekly - 2 sets - 10 reps - Hip Extension with Resistance Loop  - 1 x daily - 7 x weekly - 2 sets - 10 reps - Quad Setting and Stretching  - 1 x daily - 7 x weekly - 2 sets - 10 reps - 3 sec hold  ASSESSMENT:  CLINICAL IMPRESSION: Pt with increased quad tightness. Improved after manual work and stretching. Continued to work on quad, hamstring and hip strengthening with good pt tolerance. Initiated balance exercises this session. Knee flexion is progressing well.    OBJECTIVE IMPAIRMENTS: Abnormal gait, decreased activity tolerance, decreased balance, decreased endurance, decreased mobility, difficulty walking, decreased ROM, decreased strength, hypomobility, increased edema, increased fascial restrictions, impaired flexibility, improper body mechanics, postural  dysfunction, and pain.    GOALS: Goals reviewed with patient? Yes  SHORT TERM GOALS: Target date: 04/10/2023  Pt will be ind with initial HEP Baseline: Goal status: INITIAL  2.  Pt will be able to ascend/descend stairs with normal reciprocal gait Baseline:  Goal status: INITIAL  LONG TERM GOALS: Target date: 05/08/2023  Pt will be ind with progression and advancement of HEP Baseline:  Goal status: INITIAL  2.  Pt will have improved R knee ROM from 0 to 115 deg Baseline:  Goal status: INITIAL  3.  Pt will have improved 5x STS to </=15 sec without UE support to demo increased functional LE strength Baseline:  Goal status: INITIAL  4.  Pt will be able to amb >/=1000' ind for community mobility Baseline:  Goal status: INITIAL  5.  Pt will have improved LEFS to at least 61% to demo MCID Baseline:  Goal status: INITIAL   PLAN:  PT FREQUENCY: 2x/week  PT DURATION: 8 weeks  PLANNED INTERVENTIONS: Therapeutic exercises, Therapeutic activity, Neuromuscular re-education, Balance training, Gait training, Patient/Family education, Self Care, Joint mobilization, Stair training, Aquatic Therapy, Dry Needling, Electrical stimulation, Cryotherapy, Moist heat, scar mobilization, Taping, Vasopneumatic device, Ionotophoresis 4mg /ml Dexamethasone, Manual therapy, and Re-evaluation  PLAN FOR NEXT SESSION: Continue gross LE strengthening. Work on knee ROM as able.    Lasya Vetter April Ma L Suprena Travaglini, PT 03/20/2023, 11:49 AM

## 2023-03-22 ENCOUNTER — Ambulatory Visit: Payer: Medicare HMO | Admitting: Physical Therapy

## 2023-03-22 ENCOUNTER — Encounter: Payer: Self-pay | Admitting: Physical Therapy

## 2023-03-22 DIAGNOSIS — R262 Difficulty in walking, not elsewhere classified: Secondary | ICD-10-CM

## 2023-03-22 DIAGNOSIS — G8929 Other chronic pain: Secondary | ICD-10-CM | POA: Diagnosis not present

## 2023-03-22 DIAGNOSIS — M25561 Pain in right knee: Secondary | ICD-10-CM | POA: Diagnosis not present

## 2023-03-22 DIAGNOSIS — R600 Localized edema: Secondary | ICD-10-CM | POA: Diagnosis not present

## 2023-03-22 DIAGNOSIS — M25661 Stiffness of right knee, not elsewhere classified: Secondary | ICD-10-CM

## 2023-03-22 DIAGNOSIS — M6281 Muscle weakness (generalized): Secondary | ICD-10-CM

## 2023-03-22 NOTE — Therapy (Signed)
OUTPATIENT PHYSICAL THERAPY LOWER EXTREMITY TREATMENT   Patient Name: Alicia Herrera MRN: [id number] DOB:1946/08/15, 77 y.o., female Today's Date: 03/22/2023  END OF SESSION:  PT End of Session - 03/22/23 1139     Visit Number 4    Number of Visits 16    Date for PT Re-Evaluation 05/08/23    Authorization Type [employer] -- auth request 03/13/23    Authorization Time Period 12 visits approved for PT 03/13/2023-05/08/2023    Authorization - Visit Number 4    Authorization - Number of Visits 12    Progress Note Due on Visit 10    PT Start Time 1140    PT Stop Time 1235    PT Time Calculation (min) 55 min    Activity Tolerance Patient tolerated treatment well    Behavior During Therapy WFL for tasks assessed/performed            Past Medical History:  Diagnosis Date   Arthritis    Coronary artery disease    Heart murmur    Hx of CABG    Hypertension    Hypothyroidism    hx of 25 years ago per pt   Rectocele    Uterine prolapse    Past Surgical History:  Procedure Laterality Date   CARDIAC VALVE REPLACEMENT     AVR   CORONARY ARTERY BYPASS GRAFT     prolapsed uterus surgery      TOTAL KNEE ARTHROPLASTY Right 02/19/2023   Procedure: TOTAL KNEE ARTHROPLASTY;  Surgeon: Jodi Geralds, MD;  Location: [facility];  Service: Orthopedics;  Laterality: Right;   Patient Active Problem List   Diagnosis Date Noted   Low back pain 03/06/2023   Pulmonary embolism with acute cor pulmonale (HCC) 02/27/2023   Primary osteoarthritis of right knee 02/19/2023   Status post total knee replacement, right 02/19/2023   Cyst of skin 11/18/2021   Primary osteoarthritis of both knees 05/18/2020   Uterine prolapse 03/26/2019   Rectocele 03/26/2019   Postmenopausal bleeding 03/26/2019   OAB (overactive bladder) 03/26/2019   Midline cystocele 03/26/2019   S/P CABG (coronary artery bypass graft) 11/10/2013   Essential hypertension 11/10/2013   S/P ascending aortic aneurysm repair 10/10/2013    Hyperlipidemia 08/15/2013    PCP: [name] DO  REFERRING PROVIDER: Jodi Geralds, MD  REFERRING DIAG: [phone] (ICD-10-CM) - S/P R total knee arthroplasty  THERAPY DIAG:  Chronic pain of right knee  Stiffness of right knee, not elsewhere classified  Difficulty in walking, not elsewhere classified  Localized edema  Muscle weakness (generalized)  Rationale for Evaluation and Treatment: Rehabilitation  ONSET DATE: 02/19/23  SUBJECTIVE:   SUBJECTIVE STATEMENT: Pt reports less pain and soreness but mostly stiffness. "I did some walking."   PERTINENT HISTORY: L knee OA  PAIN:  Are you having pain? Yes: NPRS scale: 1/10 Pain location: along incision Pain description: Soreness Aggravating factors: Compression stockings Relieving factors: Ice  PRECAUTIONS: None  WEIGHT BEARING RESTRICTIONS: No  FALLS:  Has patient fallen in last 6 months? 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  PLOF: Independent  PATIENT GOALS: Return to gardening, walking, and sewing  NEXT MD VISIT: 03/27/23  OBJECTIVE:   DIAGNOSTIC FINDINGS: Ultrasound 02/27/23 that demonstrated R LE  DVT  PATIENT SURVEYS:  LEFS 51.2%  EDEMA:  Circumferential: 48.5 cm (R), 42.8 (L)  MUSCLE LENGTH: Hamstrings: Right 65 deg; Left 90 deg Thomas test: did not assess  POSTURE:  L knee in slight varus and knee flexion; R knee in slight knee flexion  PALPATION: Patellar mobility WNL on R; mildly hypomobile on L with medial/lateral movements TTP and tightness in R hamstring  LOWER EXTREMITY ROM:  A/P ROM Right eval Left eval Right 03/20/23  Hip flexion     Hip extension     Hip abduction     Hip adduction     Hip  internal rotation     Hip external rotation     Knee flexion 105/110 110/110 115 PROM  Knee extension -10/-10 -5/-5 -5 PROM  Ankle dorsiflexion     Ankle plantarflexion     Ankle inversion     Ankle eversion      (Blank rows = not tested)  LOWER EXTREMITY MMT:  MMT Right eval Left eval  Hip flexion 4+ 3+  Hip extension 4- 4-  Hip abduction 3+ 3+  Hip adduction    Hip internal rotation    Hip external rotation    Knee flexion 5 5  Knee extension 4 5  Ankle dorsiflexion    Ankle plantarflexion    Ankle inversion    Ankle eversion     (Blank rows = not tested)  LOWER EXTREMITY SPECIAL TESTS:  Did not assess  FUNCTIONAL TESTS:  5 times sit to stand: 25.19 sec with UEs Tandem stance: 29.81 sec on R LE; 37.16 sec on L LE; SLS: 2 sec on R, 3 sec on L  GAIT: Distance walked: 100' Assistive device utilized: None Level of assistance: Complete Independence Comments: R knee slightly flexed throughout gait, decreased knee flexion  during swing and decreased knee extension during heel strike   TODAY'S TREATMENT:   [facility] Adult PT Treatment:                                                DATE: 03/22/23 Therapeutic Exercise: Nustep L5 x 5 min UEs/LEs Heel slide 2x10x5 sec Bridge 2x10 Hamstring stretch with strap 2x30 sec Prolonged knee ext stretch foot on bolster x2 min Quad set 2x10x3 sec Standing terminal knee extension against ball 2x10 Gastroc stretch slant board x30 sec Soleus stretch slant board x30 sec Standing hip abd yellow TB 2x10 Standing hip ext yellow TB 2x10 Standing hamstring curl yellow TB 2x10 Gait: Fwds amb x2 laps, heel strike and hip ext Bwds amb 2x20', cues for toe strike and knee ext Side stepping 2x20' cues to keep feet facing fwd Modalities: Vaso, 34 deg, mild compression x   [facility] Adult PT Treatment:                                                DATE: 03/20/23 Therapeutic Exercise: Nustep L4 x 5 min UEs/LEs Prolonged knee ext stretch x2  min Quad set x10 with foot on bolster SAQ 2x10 SLR 2x10 Heel slide x10 Supine hip flexor/quad stretch with strap x30 sec Seated hamstring curl yellow TB 2x10 Standing hip abd yellow TB 2x10 Standing hip ext yellow TB 2x10 Standing terminal knee  ext yellow TB 2x10 Heel raise 2x10 Manual Therapy: IASTM, STM & TPR on quads Neuromuscular re-ed: Tandem stance 2x30 sec Alternating forward step tap 6" 2x10 Alternating side step tap 6" 2x10 Gait: X3 laps around gym focusing on maintaining hip ext during stance phase   [facility] Adult PT Treatment:                                                DATE: 03/15/2023 Therapeutic Exercise: Supine ITB stretch 2x30" AAROM heel slides 10x5" SAQ (green bolster) 10x5" LAQ no resistance x5 --> YTB x10 Seated HS curl YTB x10 STS --> RTB above knees Seated clamshell RTB --> R unilateral clam R SL step up (4" step) 2x10 Modalities: Vaso, 34 deg, mild compression x                                                                                                                              DATE: 03/13/23  See HEP below  PATIENT EDUCATION:  Education details: Exam findings, POC, initial HEP Person educated: Patient Education method: Explanation, Demonstration, and Handouts Education comprehension: verbalized understanding, returned demonstration, and needs further education  HOME EXERCISE PROGRAM: Access Code: [email or web address] URL: [email or web address] Date: 03/13/2023 Prepared by: Vernon Prey April Kirstie Peri  Exercises - Seated Hamstring Stretch  - 1 x daily - 7 x weekly - 2 sets - 30 sec hold - Seated Knee Extension with Resistance  - 1 x daily - 7 x weekly - 2 sets - 10 reps - Seated Hamstring Curls with Resistance  - 1 x daily - 7 x weekly - 2 sets - 10 reps - Hip Abduction with Resistance Loop  - 1 x daily - 7 x weekly - 2 sets - 10 reps - Hip Extension with Resistance Loop  - 1 x daily - 7 x weekly - 2 sets - 10 reps - Quad  Setting and Stretching  - 1 x daily - 7 x weekly - 2 sets - 10 reps - 3 sec hold  ASSESSMENT:  CLINICAL IMPRESSION: Focused on improving terminal knee extension this session as well as progressing pt's standing exercise. Became a little SHOB while working on multidirectional gait. Will trial red TB and/or leg press next session. Pt reports no issues with 4" step -- may attempt 6" step training.    OBJECTIVE IMPAIRMENTS: Abnormal gait, decreased activity tolerance, decreased balance, decreased endurance, decreased mobility, difficulty walking, decreased ROM, decreased strength, hypomobility, increased edema, increased fascial restrictions, impaired flexibility, improper body mechanics, postural dysfunction, and pain.    GOALS: Goals reviewed with patient? Yes  SHORT TERM GOALS: Target date: 04/10/2023  Pt will be ind with initial HEP Baseline: Goal status: INITIAL  2.  Pt will be able to ascend/descend stairs with normal reciprocal gait Baseline:  Goal status: INITIAL  LONG  TERM GOALS: Target date: 05/08/2023  Pt will be ind with progression and advancement of HEP Baseline:  Goal status: INITIAL  2.  Pt will have improved R knee ROM from 0 to 115 deg Baseline:  Goal status: INITIAL  3.  Pt will have improved 5x STS to </=15 sec without UE support to demo increased functional LE strength Baseline:  Goal status: INITIAL  4.  Pt will be able to amb >/=1000' ind for community mobility Baseline:  Goal status: INITIAL  5.  Pt will have improved LEFS to at least 61% to demo MCID Baseline:  Goal status: INITIAL   PLAN:  PT FREQUENCY: 2x/week  PT DURATION: 8 weeks  PLANNED INTERVENTIONS: Therapeutic exercises, Therapeutic activity, Neuromuscular re-education, Balance training, Gait training, Patient/Family education, Self Care, Joint mobilization, Stair training, Aquatic Therapy, Dry Needling, Electrical stimulation, Cryotherapy, Moist heat, scar mobilization, Taping,  Vasopneumatic device, Ionotophoresis 4mg /ml Dexamethasone, Manual therapy, and Re-evaluation  PLAN FOR NEXT SESSION: Continue gross LE strengthening. Work on knee ROM as able. Consider using leg press and transition to red TB.    Sreeja Spies April Ma L Dariel Pellecchia, PT 03/22/2023, 11:39 AM

## 2023-03-27 ENCOUNTER — Encounter: Payer: Self-pay | Admitting: Physical Therapy

## 2023-03-27 ENCOUNTER — Ambulatory Visit: Payer: Medicare HMO | Admitting: Physical Therapy

## 2023-03-27 DIAGNOSIS — M6281 Muscle weakness (generalized): Secondary | ICD-10-CM | POA: Diagnosis not present

## 2023-03-27 DIAGNOSIS — R600 Localized edema: Secondary | ICD-10-CM | POA: Diagnosis not present

## 2023-03-27 DIAGNOSIS — M25661 Stiffness of right knee, not elsewhere classified: Secondary | ICD-10-CM | POA: Diagnosis not present

## 2023-03-27 DIAGNOSIS — G8929 Other chronic pain: Secondary | ICD-10-CM | POA: Diagnosis not present

## 2023-03-27 DIAGNOSIS — R262 Difficulty in walking, not elsewhere classified: Secondary | ICD-10-CM

## 2023-03-27 DIAGNOSIS — M25561 Pain in right knee: Secondary | ICD-10-CM | POA: Diagnosis not present

## 2023-03-27 NOTE — Therapy (Signed)
OUTPATIENT PHYSICAL THERAPY LOWER EXTREMITY TREATMENT   Patient Name: Alicia Herrera MRN: [id number] DOB:1946/04/20, 77 y.o., female Today's Date: 03/27/2023  END OF SESSION:  PT End of Session - 03/27/23 1145     Visit Number 5    Number of Visits 16    Date for PT Re-Evaluation 05/08/23    Authorization Type [employer] -- auth request 03/13/23    Authorization Time Period 12 visits approved for PT 03/13/2023-05/08/2023    Authorization - Visit Number 5    Authorization - Number of Visits 12    Progress Note Due on Visit 10    PT Start Time 1146    PT Stop Time 1230    PT Time Calculation (min) 44 min    Activity Tolerance Patient tolerated treatment well    Behavior During Therapy WFL for tasks assessed/performed            Past Medical History:  Diagnosis Date   Arthritis    Coronary artery disease    Heart murmur    Hx of CABG    Hypertension    Hypothyroidism    hx of 25 years ago per pt   Rectocele    Uterine prolapse    Past Surgical History:  Procedure Laterality Date   CARDIAC VALVE REPLACEMENT     AVR   CORONARY ARTERY BYPASS GRAFT     prolapsed uterus surgery      TOTAL KNEE ARTHROPLASTY Right 02/19/2023   Procedure: TOTAL KNEE ARTHROPLASTY;  Surgeon: Jodi Geralds, MD;  Location: [facility];  Service: Orthopedics;  Laterality: Right;   Patient Active Problem List   Diagnosis Date Noted   Low back pain 03/06/2023   Pulmonary embolism with acute cor pulmonale (HCC) 02/27/2023   Primary osteoarthritis of right knee 02/19/2023   Status post total knee replacement, right 02/19/2023   Cyst of skin 11/18/2021   Primary osteoarthritis of both knees 05/18/2020   Uterine prolapse 03/26/2019   Rectocele 03/26/2019   Postmenopausal bleeding 03/26/2019   OAB (overactive bladder) 03/26/2019   Midline cystocele 03/26/2019   S/P CABG (coronary artery bypass graft) 11/10/2013   Essential hypertension 11/10/2013   S/P ascending aortic aneurysm repair 10/10/2013    Hyperlipidemia 08/15/2013    PCP: [name] DO  REFERRING PROVIDER: Jodi Geralds, MD  REFERRING DIAG: [phone] (ICD-10-CM) - S/P R total knee arthroplasty  THERAPY DIAG:  Chronic pain of right knee  Stiffness of right knee, not elsewhere classified  Difficulty in walking, not elsewhere classified  Localized edema  Muscle weakness (generalized)  Rationale for Evaluation and Treatment: Rehabilitation  ONSET DATE: 02/19/23  SUBJECTIVE:   SUBJECTIVE STATEMENT: Pt states she will see ortho this afternoon. Was concerned that her knee was a little red for possible infection but notes no fever. She also has had some increased sensitivity on her knee. She does state the bandages have been removed. Has had some increased anterior medial knee pain with walking on Sunday. It feels just sore today but no sharp pain.  PERTINENT HISTORY: L knee OA  PAIN:  Are you having pain? Yes: NPRS scale: 1/10 Pain location: along incision Pain description: Soreness Aggravating factors: Compression stockings Relieving factors: Ice  PRECAUTIONS: None  WEIGHT BEARING RESTRICTIONS: No  FALLS:  Has patient fallen in last 6 months? 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  PLOF: Independent  PATIENT GOALS: Return to gardening, walking, and sewing  NEXT MD VISIT: 03/27/23  OBJECTIVE:   DIAGNOSTIC FINDINGS: Ultrasound 02/27/23 that demonstrated R LE DVT  PATIENT SURVEYS:  LEFS 51.2%  EDEMA:  Circumferential: 48.5 cm (R), 42.8 (L)  MUSCLE LENGTH: Hamstrings: Right 65 deg; Left 90 deg Thomas test: did not assess  POSTURE:  L knee in slight varus and knee flexion; R knee in slight knee  flexion  PALPATION: Patellar mobility WNL on R; mildly hypomobile on L with medial/lateral movements TTP and tightness in R hamstring  LOWER EXTREMITY ROM:  A/P ROM Right eval Left eval Right 03/20/23  Hip flexion     Hip extension     Hip abduction     Hip adduction     Hip internal rotation     Hip external rotation     Knee flexion 105/110 110/110 115 PROM  Knee extension -10/-10 -5/-5 -5 PROM  Ankle dorsiflexion     Ankle plantarflexion     Ankle inversion     Ankle eversion      (Blank rows = not tested)  LOWER EXTREMITY MMT:  MMT Right eval Left eval  Hip flexion 4+ 3+  Hip extension 4- 4-  Hip abduction 3+ 3+  Hip adduction    Hip internal rotation    Hip external rotation    Knee flexion 5 5  Knee extension 4 5  Ankle dorsiflexion    Ankle plantarflexion    Ankle inversion    Ankle eversion     (Blank rows = not tested)  LOWER EXTREMITY SPECIAL TESTS:  Did not assess  FUNCTIONAL TESTS:  5 times sit to stand: 25.19 sec with UEs Tandem stance: 29.81 sec on R LE; 37.16 sec on L LE; SLS: 2 sec on R, 3 sec on L  GAIT: Distance walked: 100' Assistive device utilized: None Level of assistance: Complete Independence Comments: R knee slightly flexed throughout gait, decreased knee flexion  during swing and decreased knee extension during heel strike   TODAY'S TREATMENT:   [facility] Adult PT Treatment:                                                DATE: 03/27/23 Therapeutic Exercise: Nustep L5 x 5 min UEs/LEs Gastroc stretch on step x30 sec Soleus stretch on step x30 sec Seated adductor stretch x 30 sec Seated hamstring stretch 2 x 30 sec Seated quad set 2x10 Seated knee flexion self stretch 3x10 sec Seated heel/toe raise 2x10 Neuromuscular re-ed: Alternating forward step tap 6" 2x10 Alternating side step tap 6" 2x10 Tandem stance 2x30 sec Modalities: Vaso, 40 deg, no compression x Self Care: Discussed knee position with foot  positioning   [facility] Adult PT Treatment:                                                DATE: 03/22/23 Therapeutic Exercise: Nustep L5 x 5 min UEs/LEs Heel slide 2x10x5 sec Bridge 2x10 Hamstring stretch with strap 2x30 sec Prolonged knee ext stretch foot on bolster x2 min Quad set 2x10x3 sec  Standing terminal knee extension against ball 2x10 Gastroc stretch slant board x30 sec Soleus stretch slant board x30 sec Standing hip abd yellow TB 2x10 Standing hip ext yellow TB 2x10 Standing hamstring curl yellow TB 2x10 Gait: Fwds amb x2 laps, heel strike and hip ext Bwds amb 2x20', cues for toe strike and knee ext Side stepping 2x20' cues to keep feet facing fwd Modalities: Vaso, 34 deg, mild compression x   [facility] Adult PT Treatment:                                                DATE: 03/20/23 Therapeutic Exercise: Nustep L4 x 5 min UEs/LEs Prolonged knee ext stretch x2 min Quad set x10 with foot on bolster SAQ 2x10 SLR 2x10 Heel slide x10 Supine hip flexor/quad stretch with strap x30 sec Seated hamstring curl yellow TB 2x10 Standing hip abd yellow TB 2x10 Standing hip ext yellow TB 2x10 Standing terminal knee ext yellow TB 2x10 Heel raise 2x10 Manual Therapy: IASTM, STM & TPR on quads Neuromuscular re-ed: Tandem stance 2x30 sec Alternating forward step tap 6" 2x10 Alternating side step tap 6" 2x10 Gait: X3 laps around gym focusing on maintaining hip ext during stance phase   PATIENT EDUCATION:  Education details: Exam findings, POC, initial HEP Person educated: Patient Education method: Explanation, Demonstration, and Handouts Education comprehension: verbalized understanding, returned demonstration, and needs further education  HOME EXERCISE PROGRAM: Access Code: [email or web address] URL: [email or web address] Date: 03/13/2023 Prepared by: Vernon Prey April Kirstie Peri  Exercises - Seated Hamstring Stretch  - 1 x daily - 7 x weekly - 2 sets - 30 sec hold -  Seated Knee Extension with Resistance  - 1 x daily - 7 x weekly - 2 sets - 10 reps - Seated Hamstring Curls with Resistance  - 1 x daily - 7 x weekly - 2 sets - 10 reps - Hip Abduction with Resistance Loop  - 1 x daily - 7 x weekly - 2 sets - 10 reps - Hip Extension with Resistance Loop  - 1 x daily - 7 x weekly - 2 sets - 10 reps - Quad Setting and Stretching  - 1 x daily - 7 x weekly - 2 sets - 10 reps - 3 sec hold  ASSESSMENT:  CLINICAL IMPRESSION: Some increased knee pain/soreness today. Discussed with pt how to decrease medial/anterior knee pain with stretches and watching her knee alignment. Continued to work on R LE stability and balance this session. Will attempt to progress her strengthening next session. Pt to try and use red TB at home.    OBJECTIVE IMPAIRMENTS: Abnormal gait, decreased activity tolerance, decreased balance, decreased endurance, decreased mobility, difficulty walking, decreased ROM, decreased strength, hypomobility, increased edema, increased fascial restrictions, impaired flexibility, improper body mechanics, postural dysfunction, and pain.    GOALS: Goals reviewed with patient? Yes  SHORT TERM GOALS: Target date: 04/10/2023  Pt will be ind with initial HEP Baseline: Goal status: INITIAL  2.  Pt will be able to ascend/descend stairs with normal reciprocal gait Baseline:  Goal status: INITIAL  LONG TERM GOALS: Target date: 05/08/2023  Pt will be ind with progression and advancement of HEP Baseline:  Goal status: INITIAL  2.  Pt will have improved R knee ROM from 0 to 115 deg Baseline:  Goal status: INITIAL  3.  Pt will have improved 5x  STS to </=15 sec without UE support to demo increased functional LE strength Baseline:  Goal status: INITIAL  4.  Pt will be able to amb >/=1000' ind for community mobility Baseline:  Goal status: INITIAL  5.  Pt will have improved LEFS to at least 61% to demo MCID Baseline:  Goal status:  INITIAL   PLAN:  PT FREQUENCY: 2x/week  PT DURATION: 8 weeks  PLANNED INTERVENTIONS: Therapeutic exercises, Therapeutic activity, Neuromuscular re-education, Balance training, Gait training, Patient/Family education, Self Care, Joint mobilization, Stair training, Aquatic Therapy, Dry Needling, Electrical stimulation, Cryotherapy, Moist heat, scar mobilization, Taping, Vasopneumatic device, Ionotophoresis 4mg /ml Dexamethasone, Manual therapy, and Re-evaluation  PLAN FOR NEXT SESSION: Continue gross LE strengthening. Work on knee ROM as able. Consider using leg press and transition to red TB.    Corie Allis April Ma L Gaylen Pereira, PT 03/27/2023, 11:46 AM

## 2023-03-29 ENCOUNTER — Encounter: Payer: Self-pay | Admitting: Physical Therapy

## 2023-03-29 ENCOUNTER — Ambulatory Visit: Payer: Medicare HMO | Admitting: Physical Therapy

## 2023-03-29 DIAGNOSIS — G8929 Other chronic pain: Secondary | ICD-10-CM | POA: Diagnosis not present

## 2023-03-29 DIAGNOSIS — R262 Difficulty in walking, not elsewhere classified: Secondary | ICD-10-CM

## 2023-03-29 DIAGNOSIS — R600 Localized edema: Secondary | ICD-10-CM

## 2023-03-29 DIAGNOSIS — M25661 Stiffness of right knee, not elsewhere classified: Secondary | ICD-10-CM | POA: Diagnosis not present

## 2023-03-29 DIAGNOSIS — M25561 Pain in right knee: Secondary | ICD-10-CM | POA: Diagnosis not present

## 2023-03-29 DIAGNOSIS — M6281 Muscle weakness (generalized): Secondary | ICD-10-CM

## 2023-03-29 NOTE — Therapy (Signed)
OUTPATIENT PHYSICAL THERAPY LOWER EXTREMITY TREATMENT   Patient Name: Alicia Herrera MRN: [id number] DOB:February 06, 1946, 77 y.o., female Today's Date: 03/29/2023  END OF SESSION:  PT End of Session - 03/29/23 1145     Visit Number 6    Number of Visits 16    Date for PT Re-Evaluation 05/08/23    Authorization Type [employer] -- auth request 03/13/23    Authorization Time Period 12 visits approved for PT 03/13/2023-05/08/2023    Authorization - Visit Number 6    Authorization - Number of Visits 12    Progress Note Due on Visit 10    PT Start Time 1145    PT Stop Time 1230    PT Time Calculation (min) 45 min    Activity Tolerance Patient tolerated treatment well    Behavior During Therapy WFL for tasks assessed/performed            Past Medical History:  Diagnosis Date   Arthritis    Coronary artery disease    Heart murmur    Hx of CABG    Hypertension    Hypothyroidism    hx of 25 years ago per pt   Rectocele    Uterine prolapse    Past Surgical History:  Procedure Laterality Date   CARDIAC VALVE REPLACEMENT     AVR   CORONARY ARTERY BYPASS GRAFT     prolapsed uterus surgery      TOTAL KNEE ARTHROPLASTY Right 02/19/2023   Procedure: TOTAL KNEE ARTHROPLASTY;  Surgeon: Jodi Geralds, MD;  Location: [facility];  Service: Orthopedics;  Laterality: Right;   Patient Active Problem List   Diagnosis Date Noted   Low back pain 03/06/2023   Pulmonary embolism with acute cor pulmonale (HCC) 02/27/2023   Primary osteoarthritis of right knee 02/19/2023   Status post total knee replacement, right 02/19/2023   Cyst of skin 11/18/2021   Primary osteoarthritis of both knees 05/18/2020   Uterine prolapse 03/26/2019   Rectocele 03/26/2019   Postmenopausal bleeding 03/26/2019   OAB (overactive bladder) 03/26/2019   Midline cystocele 03/26/2019   S/P CABG (coronary artery bypass graft) 11/10/2013   Essential hypertension 11/10/2013   S/P ascending aortic aneurysm repair 10/10/2013    Hyperlipidemia 08/15/2013    PCP: [name] DO  REFERRING PROVIDER: Jodi Geralds, MD  REFERRING DIAG: [phone] (ICD-10-CM) - S/P R total knee arthroplasty  THERAPY DIAG:  Chronic pain of right knee  Stiffness of right knee, not elsewhere classified  Difficulty in walking, not elsewhere classified  Localized edema  Muscle weakness (generalized)  Rationale for Evaluation and Treatment: Rehabilitation  ONSET DATE: 02/19/23  SUBJECTIVE:   SUBJECTIVE STATEMENT: Pt saw ortho who gave her antibiotics. She notes the redness has decreased since starting the antibiotics. Has been feeling better. Was able to get better sleep.   PERTINENT HISTORY: L knee OA  PAIN:  Are you having pain? Yes: NPRS scale: 1/10 Pain location: along incision Pain description: Soreness Aggravating factors: Compression stockings Relieving factors: Ice  PRECAUTIONS: None  WEIGHT BEARING RESTRICTIONS: No  FALLS:  Has patient fallen in last 6 months? 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  PLOF: Independent  PATIENT GOALS: Return to gardening, walking, and sewing  NEXT MD  VISIT: 03/27/23  OBJECTIVE:   DIAGNOSTIC FINDINGS: Ultrasound 02/27/23 that demonstrated R LE DVT  PATIENT SURVEYS:  LEFS 51.2%  EDEMA:  Circumferential: 48.5 cm (R), 42.8 (L)  MUSCLE LENGTH: Hamstrings: Right 65 deg; Left 90 deg Thomas test: did not assess  POSTURE:  L knee in slight varus and knee flexion; R knee in slight knee flexion  PALPATION: Patellar mobility WNL on R; mildly hypomobile on L with medial/lateral movements TTP and tightness in R hamstring  LOWER EXTREMITY ROM:  A/P ROM Right eval Left eval  Right 03/20/23  Hip flexion     Hip extension     Hip abduction     Hip adduction     Hip internal rotation     Hip external rotation     Knee flexion 105/110 110/110 115 PROM  Knee extension -10/-10 -5/-5 -5 PROM  Ankle dorsiflexion     Ankle plantarflexion     Ankle inversion     Ankle eversion      (Blank rows = not tested)  LOWER EXTREMITY MMT:  MMT Right eval Left eval  Hip flexion 4+ 3+  Hip extension 4- 4-  Hip abduction 3+ 3+  Hip adduction    Hip internal rotation    Hip external rotation    Knee flexion 5 5  Knee extension 4 5  Ankle dorsiflexion    Ankle plantarflexion    Ankle inversion    Ankle eversion     (Blank rows = not tested)  LOWER EXTREMITY SPECIAL TESTS:  Did not assess  FUNCTIONAL TESTS:  5 times sit to stand: 25.19 sec with UEs Tandem stance: 29.81 sec on R LE; 37.16 sec on L LE; SLS: 2 sec on R, 3 sec on L  GAIT: Distance walked: 100' Assistive device utilized: None Level of assistance: Complete Independence Comments: R knee slightly flexed throughout gait, decreased knee flexion  during swing and decreased knee extension during heel strike   TODAY'S TREATMENT:   [facility] Adult PT Treatment:                                                DATE: 03/29/23 Therapeutic Exercise: Recumbent bike L1 x 5 min Gastroc stretch on step x30 sec Soleus stretch on step x30 sec Standing hip abd red TB 2x10 Standing hip ext red TB 2x10 Standing hip flex red TB 2x10 Standing hamstring curl red TB 2x10 Sit<>stand red TB around knees 2x10 Heel/toe raises 2x10 Terminal knee ext red TB 2x10 DL Leg press 16# [id number] Gait: X3 laps around gym working on knee ext/hip ext Modalities: Vaso, 34 deg, no compression x    [facility] Adult PT Treatment:                                                DATE: 03/27/23 Therapeutic Exercise: Nustep L5 x 5 min UEs/LEs Gastroc stretch on step x30 sec Soleus stretch on step x30 sec Seated adductor stretch x 30  sec Seated hamstring stretch 2 x 30 sec Seated quad set 2x10 Seated knee flexion self stretch 3x10 sec Seated heel/toe raise 2x10 Neuromuscular re-ed: Alternating forward step tap 6" 2x10 Alternating side step tap 6" 2x10 Tandem stance 2x30 sec Modalities:  Vaso, 40 deg, no compression x Self Care: Discussed knee position with foot positioning   [facility] Adult PT Treatment:                                                DATE: 03/22/23 Therapeutic Exercise: Nustep L5 x 5 min UEs/LEs Heel slide 2x10x5 sec Bridge 2x10 Hamstring stretch with strap 2x30 sec Prolonged knee ext stretch foot on bolster x2 min Quad set 2x10x3 sec Standing terminal knee extension against ball 2x10 Gastroc stretch slant board x30 sec Soleus stretch slant board x30 sec Standing hip abd yellow TB 2x10 Standing hip ext yellow TB 2x10 Standing hamstring curl yellow TB 2x10 Gait: Fwds amb x2 laps, heel strike and hip ext Bwds amb 2x20', cues for toe strike and knee ext Side stepping 2x20' cues to keep feet facing fwd Modalities: Vaso, 34 deg, mild compression x    PATIENT EDUCATION:  Education details: Exam findings, POC, initial HEP Person educated: Patient Education method: Explanation, Demonstration, and Handouts Education comprehension: verbalized understanding, returned demonstration, and needs further education  HOME EXERCISE PROGRAM: Access Code: [email or web address] URL: [email or web address] Date: 03/13/2023 Prepared by: Vernon Prey April Kirstie Peri  Exercises - Seated Hamstring Stretch  - 1 x daily - 7 x weekly - 2 sets - 30 sec hold - Seated Knee Extension with Resistance  - 1 x daily - 7 x weekly - 2 sets - 10 reps - Seated Hamstring Curls with Resistance  - 1 x daily - 7 x weekly - 2 sets - 10 reps - Hip Abduction with Resistance Loop  - 1 x daily - 7 x weekly - 2 sets - 10 reps - Hip Extension with Resistance Loop  - 1 x daily - 7 x weekly - 2 sets - 10 reps - Quad Setting  and Stretching  - 1 x daily - 7 x weekly - 2 sets - 10 reps - 3 sec hold  ASSESSMENT:  CLINICAL IMPRESSION: Session focused on progressing her strengthening exercises and updated her HEP. Pt is demonstrating great knee ROM. Still working on strength and stability. Initiated some machine strengthening today. Tends to shift weight towards the left during sit<>stands. Less redness now that she has started her antibiotics.   OBJECTIVE IMPAIRMENTS: Abnormal gait, decreased activity tolerance, decreased balance, decreased endurance, decreased mobility, difficulty walking, decreased ROM, decreased strength, hypomobility, increased edema, increased fascial restrictions, impaired flexibility, improper body mechanics, postural dysfunction, and pain.    GOALS: Goals reviewed with patient? Yes  SHORT TERM GOALS: Target date: 04/10/2023  Pt will be ind with initial HEP Baseline: Goal status: INITIAL  2.  Pt will be able to ascend/descend stairs with normal reciprocal gait Baseline:  Goal status: INITIAL  LONG TERM GOALS: Target date: 05/08/2023  Pt will be ind with progression and advancement of HEP Baseline:  Goal status: INITIAL  2.  Pt will have improved R knee ROM from 0 to 115 deg Baseline:  Goal status: INITIAL  3.  Pt will have improved 5x STS to </=15 sec without UE support to demo increased functional LE strength Baseline:  Goal status: INITIAL  4.  Pt will be able to amb >/=1000' ind for community mobility Baseline:  Goal status: INITIAL  5.  Pt will have improved LEFS to at least 61% to demo MCID Baseline:  Goal status: INITIAL  PLAN:  PT FREQUENCY: 2x/week  PT DURATION: 8 weeks  PLANNED INTERVENTIONS: Therapeutic exercises, Therapeutic activity, Neuromuscular re-education, Balance training, Gait training, Patient/Family education, Self Care, Joint mobilization, Stair training, Aquatic Therapy, Dry Needling, Electrical stimulation, Cryotherapy, Moist heat, scar  mobilization, Taping, Vasopneumatic device, Ionotophoresis 4mg /ml Dexamethasone, Manual therapy, and Re-evaluation  PLAN FOR NEXT SESSION: Continue gross LE strengthening. Work on knee ROM as able. Leg press/machine strengthening. Stairs/step ups.    Bowen Kia April Ma L Kaleisha Bhargava, PT 03/29/2023, 12:25 PM

## 2023-04-03 ENCOUNTER — Ambulatory Visit: Payer: Medicare HMO

## 2023-04-03 DIAGNOSIS — R600 Localized edema: Secondary | ICD-10-CM | POA: Diagnosis not present

## 2023-04-03 DIAGNOSIS — R262 Difficulty in walking, not elsewhere classified: Secondary | ICD-10-CM

## 2023-04-03 DIAGNOSIS — M6281 Muscle weakness (generalized): Secondary | ICD-10-CM | POA: Diagnosis not present

## 2023-04-03 DIAGNOSIS — G8929 Other chronic pain: Secondary | ICD-10-CM

## 2023-04-03 DIAGNOSIS — M25561 Pain in right knee: Secondary | ICD-10-CM | POA: Diagnosis not present

## 2023-04-03 DIAGNOSIS — M25661 Stiffness of right knee, not elsewhere classified: Secondary | ICD-10-CM

## 2023-04-03 NOTE — Therapy (Signed)
OUTPATIENT PHYSICAL THERAPY LOWER EXTREMITY TREATMENT   Patient Name: Alicia Herrera MRN: [id number] DOB:08/16/1946, 77 y.o., female Today's Date: 04/03/2023  END OF SESSION:  PT End of Session - 04/03/23 1104     Visit Number 7    Number of Visits 16    Date for PT Re-Evaluation 05/08/23    Authorization Type [employer]    Authorization Time Period 12 visits approved for PT 03/13/2023-05/08/2023    Authorization - Visit Number 7    Authorization - Number of Visits 12    Progress Note Due on Visit 10    PT Start Time 1105    PT Stop Time 1155    PT Time Calculation (min) 50 min    Activity Tolerance Patient tolerated treatment well    Behavior During Therapy WFL for tasks assessed/performed            Past Medical History:  Diagnosis Date   Arthritis    Coronary artery disease    Heart murmur    Hx of CABG    Hypertension    Hypothyroidism    hx of 25 years ago per pt   Rectocele    Uterine prolapse    Past Surgical History:  Procedure Laterality Date   CARDIAC VALVE REPLACEMENT     AVR   CORONARY ARTERY BYPASS GRAFT     prolapsed uterus surgery      TOTAL KNEE ARTHROPLASTY Right 02/19/2023   Procedure: TOTAL KNEE ARTHROPLASTY;  Surgeon: Jodi Geralds, MD;  Location: [facility];  Service: Orthopedics;  Laterality: Right;   Patient Active Problem List   Diagnosis Date Noted   Low back pain 03/06/2023   Pulmonary embolism with acute cor pulmonale (HCC) 02/27/2023   Primary osteoarthritis of right knee 02/19/2023   Status post total knee replacement, right 02/19/2023   Cyst of skin 11/18/2021   Primary osteoarthritis of both knees 05/18/2020   Uterine prolapse 03/26/2019   Rectocele 03/26/2019   Postmenopausal bleeding 03/26/2019   OAB (overactive bladder) 03/26/2019   Midline cystocele 03/26/2019   S/P CABG (coronary artery bypass graft) 11/10/2013   Essential hypertension 11/10/2013   S/P ascending aortic aneurysm repair 10/10/2013   Hyperlipidemia  08/15/2013    PCP: [name] DO  REFERRING PROVIDER: Jodi Geralds, MD  REFERRING DIAG: [phone] (ICD-10-CM) - S/P R total knee arthroplasty  THERAPY DIAG:  Chronic pain of right knee  Stiffness of right knee, not elsewhere classified  Difficulty in walking, not elsewhere classified  Localized edema  Muscle weakness (generalized)  Rationale for Evaluation and Treatment: Rehabilitation  ONSET DATE: 02/19/23  SUBJECTIVE:   SUBJECTIVE STATEMENT: Patient reports she was very sore in R leg after last visit, states she thinks it was from the leg press, maybe weight was too high. Patient states she is feeling better today and is open to try leg press with lighter weight.    PERTINENT HISTORY: L knee OA  PAIN:  Are you having pain? Yes: NPRS scale: 1/10 Pain location: along incision Pain description: Soreness Aggravating factors: Compression stockings Relieving factors: Ice  PRECAUTIONS: None  WEIGHT BEARING RESTRICTIONS: No  FALLS:  Has patient fallen in last 6 months? 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  PLOF:  Independent  PATIENT GOALS: Return to gardening, walking, and sewing  NEXT MD VISIT: 04/24/23  OBJECTIVE:   DIAGNOSTIC FINDINGS: Ultrasound 02/27/23 that demonstrated R LE DVT  PATIENT SURVEYS:  LEFS 51.2%  EDEMA:  Circumferential: 48.5 cm (R), 42.8 (L)  MUSCLE LENGTH: Hamstrings: Right 65 deg; Left 90 deg Thomas test: did not assess  POSTURE:  L knee in slight varus and knee flexion; R knee in slight knee flexion  PALPATION: Patellar mobility WNL on R; mildly hypomobile on L with medial/lateral movements TTP and tightness in R hamstring  LOWER EXTREMITY  ROM:  A/P ROM Right eval Left eval Right 03/20/23  Hip flexion     Hip extension     Hip abduction     Hip adduction     Hip internal rotation     Hip external rotation     Knee flexion 105/110 110/110 115 PROM  Knee extension -10/-10 -5/-5 -5 PROM  Ankle dorsiflexion     Ankle plantarflexion     Ankle inversion     Ankle eversion      (Blank rows = not tested)  LOWER EXTREMITY MMT:  MMT Right eval Left eval  Hip flexion 4+ 3+  Hip extension 4- 4-  Hip abduction 3+ 3+  Hip adduction    Hip internal rotation    Hip external rotation    Knee flexion 5 5  Knee extension 4 5  Ankle dorsiflexion    Ankle plantarflexion    Ankle inversion    Ankle eversion     (Blank rows = not tested)  LOWER EXTREMITY SPECIAL TESTS:  Did not assess  FUNCTIONAL TESTS:  5 times sit to stand: 25.19 sec with UEs Tandem stance: 29.81 sec on R LE; 37.16 sec on L LE; SLS: 2 sec on R, 3 sec on L  GAIT: Distance walked: 100' Assistive device utilized: None Level of assistance: Complete Independence Comments: R knee slightly flexed throughout gait, decreased knee flexion  during swing and decreased knee extension during heel strike   TODAY'S TREATMENT:   [facility] Adult PT Treatment:                                                DATE: 04/03/2023 Therapeutic Exercise: Recumbent bike L2 x 5 min Standing: TKE GTB 10x5" Heel/toe raises 2x15 Runner's lunge (R) Resisted 3-way hip RTB: abd & ext 2x12 each Marching RTB crossed at forefoot 2x10 HS curls RTB 2x12 Supine ITB stretch w/strap 3x30" Leg press: DL 16# [id number] (folded towel under L foot) 6" step step up/down Modalities: Vaso, 34 deg, no compression x 10 min     [facility] Adult PT Treatment:                                                DATE: 03/29/23 Therapeutic Exercise: Recumbent bike L1 x 5 min Gastroc stretch on step x30 sec Soleus stretch on step x30 sec Standing hip abd red TB 2x10 Standing hip ext red TB 2x10 Standing hip  flex red TB 2x10 Standing hamstring curl red TB 2x10 Sit<>stand red TB around knees 2x10 Heel/toe raises 2x10 Terminal knee ext red TB 2x10 DL Leg press 04# [id number] Gait: X3 laps around gym working  on knee ext/hip ext Modalities: Vaso, 34 deg, no compression x    [facility] Adult PT Treatment:                                                DATE: 03/27/23 Therapeutic Exercise: Nustep L5 x 5 min UEs/LEs Gastroc stretch on step x30 sec Soleus stretch on step x30 sec Seated adductor stretch x 30 sec Seated hamstring stretch 2 x 30 sec Seated quad set 2x10 Seated knee flexion self stretch 3x10 sec Seated heel/toe raise 2x10 Neuromuscular re-ed: Alternating forward step tap 6" 2x10 Alternating side step tap 6" 2x10 Tandem stance 2x30 sec Modalities: Vaso, 40 deg, no compression x Self Care: Discussed knee position with foot positioning    PATIENT EDUCATION:  Education details: Exam findings, POC, initial HEP Person educated: Patient Education method: Explanation, Demonstration, and Handouts Education comprehension: verbalized understanding, returned demonstration, and needs further education  HOME EXERCISE PROGRAM: Access Code: [email or web address] URL: [email or web address] Date: 03/13/2023 Prepared by: Vernon Prey April Kirstie Peri  Exercises - Seated Hamstring Stretch  - 1 x daily - 7 x weekly - 2 sets - 30 sec hold - Seated Knee Extension with Resistance  - 1 x daily - 7 x weekly - 2 sets - 10 reps - Seated Hamstring Curls with Resistance  - 1 x daily - 7 x weekly - 2 sets - 10 reps - Hip Abduction with Resistance Loop  - 1 x daily - 7 x weekly - 2 sets - 10 reps - Hip Extension with Resistance Loop  - 1 x daily - 7 x weekly - 2 sets - 10 reps - Quad Setting and Stretching  - 1 x daily - 7 x weekly - 2 sets - 10 reps - 3 sec hold  ASSESSMENT:  CLINICAL IMPRESSION: Hip and knee strengthening continued. Patient tolerated leg press well with less weight. Left hip  hiking noted during standing resisted marching.   OBJECTIVE IMPAIRMENTS: Abnormal gait, decreased activity tolerance, decreased balance, decreased endurance, decreased mobility, difficulty walking, decreased ROM, decreased strength, hypomobility, increased edema, increased fascial restrictions, impaired flexibility, improper body mechanics, postural dysfunction, and pain.    GOALS: Goals reviewed with patient? Yes  SHORT TERM GOALS: Target date: 04/10/2023  Pt will be ind with initial HEP Baseline: Goal status: INITIAL  2.  Pt will be able to ascend/descend stairs with normal reciprocal gait Baseline:  Goal status: INITIAL  LONG TERM GOALS: Target date: 05/08/2023  Pt will be ind with progression and advancement of HEP Baseline:  Goal status: INITIAL  2.  Pt will have improved R knee ROM from 0 to 115 deg Baseline:  Goal status: INITIAL  3.  Pt will have improved 5x STS to </=15 sec without UE support to demo increased functional LE strength Baseline:  Goal status: INITIAL  4.  Pt will be able to amb >/=1000' ind for community mobility Baseline:  Goal status: INITIAL  5.  Pt will have improved LEFS to at least 61% to demo MCID Baseline:  Goal status: INITIAL   PLAN:  PT FREQUENCY: 2x/week  PT DURATION: 8 weeks  PLANNED INTERVENTIONS: Therapeutic exercises, Therapeutic activity, Neuromuscular re-education, Balance training, Gait training, Patient/Family education, Self Care, Joint mobilization, Stair training, Aquatic Therapy, Dry Needling, Electrical stimulation, Cryotherapy, Moist heat, scar mobilization, Taping, Vasopneumatic device, Ionotophoresis 4mg /ml Dexamethasone, Manual therapy, and  Re-evaluation  PLAN FOR NEXT SESSION: Continue gross LE strengthening as tolerated . Work on knee ROM as able. Leg press/machine strengthening. Stairs/step ups.    Sanjuana Mae, PTA 04/03/2023, 11:59 AM

## 2023-04-05 ENCOUNTER — Ambulatory Visit: Payer: Medicare HMO | Admitting: Physical Therapy

## 2023-04-05 ENCOUNTER — Encounter: Payer: Self-pay | Admitting: Physical Therapy

## 2023-04-05 DIAGNOSIS — M25661 Stiffness of right knee, not elsewhere classified: Secondary | ICD-10-CM | POA: Diagnosis not present

## 2023-04-05 DIAGNOSIS — G8929 Other chronic pain: Secondary | ICD-10-CM | POA: Diagnosis not present

## 2023-04-05 DIAGNOSIS — R262 Difficulty in walking, not elsewhere classified: Secondary | ICD-10-CM | POA: Diagnosis not present

## 2023-04-05 DIAGNOSIS — R600 Localized edema: Secondary | ICD-10-CM

## 2023-04-05 DIAGNOSIS — M6281 Muscle weakness (generalized): Secondary | ICD-10-CM

## 2023-04-05 DIAGNOSIS — M25561 Pain in right knee: Secondary | ICD-10-CM | POA: Diagnosis not present

## 2023-04-05 NOTE — Therapy (Signed)
OUTPATIENT PHYSICAL THERAPY LOWER EXTREMITY TREATMENT   Patient Name: Alicia Herrera MRN: [id number] DOB:1946-10-12, 77 y.o., female Today's Date: 04/05/2023  END OF SESSION:  PT End of Session - 04/05/23 1152     Visit Number 8    Number of Visits 16    Date for PT Re-Evaluation 05/08/23    Authorization Type [employer]    Authorization Time Period 12 visits approved for PT 03/13/2023-05/08/2023    Authorization - Number of Visits 12    Progress Note Due on Visit 10    PT Start Time 1150    PT Stop Time 1230    PT Time Calculation (min) 40 min    Activity Tolerance Patient tolerated treatment well    Behavior During Therapy WFL for tasks assessed/performed            Past Medical History:  Diagnosis Date   Arthritis    Coronary artery disease    Heart murmur    Hx of CABG    Hypertension    Hypothyroidism    hx of 25 years ago per pt   Rectocele    Uterine prolapse    Past Surgical History:  Procedure Laterality Date   CARDIAC VALVE REPLACEMENT     AVR   CORONARY ARTERY BYPASS GRAFT     prolapsed uterus surgery      TOTAL KNEE ARTHROPLASTY Right 02/19/2023   Procedure: TOTAL KNEE ARTHROPLASTY;  Surgeon: Jodi Geralds, MD;  Location: [facility];  Service: Orthopedics;  Laterality: Right;   Patient Active Problem List   Diagnosis Date Noted   Low back pain 03/06/2023   Pulmonary embolism with acute cor pulmonale (HCC) 02/27/2023   Primary osteoarthritis of right knee 02/19/2023   Status post total knee replacement, right 02/19/2023   Cyst of skin 11/18/2021   Primary osteoarthritis of both knees 05/18/2020   Uterine prolapse 03/26/2019   Rectocele 03/26/2019   Postmenopausal bleeding 03/26/2019   OAB (overactive bladder) 03/26/2019   Midline cystocele 03/26/2019   S/P CABG (coronary artery bypass graft) 11/10/2013   Essential hypertension 11/10/2013   S/P ascending aortic aneurysm repair 10/10/2013   Hyperlipidemia 08/15/2013    PCP: [name]  DO  REFERRING PROVIDER: Jodi Geralds, MD  REFERRING DIAG: [phone] (ICD-10-CM) - S/P R total knee arthroplasty  THERAPY DIAG:  Chronic pain of right knee  Stiffness of right knee, not elsewhere classified  Difficulty in walking, not elsewhere classified  Localized edema  Muscle weakness (generalized)  Rationale for Evaluation and Treatment: Rehabilitation  ONSET DATE: 02/19/23  SUBJECTIVE:   SUBJECTIVE STATEMENT: Patient states she was able to go up/down her steps in a reciprocal pattern. Mostly feeling tightness in the back of the leg today.   PERTINENT HISTORY: L knee OA  PAIN:  Are you having pain? Yes: NPRS scale: 1/10 Pain location: along incision Pain description: Soreness Aggravating factors: Compression stockings Relieving factors: Ice  PRECAUTIONS: None  WEIGHT BEARING RESTRICTIONS: No  FALLS:  Has patient fallen in last 6 months? 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  PLOF: Independent  PATIENT GOALS: Return to gardening, walking, and sewing  NEXT MD VISIT: 04/24/23  OBJECTIVE:   DIAGNOSTIC FINDINGS: Ultrasound 02/27/23 that demonstrated R LE DVT  PATIENT SURVEYS:  LEFS 51.2%  EDEMA:  Circumferential: 48.5 cm (R), 42.8 (L)  MUSCLE LENGTH: Hamstrings: Right 65 deg; Left 90 deg Thomas test: did not assess  POSTURE:  L knee in slight varus and knee flexion; R knee in slight knee flexion  PALPATION: Patellar mobility WNL on R; mildly hypomobile on L with medial/lateral movements TTP and tightness in R hamstring  LOWER EXTREMITY ROM:  A/P ROM Right eval Left eval Right 03/20/23 Right 04/05/23  Hip flexion      Hip extension      Hip abduction      Hip  adduction      Hip internal rotation      Hip external rotation      Knee flexion 105/110 110/110 115 PROM 120 AROM  Knee extension -10/-10 -5/-5 -5 PROM 0 AROM  Ankle dorsiflexion      Ankle plantarflexion      Ankle inversion      Ankle eversion       (Blank rows = not tested)  LOWER EXTREMITY MMT:  MMT Right eval Left eval  Hip flexion 4+ 3+  Hip extension 4- 4-  Hip abduction 3+ 3+  Hip adduction    Hip internal rotation    Hip external rotation    Knee flexion 5 5  Knee extension 4 5  Ankle dorsiflexion    Ankle plantarflexion    Ankle inversion    Ankle eversion     (Blank rows = not tested)  LOWER EXTREMITY SPECIAL TESTS:  Did not assess  FUNCTIONAL TESTS:  5 times sit to stand: 25.19 sec with UEs Tandem stance: 29.81 sec on R LE; 37.16 sec on L LE; SLS: 2 sec on R, 3 sec on L  GAIT: Distance walked: 100' Assistive device utilized: None Level of assistance: Complete Independence Comments: R knee slightly flexed throughout gait, decreased knee flexion  during swing and decreased knee extension during heel strike   TODAY'S TREATMENT:   [facility] Adult PT Treatment:                                                DATE: 04/05/23 Therapeutic Exercise: Recumbent L3 x 5 min Seated hamstring stretch x 30 sec Supine hamstring stretch with strap x 30 sec Supine ITB stretch with strap x 30 sec Prone knee hang x2 min Prone knee hang with quad setting 2x10 Runner's step up 6" step x10 with 1 hand on rail, x10 without UE support Step down 4" step 2x10 with bilat UE support Standing on step gastroc stretch x 30 sec Standing on step soleus stretch x 30 sec SL heel raise on step 2x10 Neuromuscular re-ed: Standing on foam feet together, EO, head turns 2x30 sec, head nods 2x30 sec Slow march on foam 2x10  [facility] Adult PT Treatment:                                                DATE: 04/03/2023 Therapeutic Exercise: Recumbent bike L2 x 5 min Standing: TKE GTB  10x5" Heel/toe raises 2x15 Runner's lunge (R) Resisted 3-way hip RTB: abd & ext 2x12 each Marching RTB crossed at forefoot 2x10 HS curls RTB 2x12 Supine ITB stretch w/strap 3x30" Leg  press: DL 09# [id number] (folded towel under L foot) 6" step step up/down Modalities: Vaso, 34 deg, no compression x 10 min    [facility] Adult PT Treatment:                                                DATE: 03/29/23 Therapeutic Exercise: Recumbent bike L1 x 5 min Gastroc stretch on step x30 sec Soleus stretch on step x30 sec Standing hip abd red TB 2x10 Standing hip ext red TB 2x10 Standing hip flex red TB 2x10 Standing hamstring curl red TB 2x10 Sit<>stand red TB around knees 2x10 Heel/toe raises 2x10 Terminal knee ext red TB 2x10 DL Leg press 14# [id number] Gait: X3 laps around gym working on knee ext/hip ext Modalities: Vaso, 34 deg, no compression x    PATIENT EDUCATION:  Education details: Exam findings, POC, initial HEP Person educated: Patient Education method: Explanation, Demonstration, and Handouts Education comprehension: verbalized understanding, returned demonstration, and needs further education  HOME EXERCISE PROGRAM: Access Code: [email or web address] URL: [email or web address] Date: 03/13/2023 Prepared by: Vernon Prey April Kirstie Peri  Exercises - Seated Hamstring Stretch  - 1 x daily - 7 x weekly - 2 sets - 30 sec hold - Seated Knee Extension with Resistance  - 1 x daily - 7 x weekly - 2 sets - 10 reps - Seated Hamstring Curls with Resistance  - 1 x daily - 7 x weekly - 2 sets - 10 reps - Hip Abduction with Resistance Loop  - 1 x daily - 7 x weekly - 2 sets - 10 reps - Hip Extension with Resistance Loop  - 1 x daily - 7 x weekly - 2 sets - 10 reps - Quad Setting and Stretching  - 1 x daily - 7 x weekly - 2 sets - 10 reps - 3 sec hold  ASSESSMENT:  CLINICAL IMPRESSION: Pt has met ROM goals this session. Continued to work on LE strengthening and balance. Able to ascend stairs  well with less pain, difficulty with eccentric quad control during descent (requires UE support)   OBJECTIVE IMPAIRMENTS: Abnormal gait, decreased activity tolerance, decreased balance, decreased endurance, decreased mobility, difficulty walking, decreased ROM, decreased strength, hypomobility, increased edema, increased fascial restrictions, impaired flexibility, improper body mechanics, postural dysfunction, and pain.    GOALS: Goals reviewed with patient? Yes  SHORT TERM GOALS: Target date: 04/10/2023  Pt will be ind with initial HEP Baseline: Goal status: INITIAL  2.  Pt will be able to ascend/descend stairs with normal reciprocal gait Baseline:  Goal status: INITIAL  LONG TERM GOALS: Target date: 05/08/2023  Pt will be ind with progression and advancement of HEP Baseline:  Goal status: INITIAL  2.  Pt will have improved R knee ROM from 0 to 115 deg Baseline:  Goal status: MET 04/05/23  3.  Pt will have improved 5x STS to </=15 sec without UE support to demo increased functional LE strength Baseline:  Goal status: INITIAL  4.  Pt will be able to amb >/=1000' ind for community mobility Baseline:  Goal status: INITIAL  5.  Pt will have improved LEFS to at least 61% to demo MCID Baseline:  Goal status: INITIAL   PLAN:  PT FREQUENCY: 2x/week  PT DURATION: 8 weeks  PLANNED INTERVENTIONS: Therapeutic exercises, Therapeutic activity, Neuromuscular re-education, Balance training, Gait training, Patient/Family education, Self Care, Joint  mobilization, Stair training, Aquatic Therapy, Dry Needling, Electrical stimulation, Cryotherapy, Moist heat, scar mobilization, Taping, Vasopneumatic device, Ionotophoresis 4mg /ml Dexamethasone, Manual therapy, and Re-evaluation  PLAN FOR NEXT SESSION: Continue gross LE strengthening as tolerated . Work on knee ROM as able. Leg press/machine strengthening. Stairs/step ups and downs    Yanissa Michalsky April Ma L [place], PT 04/05/2023, 11:52 AM

## 2023-04-10 ENCOUNTER — Ambulatory Visit: Payer: Medicare HMO | Admitting: Physical Therapy

## 2023-04-10 ENCOUNTER — Encounter: Payer: Self-pay | Admitting: Physical Therapy

## 2023-04-10 DIAGNOSIS — G8929 Other chronic pain: Secondary | ICD-10-CM

## 2023-04-10 DIAGNOSIS — M25561 Pain in right knee: Secondary | ICD-10-CM | POA: Diagnosis not present

## 2023-04-10 DIAGNOSIS — R262 Difficulty in walking, not elsewhere classified: Secondary | ICD-10-CM

## 2023-04-10 DIAGNOSIS — M25661 Stiffness of right knee, not elsewhere classified: Secondary | ICD-10-CM | POA: Diagnosis not present

## 2023-04-10 DIAGNOSIS — R600 Localized edema: Secondary | ICD-10-CM | POA: Diagnosis not present

## 2023-04-10 DIAGNOSIS — M6281 Muscle weakness (generalized): Secondary | ICD-10-CM

## 2023-04-10 NOTE — Therapy (Signed)
OUTPATIENT PHYSICAL THERAPY LOWER EXTREMITY TREATMENT   Patient Name: Alicia Herrera MRN: [id number] DOB:09/30/1946, 77 y.o., female Today's Date: 04/10/2023  END OF SESSION:  PT End of Session - 04/10/23 1149     Visit Number 9    Number of Visits 16    Date for PT Re-Evaluation 05/08/23    Authorization Type [employer]    Authorization Time Period 12 visits approved for PT 03/13/2023-05/08/2023    Authorization - Number of Visits 12    Progress Note Due on Visit 10    PT Start Time 1149    PT Stop Time 1230    PT Time Calculation (min) 41 min    Activity Tolerance Patient tolerated treatment well    Behavior During Therapy WFL for tasks assessed/performed             Past Medical History:  Diagnosis Date   Arthritis    Coronary artery disease    Heart murmur    Hx of CABG    Hypertension    Hypothyroidism    hx of 25 years ago per pt   Rectocele    Uterine prolapse    Past Surgical History:  Procedure Laterality Date   CARDIAC VALVE REPLACEMENT     AVR   CORONARY ARTERY BYPASS GRAFT     prolapsed uterus surgery      TOTAL KNEE ARTHROPLASTY Right 02/19/2023   Procedure: TOTAL KNEE ARTHROPLASTY;  Surgeon: Jodi Geralds, MD;  Location: [facility];  Service: Orthopedics;  Laterality: Right;   Patient Active Problem List   Diagnosis Date Noted   Low back pain 03/06/2023   Pulmonary embolism with acute cor pulmonale (HCC) 02/27/2023   Primary osteoarthritis of right knee 02/19/2023   Status post total knee replacement, right 02/19/2023   Cyst of skin 11/18/2021   Primary osteoarthritis of both knees 05/18/2020   Uterine prolapse 03/26/2019   Rectocele 03/26/2019   Postmenopausal bleeding 03/26/2019   OAB (overactive bladder) 03/26/2019   Midline cystocele 03/26/2019   S/P CABG (coronary artery bypass graft) 11/10/2013   Essential hypertension 11/10/2013   S/P ascending aortic aneurysm repair 10/10/2013   Hyperlipidemia 08/15/2013    PCP: [name]  DO  REFERRING PROVIDER: Jodi Geralds, MD  REFERRING DIAG: [phone] (ICD-10-CM) - S/P R total knee arthroplasty  THERAPY DIAG:  Chronic pain of right knee  Stiffness of right knee, not elsewhere classified  Difficulty in walking, not elsewhere classified  Localized edema  Muscle weakness (generalized)  Rationale for Evaluation and Treatment: Rehabilitation  ONSET DATE: 02/19/23  SUBJECTIVE:   SUBJECTIVE STATEMENT: Pt states R knee was a little stiff and achy from the weather. Has been able to practice going up steps in reciprocal pattern without pain.   PERTINENT HISTORY: L knee OA  PAIN:  Are you having pain? Yes: NPRS scale: 1/10 Pain location: along incision Pain description: Soreness Aggravating factors: Compression stockings Relieving factors: Ice  PRECAUTIONS: None  WEIGHT BEARING RESTRICTIONS: No  FALLS:  Has patient fallen in last 6 months? 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  PLOF: Independent  PATIENT GOALS: Return to gardening, walking, and sewing  NEXT MD VISIT: 04/24/23  OBJECTIVE:   DIAGNOSTIC FINDINGS: Ultrasound 02/27/23 that demonstrated  R LE DVT  PATIENT SURVEYS:  LEFS 51.2%  EDEMA:  Circumferential: 48.5 cm (R), 42.8 (L)  MUSCLE LENGTH: Hamstrings: Right 65 deg; Left 90 deg Thomas test: did not assess  POSTURE:  L knee in slight varus and knee flexion; R knee in slight knee flexion  PALPATION: Patellar mobility WNL on R; mildly hypomobile on L with medial/lateral movements TTP and tightness in R hamstring  LOWER EXTREMITY ROM:  A/P ROM Right eval Left eval Right 03/20/23 Right 04/05/23  Hip flexion      Hip extension      Hip abduction       Hip adduction      Hip internal rotation      Hip external rotation      Knee flexion 105/110 110/110 115 PROM 120 AROM  Knee extension -10/-10 -5/-5 -5 PROM 0 AROM  Ankle dorsiflexion      Ankle plantarflexion      Ankle inversion      Ankle eversion       (Blank rows = not tested)  LOWER EXTREMITY MMT:  MMT Right eval Left eval  Hip flexion 4+ 3+  Hip extension 4- 4-  Hip abduction 3+ 3+  Hip adduction    Hip internal rotation    Hip external rotation    Knee flexion 5 5  Knee extension 4 5  Ankle dorsiflexion    Ankle plantarflexion    Ankle inversion    Ankle eversion     (Blank rows = not tested)  LOWER EXTREMITY SPECIAL TESTS:  Did not assess  FUNCTIONAL TESTS:  5 times sit to stand: 25.19 sec with UEs Tandem stance: 29.81 sec on R LE; 37.16 sec on L LE; SLS: 2 sec on R, 3 sec on L  GAIT: Distance walked: 100' Assistive device utilized: None Level of assistance: Complete Independence Comments: R knee slightly flexed throughout gait, decreased knee flexion  during swing and decreased knee extension during heel strike   TODAY'S TREATMENT:   [facility] Adult PT Treatment:                                                DATE: 04/10/23 Therapeutic Exercise: Nustep L6 x 5 min LEs/UEs Standing hamstring stretch on step x 30 sec Step down 4" step 2x10 with bilat UE support Wall squat 2x10  Standing hip abd 2x10 green TB Standing hip ext 2x10 green TB Standing hamstring curl 2x10 green TB SLS 2x30 sec Eccentric heel raises 2x10 DL leg press 98# 1X91 Runner's step up blue side of bosu 2x10 Side step up on blue side of bosu x10   [facility] Adult PT Treatment:                                                DATE: 04/05/23 Therapeutic Exercise: Recumbent L3 x 5 min Seated hamstring stretch x 30 sec Supine hamstring stretch with strap x 30 sec Supine ITB stretch with strap x 30 sec Prone knee hang x2 min Prone knee hang with quad setting 2x10 Runner's step up 6" step  x10 with 1 hand on rail, x10 without UE support Step down 4" step 2x10 with bilat UE support Standing on step gastroc stretch  x 30 sec Standing on step soleus stretch x 30 sec SL heel raise on step 2x10 Neuromuscular re-ed: Standing on foam feet together, EO, head turns 2x30 sec, head nods 2x30 sec Slow march on foam 2x10  [facility] Adult PT Treatment:                                                DATE: 04/03/2023 Therapeutic Exercise: Recumbent bike L2 x 5 min Standing: TKE GTB 10x5" Heel/toe raises 2x15 Runner's lunge (R) Resisted 3-way hip RTB: abd & ext 2x12 each Marching RTB crossed at forefoot 2x10 HS curls RTB 2x12 Supine ITB stretch w/strap 3x30" Leg press: DL 16# [id number] (folded towel under L foot) 6" step step up/down Modalities: Vaso, 34 deg, no compression x 10 min    [facility] Adult PT Treatment:                                                DATE: 03/29/23 Therapeutic Exercise: Recumbent bike L1 x 5 min Gastroc stretch on step x30 sec Soleus stretch on step x30 sec Standing hip abd red TB 2x10 Standing hip ext red TB 2x10 Standing hip flex red TB 2x10 Standing hamstring curl red TB 2x10 Sit<>stand red TB around knees 2x10 Heel/toe raises 2x10 Terminal knee ext red TB 2x10 DL Leg press 04# [id number] Gait: X3 laps around gym working on knee ext/hip ext Modalities: Vaso, 34 deg, no compression x    PATIENT EDUCATION:  Education details: Exam findings, POC, initial HEP Person educated: Patient Education method: Explanation, Demonstration, and Handouts Education comprehension: verbalized understanding, returned demonstration, and needs further education  HOME EXERCISE PROGRAM: Access Code: [email or web address] URL: [email or web address] Date: 03/13/2023 Prepared by: Vernon Prey April Kirstie Peri  Exercises - Seated Hamstring Stretch  - 1 x daily - 7 x weekly - 2 sets - 30 sec hold - Seated Knee Extension with Resistance  - 1 x daily - 7 x weekly - 2 sets - 10  reps - Seated Hamstring Curls with Resistance  - 1 x daily - 7 x weekly - 2 sets - 10 reps - Hip Abduction with Resistance Loop  - 1 x daily - 7 x weekly - 2 sets - 10 reps - Hip Extension with Resistance Loop  - 1 x daily - 7 x weekly - 2 sets - 10 reps - Quad Setting and Stretching  - 1 x daily - 7 x weekly - 2 sets - 10 reps - 3 sec hold  ASSESSMENT:  CLINICAL IMPRESSION: Continued to work on quad strengthening and eccentric quad control. Pt able to perform reciprocal pattern on steps with hand rail -- unable to perform without UE support at this time. Pt with improving stability and balance.    OBJECTIVE IMPAIRMENTS: Abnormal gait, decreased activity tolerance, decreased balance, decreased endurance, decreased mobility, difficulty walking, decreased ROM, decreased strength, hypomobility, increased edema, increased fascial restrictions, impaired flexibility, improper body mechanics, postural dysfunction, and pain.    GOALS: Goals reviewed with patient? Yes  SHORT TERM GOALS: Target date: 04/10/2023  Pt will be ind with initial HEP Baseline: Goal status: MET  2.  Pt will be able to ascend/descend stairs with normal reciprocal gait Baseline:  04/10/23: Able  to perform with hand rail Goal status: IN PROGRESS  LONG TERM GOALS: Target date: 05/08/2023  Pt will be ind with progression and advancement of HEP Baseline:  Goal status: INITIAL  2.  Pt will have improved R knee ROM from 0 to 115 deg Baseline:  Goal status: MET 04/05/23  3.  Pt will have improved 5x STS to </=15 sec without UE support to demo increased functional LE strength Baseline:  Goal status: INITIAL  4.  Pt will be able to amb >/=1000' ind for community mobility Baseline:  Goal status: INITIAL  5.  Pt will have improved LEFS to at least 61% to demo MCID Baseline:  Goal status: INITIAL   PLAN:  PT FREQUENCY: 2x/week  PT DURATION: 8 weeks  PLANNED INTERVENTIONS: Therapeutic exercises, Therapeutic  activity, Neuromuscular re-education, Balance training, Gait training, Patient/Family education, Self Care, Joint mobilization, Stair training, Aquatic Therapy, Dry Needling, Electrical stimulation, Cryotherapy, Moist heat, scar mobilization, Taping, Vasopneumatic device, Ionotophoresis 4mg /ml Dexamethasone, Manual therapy, and Re-evaluation  PLAN FOR NEXT SESSION: Continue gross LE strengthening as tolerated . Work on knee ROM as able. Leg press/machine strengthening. Stairs/step ups and downs    Lucely Leard April Ma L [place], PT 04/10/2023, 11:49 AM

## 2023-04-12 ENCOUNTER — Ambulatory Visit: Payer: Medicare HMO

## 2023-04-12 DIAGNOSIS — R600 Localized edema: Secondary | ICD-10-CM | POA: Diagnosis not present

## 2023-04-12 DIAGNOSIS — G8929 Other chronic pain: Secondary | ICD-10-CM | POA: Diagnosis not present

## 2023-04-12 DIAGNOSIS — R262 Difficulty in walking, not elsewhere classified: Secondary | ICD-10-CM

## 2023-04-12 DIAGNOSIS — M25661 Stiffness of right knee, not elsewhere classified: Secondary | ICD-10-CM

## 2023-04-12 DIAGNOSIS — M25561 Pain in right knee: Secondary | ICD-10-CM | POA: Diagnosis not present

## 2023-04-12 DIAGNOSIS — M6281 Muscle weakness (generalized): Secondary | ICD-10-CM

## 2023-04-12 NOTE — Therapy (Addendum)
OUTPATIENT PHYSICAL THERAPY LOWER EXTREMITY TREATMENT AND 10TH VISIT PROGRESS NOTE  Progress Note Reporting Period 03/13/23 to 04/12/2023  See note below for Objective Data and Assessment of Progress/Goals.    Patient Name: Alicia Herrera MRN: [id number] DOB:1945/11/16, 77 y.o., female Today's Date: 04/12/2023  END OF SESSION:  PT End of Session - 04/12/23 1152     Visit Number 10    Number of Visits 16    Date for PT Re-Evaluation 05/08/23    Authorization Type [employer]    Authorization Time Period 12 visits approved for PT 03/13/2023-05/08/2023    Authorization - Number of Visits 12    Progress Note Due on Visit 10    PT Start Time 1150    PT Stop Time 1230    PT Time Calculation (min) 40 min    Activity Tolerance Patient tolerated treatment well    Behavior During Therapy WFL for tasks assessed/performed             Past Medical History:  Diagnosis Date   Arthritis    Coronary artery disease    Heart murmur    Hx of CABG    Hypertension    Hypothyroidism    hx of 25 years ago per pt   Rectocele    Uterine prolapse    Past Surgical History:  Procedure Laterality Date   CARDIAC VALVE REPLACEMENT     AVR   CORONARY ARTERY BYPASS GRAFT     prolapsed uterus surgery      TOTAL KNEE ARTHROPLASTY Right 02/19/2023   Procedure: TOTAL KNEE ARTHROPLASTY;  Surgeon: Jodi Geralds, MD;  Location: [facility];  Service: Orthopedics;  Laterality: Right;   Patient Active Problem List   Diagnosis Date Noted   Low back pain 03/06/2023   Pulmonary embolism with acute cor pulmonale (HCC) 02/27/2023   Primary osteoarthritis of right knee 02/19/2023   Status post total knee replacement, right 02/19/2023   Cyst of skin 11/18/2021   Primary osteoarthritis of both knees 05/18/2020   Uterine prolapse 03/26/2019   Rectocele 03/26/2019   Postmenopausal bleeding 03/26/2019   OAB (overactive bladder) 03/26/2019   Midline cystocele 03/26/2019   S/P CABG (coronary artery bypass  graft) 11/10/2013   Essential hypertension 11/10/2013   S/P ascending aortic aneurysm repair 10/10/2013   Hyperlipidemia 08/15/2013    PCP: [name] DO  REFERRING PROVIDER: Jodi Geralds, MD  REFERRING DIAG: [phone] (ICD-10-CM) - S/P R total knee arthroplasty  THERAPY DIAG:  Stiffness of right knee, not elsewhere classified  Chronic pain of right knee  Difficulty in walking, not elsewhere classified  Localized edema  Muscle weakness (generalized)  Rationale for Evaluation and Treatment: Rehabilitation  ONSET DATE: 02/19/23  SUBJECTIVE:   SUBJECTIVE STATEMENT: Patient reports she has a numb spt below kneecap on R. Patient is compliant with HEP. Patient states she was able to ascend stairs with R leg leading before needing to switch to L due to fatigue. Patient reports minimal pain today with a "twinge" behind the knee.  PERTINENT HISTORY: L knee OA  PAIN:  Are you having pain? Yes: NPRS scale: 1/10 Pain location: along incision Pain description: Soreness Aggravating factors: Compression stockings Relieving factors: Ice  PRECAUTIONS: None  WEIGHT BEARING RESTRICTIONS: No  FALLS:  Has patient fallen in last 6 months? 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  PLOF: Independent  PATIENT GOALS: Return to gardening, walking, and sewing  NEXT MD VISIT: 04/24/23  OBJECTIVE:   DIAGNOSTIC FINDINGS: Ultrasound 02/27/23 that demonstrated R LE DVT  PATIENT SURVEYS:  LEFS 51.2%  EDEMA:  Circumferential: 48.5 cm (R), 42.8 (L)  MUSCLE LENGTH: Hamstrings: Right 65 deg; Left 90 deg Thomas test: did not assess  POSTURE:  L knee in slight varus and knee flexion; R knee in slight knee  flexion  PALPATION: Patellar mobility WNL on R; mildly hypomobile on L with medial/lateral movements TTP and tightness in R hamstring  LOWER EXTREMITY ROM:  A/P ROM Right eval Left eval Right 03/20/23 Right 04/12/23  Hip flexion      Hip extension      Hip abduction      Hip adduction      Hip internal rotation      Hip external rotation      Knee flexion 105/110 110/110 115 PROM 120 AROM  Knee extension -10/-10 -5/-5 -5 PROM -2 AROM  Ankle dorsiflexion      Ankle plantarflexion      Ankle inversion      Ankle eversion       (Blank rows = not tested)  LOWER EXTREMITY MMT:  MMT Right eval Left eval Right  04/12/2023  Hip flexion 4+ 3+   Hip extension 4- 4-   Hip abduction 3+ 3+   Hip adduction     Hip internal rotation     Hip external rotation     Knee flexion 5 5   Knee extension 4 5   Ankle dorsiflexion     Ankle plantarflexion     Ankle inversion     Ankle eversion      (Blank rows = not tested)  LOWER EXTREMITY SPECIAL TESTS:  Did not assess  FUNCTIONAL TESTS:  5 times sit to stand: 25.19 sec with UEs Tandem stance: 29.81 sec on R LE; 37.16 sec on L LE; SLS: 2 sec on R, 3 sec on L  GAIT: Distance walked: 100' Assistive device utilized: None Level of assistance: Complete Independence Comments: R knee slightly flexed throughout gait, decreased knee flexion  during swing and decreased knee extension during heel strike   TODAY'S TREATMENT:   [facility] Adult PT Treatment:                                                DATE: 04/12/2023 Therapeutic Exercise: Recumbent bike L1 x + subjective intake Knee measurements (see above) HS/ITB stretches w/strap Leg press DL 40# J81 Prone passive knee extension hang x 2 min Long-distance walking: 80'x13L Gastroc/soleus stretches on slantboard x30" each    [facility] Adult PT Treatment:                                                DATE: 04/10/23 Therapeutic Exercise: Nustep L6 x 5 min LEs/UEs Standing hamstring  stretch on step x 30 sec Step down 4" step 2x10 with bilat UE support Wall squat 2x10  Standing hip abd 2x10 green TB Standing hip ext 2x10 green TB Standing hamstring curl 2x10  green TB SLS 2x30 sec Eccentric heel raises 2x10 DL leg press 21# [id number] Runner's step up blue side of bosu 2x10 Side step up on blue side of bosu x10   [facility] Adult PT Treatment:                                                DATE: 04/05/23 Therapeutic Exercise: Recumbent L3 x 5 min Seated hamstring stretch x 30 sec Supine hamstring stretch with strap x 30 sec Supine ITB stretch with strap x 30 sec Prone knee hang x2 min Prone knee hang with quad setting 2x10 Runner's step up 6" step x10 with 1 hand on rail, x10 without UE support Step down 4" step 2x10 with bilat UE support Standing on step gastroc stretch x 30 sec Standing on step soleus stretch x 30 sec SL heel raise on step 2x10 Neuromuscular re-ed: Standing on foam feet together, EO, head turns 2x30 sec, head nods 2x30 sec Slow march on foam 2x10   PATIENT EDUCATION:  Education details: Exam findings, POC, initial HEP Person educated: Patient Education method: Explanation, Demonstration, and Handouts Education comprehension: verbalized understanding, returned demonstration, and needs further education  HOME EXERCISE PROGRAM: Access Code: [email or web address] URL: [email or web address] Date: 03/13/2023 Prepared by: Vernon Prey April Kirstie Peri  Exercises - Seated Hamstring Stretch  - 1 x daily - 7 x weekly - 2 sets - 30 sec hold - Seated Knee Extension with Resistance  - 1 x daily - 7 x weekly - 2 sets - 10 reps - Seated Hamstring Curls with Resistance  - 1 x daily - 7 x weekly - 2 sets - 10 reps - Hip Abduction with Resistance Loop  - 1 x daily - 7 x weekly - 2 sets - 10 reps - Hip Extension with Resistance Loop  - 1 x daily - 7 x weekly - 2 sets - 10 reps - Quad Setting and Stretching  - 1 x daily - 7 x weekly - 2 sets - 10 reps - 3 sec  hold  ASSESSMENT:  CLINICAL IMPRESSION: Good progression demonstrated with knee flexion AROM; mild extension lag with total knee extension. Patient continues to require UE support with sit to stand transitions. Good endurance demonstrated with prolonged walking; patient reported mild discomfort in L knee however no increased symptoms in R LE. Patient able to ascend/descend stairs with reciprocal stepping pattern without UE support.     OBJECTIVE IMPAIRMENTS: Abnormal gait, decreased activity tolerance, decreased balance, decreased endurance, decreased mobility, difficulty walking, decreased ROM, decreased strength, hypomobility, increased edema, increased fascial restrictions, impaired flexibility, improper body mechanics, postural dysfunction, and pain.    GOALS: Goals reviewed with patient? Yes  SHORT TERM GOALS: Target date: 04/10/2023  Pt will be ind with initial HEP Goal status: MET  2.  Pt will be able to ascend/descend stairs with normal reciprocal gait Goal status: MET  LONG TERM GOALS: Target date: 05/08/2023  Pt will be ind with progression and advancement of HEP Baseline:  Goal status: MET  2.  Pt will have improved R knee ROM from 0 to 115 deg Baseline:  Goal status: IN PROGRESS (TKE -2*)  3.  Pt will have improved 5x STS to </=15 sec without UE support to demo increased functional LE strength Baseline: 17 sec with UE support Goal status: IN PROGRESS   4.  Pt will  be able to amb >/=1000' ind for community mobility Baseline:  Goal status: IN PROGRESS  5.  Pt will have improved LEFS to at least 61% to demo MCID Baseline: 71.3% Goal status: MET   PLAN:  PT FREQUENCY: 2x/week  PT DURATION: 8 weeks  PLANNED INTERVENTIONS: Therapeutic exercises, Therapeutic activity, Neuromuscular re-education, Balance training, Gait training, Patient/Family education, Self Care, Joint mobilization, Stair training, Aquatic Therapy, Dry Needling, Electrical stimulation,  Cryotherapy, Moist heat, scar mobilization, Taping, Vasopneumatic device, Ionotophoresis 4mg /ml Dexamethasone, Manual therapy, and Re-evaluation  PLAN FOR NEXT SESSION: Continue gross LE strengthening as tolerated. Work on knee ROM as able. Leg press/machine strengthening. Stairs/step ups and downs   Carlynn Herald, [place] 04/12/2023 12:35 PM

## 2023-04-16 ENCOUNTER — Ambulatory Visit: Payer: Medicare HMO | Attending: Orthopedic Surgery

## 2023-04-16 DIAGNOSIS — G8929 Other chronic pain: Secondary | ICD-10-CM | POA: Insufficient documentation

## 2023-04-16 DIAGNOSIS — R262 Difficulty in walking, not elsewhere classified: Secondary | ICD-10-CM | POA: Diagnosis not present

## 2023-04-16 DIAGNOSIS — M25561 Pain in right knee: Secondary | ICD-10-CM | POA: Diagnosis not present

## 2023-04-16 DIAGNOSIS — R600 Localized edema: Secondary | ICD-10-CM | POA: Diagnosis not present

## 2023-04-16 DIAGNOSIS — M25661 Stiffness of right knee, not elsewhere classified: Secondary | ICD-10-CM | POA: Insufficient documentation

## 2023-04-16 DIAGNOSIS — M6281 Muscle weakness (generalized): Secondary | ICD-10-CM | POA: Diagnosis not present

## 2023-04-16 NOTE — Therapy (Addendum)
OUTPATIENT PHYSICAL THERAPY LOWER EXTREMITY TREATMENT   Patient Name: Alicia Herrera MRN: [id number] DOB:05-Sep-1946, 77 y.o., female Today's Date: 04/16/2023  END OF SESSION:  PT End of Session - 04/16/23 1146     Visit Number 11    Number of Visits 16    Date for PT Re-Evaluation 05/08/23    Authorization Type [employer]    Authorization Time Period 12 visits approved for PT 03/13/2023-05/08/2023    Authorization - Visit Number 11    Authorization - Number of Visits 12    PT Start Time 1145    PT Stop Time 1223    PT Time Calculation (min) 38 min    Activity Tolerance Patient tolerated treatment well    Behavior During Therapy WFL for tasks assessed/performed             Past Medical History:  Diagnosis Date   Arthritis    Coronary artery disease    Heart murmur    Hx of CABG    Hypertension    Hypothyroidism    hx of 25 years ago per pt   Rectocele    Uterine prolapse    Past Surgical History:  Procedure Laterality Date   CARDIAC VALVE REPLACEMENT     AVR   CORONARY ARTERY BYPASS GRAFT     prolapsed uterus surgery      TOTAL KNEE ARTHROPLASTY Right 02/19/2023   Procedure: TOTAL KNEE ARTHROPLASTY;  Surgeon: Jodi Geralds, MD;  Location: [facility];  Service: Orthopedics;  Laterality: Right;   Patient Active Problem List   Diagnosis Date Noted   Low back pain 03/06/2023   Pulmonary embolism with acute cor pulmonale (HCC) 02/27/2023   Primary osteoarthritis of right knee 02/19/2023   Status post total knee replacement, right 02/19/2023   Cyst of skin 11/18/2021   Primary osteoarthritis of both knees 05/18/2020   Uterine prolapse 03/26/2019   Rectocele 03/26/2019   Postmenopausal bleeding 03/26/2019   OAB (overactive bladder) 03/26/2019   Midline cystocele 03/26/2019   S/P CABG (coronary artery bypass graft) 11/10/2013   Essential hypertension 11/10/2013   S/P ascending aortic aneurysm repair 10/10/2013   Hyperlipidemia 08/15/2013    PCP: [name]  DO  REFERRING PROVIDER: Jodi Geralds, MD  REFERRING DIAG: [phone] (ICD-10-CM) - S/P R total knee arthroplasty  THERAPY DIAG:  Stiffness of right knee, not elsewhere classified  Chronic pain of right knee  Difficulty in walking, not elsewhere classified  Localized edema  Muscle weakness (generalized)  Rationale for Evaluation and Treatment: Rehabilitation  ONSET DATE: 02/19/23  SUBJECTIVE:   SUBJECTIVE STATEMENT: Patient reports she was stung by yellow jackets on her L hand and R lower leg. Patient reports 1/10 pain in knee, states it feels more stiff than pain.  PERTINENT HISTORY: L knee OA  PAIN:  Are you having pain? Yes: NPRS scale: 1/10 Pain location: along incision Pain description: Soreness Aggravating factors: Compression stockings Relieving factors: Ice  PRECAUTIONS: None  WEIGHT BEARING RESTRICTIONS: No  FALLS:  Has patient fallen in last 6 months? 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  PLOF: Independent  PATIENT GOALS: Return to gardening, walking, and sewing  NEXT MD VISIT: 04/24/23  OBJECTIVE:   DIAGNOSTIC FINDINGS: Ultrasound 02/27/23 that  demonstrated R LE DVT  PATIENT SURVEYS:  LEFS 51.2%  EDEMA:  Circumferential: 48.5 cm (R), 42.8 (L)  MUSCLE LENGTH: Hamstrings: Right 65 deg; Left 90 deg Thomas test: did not assess  POSTURE:  L knee in slight varus and knee flexion; R knee in slight knee flexion  PALPATION: Patellar mobility WNL on R; mildly hypomobile on L with medial/lateral movements TTP and tightness in R hamstring  LOWER EXTREMITY ROM:  A/P ROM Right eval Left eval Right 03/20/23 Right 04/12/23  Hip flexion      Hip extension      Hip  abduction      Hip adduction      Hip internal rotation      Hip external rotation      Knee flexion 105/110 110/110 115 PROM 120 AROM  Knee extension -10/-10 -5/-5 -5 PROM -2 AROM  Ankle dorsiflexion      Ankle plantarflexion      Ankle inversion      Ankle eversion       (Blank rows = not tested)  LOWER EXTREMITY MMT:  MMT Right eval Left eval Right  04/12/2023  Hip flexion 4+ 3+   Hip extension 4- 4-   Hip abduction 3+ 3+   Hip adduction     Hip internal rotation     Hip external rotation     Knee flexion 5 5   Knee extension 4 5   Ankle dorsiflexion     Ankle plantarflexion     Ankle inversion     Ankle eversion      (Blank rows = not tested)  LOWER EXTREMITY SPECIAL TESTS:  Did not assess  FUNCTIONAL TESTS:  5 times sit to stand: 25.19 sec with UEs Tandem stance: 29.81 sec on R LE; 37.16 sec on L LE; SLS: 2 sec on R, 3 sec on L  GAIT: Distance walked: 100' Assistive device utilized: None Level of assistance: Complete Independence Comments: R knee slightly flexed throughout gait, decreased knee flexion  during swing and decreased knee extension during heel strike   TODAY'S TREATMENT:   [facility] Adult PT Treatment:                                                DATE: 04/16/2023 Therapeutic Exercise: Recumbent bike L2 x 5 min Passive leg extension hang x 2 min Wall squats 2x10 Standing HS on step x 30" Standing hip abd 2x12 GTB Standing hip ext 2x10 GTB Eccentric heel raises on 4" step 2x10 Step down 4" step 2x10 HHAx2 DL leg press 47# W29    [facility] Adult PT Treatment:                                                DATE: 04/12/2023 Therapeutic Exercise: Recumbent bike L1 x + subjective intake Knee measurements (see above) HS/ITB stretches w/strap Leg press DL 56# O13 Prone passive knee extension hang x 2 min Long-distance walking: 80'x13L Gastroc/soleus stretches on slantboard x30" each    [facility] Adult PT Treatment:  DATE: 04/10/23 Therapeutic Exercise: Nustep L6 x 5 min LEs/UEs Standing hamstring stretch on step x 30 sec Step down 4" step 2x10 with bilat UE support Wall squat 2x10  Standing hip abd 2x10 green TB Standing hip ext 2x10 green TB Standing hamstring curl 2x10 green TB SLS 2x30 sec Eccentric heel raises 2x10 DL leg press 78# 2N56 Runner's step up blue side of bosu 2x10 Side step up on blue side of bosu x10   PATIENT EDUCATION:  Education details: Exam findings, POC, initial HEP Person educated: Patient Education method: Explanation, Demonstration, and Handouts Education comprehension: verbalized understanding, returned demonstration, and needs further education  HOME EXERCISE PROGRAM: Access Code: [email or web address] URL: [email or web address] Date: 03/13/2023 Prepared by: Vernon Prey April Kirstie Peri  Exercises - Seated Hamstring Stretch  - 1 x daily - 7 x weekly - 2 sets - 30 sec hold - Seated Knee Extension with Resistance  - 1 x daily - 7 x weekly - 2 sets - 10 reps - Seated Hamstring Curls with Resistance  - 1 x daily - 7 x weekly - 2 sets - 10 reps - Hip Abduction with Resistance Loop  - 1 x daily - 7 x weekly - 2 sets - 10 reps - Hip Extension with Resistance Loop  - 1 x daily - 7 x weekly - 2 sets - 10 reps - Quad Setting and Stretching  - 1 x daily - 7 x weekly - 2 sets - 10 reps - 3 sec hold  ASSESSMENT:  CLINICAL IMPRESSION: Prone knee extension hang continued to progress total knee extension ROM. Lateral lean to L noted during concentric phase of wall squat; cueing smaller ROM improved R LE weight bearing and decreased pain symptoms.    OBJECTIVE IMPAIRMENTS: Abnormal gait, decreased activity tolerance, decreased balance, decreased endurance, decreased mobility, difficulty walking, decreased ROM, decreased strength, hypomobility, increased edema, increased fascial restrictions, impaired flexibility, improper body mechanics, postural dysfunction, and  pain.    GOALS: Goals reviewed with patient? Yes  SHORT TERM GOALS: Target date: 04/10/2023  Pt will be ind with initial HEP Goal status: MET  2.  Pt will be able to ascend/descend stairs with normal reciprocal gait Goal status: MET  LONG TERM GOALS: Target date: 05/08/2023  Pt will be ind with progression and advancement of HEP Baseline:  Goal status: MET  2.  Pt will have improved R knee ROM from 0 to 115 deg Baseline:  Goal status: IN PROGRESS (TKE -2*)  3.  Pt will have improved 5x STS to </=15 sec without UE support to demo increased functional LE strength Baseline: 17 sec with UE support Goal status: IN PROGRESS   4.  Pt will be able to amb >/=1000' ind for community mobility Baseline:  Goal status: IN PROGRESS  5.  Pt will have improved LEFS to at least 61% to demo MCID Baseline: 71.3% Goal status: MET   PLAN:  PT FREQUENCY: 2x/week  PT DURATION: 8 weeks  PLANNED INTERVENTIONS: Therapeutic exercises, Therapeutic activity, Neuromuscular re-education, Balance training, Gait training, Patient/Family education, Self Care, Joint mobilization, Stair training, Aquatic Therapy, Dry Needling, Electrical stimulation, Cryotherapy, Moist heat, scar mobilization, Taping, Vasopneumatic device, Ionotophoresis 4mg /ml Dexamethasone, Manual therapy, and Re-evaluation  PLAN FOR NEXT SESSION: Next visit 12th; needs more [facility] authorization. Progress gross LE strengthening as tolerated. Work on knee ROM as able. Leg press/machine strengthening. Stairs/step ups and downs   Carlynn Herald, [place] 04/16/2023 12:24 PM

## 2023-04-19 ENCOUNTER — Encounter: Payer: Self-pay | Admitting: Physical Therapy

## 2023-04-19 ENCOUNTER — Ambulatory Visit: Payer: Medicare HMO | Admitting: Physical Therapy

## 2023-04-19 DIAGNOSIS — R600 Localized edema: Secondary | ICD-10-CM

## 2023-04-19 DIAGNOSIS — G8929 Other chronic pain: Secondary | ICD-10-CM | POA: Diagnosis not present

## 2023-04-19 DIAGNOSIS — M6281 Muscle weakness (generalized): Secondary | ICD-10-CM

## 2023-04-19 DIAGNOSIS — R262 Difficulty in walking, not elsewhere classified: Secondary | ICD-10-CM

## 2023-04-19 DIAGNOSIS — M25561 Pain in right knee: Secondary | ICD-10-CM | POA: Diagnosis not present

## 2023-04-19 DIAGNOSIS — M25661 Stiffness of right knee, not elsewhere classified: Secondary | ICD-10-CM

## 2023-04-19 NOTE — Therapy (Signed)
OUTPATIENT PHYSICAL THERAPY LOWER EXTREMITY TREATMENT   Patient Name: Alicia Herrera MRN: [id number] DOB:06/21/1946, 77 y.o., female Today's Date: 04/19/2023  END OF SESSION:  PT End of Session - 04/19/23 1146     Visit Number 12    Number of Visits 16    Date for PT Re-Evaluation 05/08/23    Authorization Type [employer]    Authorization Time Period 12 visits approved for PT 03/13/2023-05/08/2023    Authorization - Visit Number 12    Authorization - Number of Visits 12    PT Start Time 1146    PT Stop Time 1230    PT Time Calculation (min) 44 min    Activity Tolerance Patient tolerated treatment well    Behavior During Therapy WFL for tasks assessed/performed             Past Medical History:  Diagnosis Date   Arthritis    Coronary artery disease    Heart murmur    Hx of CABG    Hypertension    Hypothyroidism    hx of 25 years ago per pt   Rectocele    Uterine prolapse    Past Surgical History:  Procedure Laterality Date   CARDIAC VALVE REPLACEMENT     AVR   CORONARY ARTERY BYPASS GRAFT     prolapsed uterus surgery      TOTAL KNEE ARTHROPLASTY Right 02/19/2023   Procedure: TOTAL KNEE ARTHROPLASTY;  Surgeon: Jodi Geralds, MD;  Location: [facility];  Service: Orthopedics;  Laterality: Right;   Patient Active Problem List   Diagnosis Date Noted   Low back pain 03/06/2023   Pulmonary embolism with acute cor pulmonale (HCC) 02/27/2023   Primary osteoarthritis of right knee 02/19/2023   Status post total knee replacement, right 02/19/2023   Cyst of skin 11/18/2021   Primary osteoarthritis of both knees 05/18/2020   Uterine prolapse 03/26/2019   Rectocele 03/26/2019   Postmenopausal bleeding 03/26/2019   OAB (overactive bladder) 03/26/2019   Midline cystocele 03/26/2019   S/P CABG (coronary artery bypass graft) 11/10/2013   Essential hypertension 11/10/2013   S/P ascending aortic aneurysm repair 10/10/2013   Hyperlipidemia 08/15/2013    PCP: [name]  DO  REFERRING PROVIDER: Jodi Geralds, MD  REFERRING DIAG: [phone] (ICD-10-CM) - S/P R total knee arthroplasty  THERAPY DIAG:  Stiffness of right knee, not elsewhere classified  Chronic pain of right knee  Difficulty in walking, not elsewhere classified  Localized edema  Muscle weakness (generalized)  Rationale for Evaluation and Treatment: Rehabilitation  ONSET DATE: 02/19/23  SUBJECTIVE:   SUBJECTIVE STATEMENT: Pt states she is able to do stairs carefully in reciprocal pattern but has to watch her L knee. "Not too much of pain." Pt states she is able to walk >2 blocks without R LE pain -- L knee would bother her more.   PERTINENT HISTORY: L knee OA  PAIN:  Are you having pain? Yes: NPRS scale: 1/10 Pain location: along incision Pain description: Soreness Aggravating factors: Compression stockings Relieving factors: Ice  PRECAUTIONS: None  WEIGHT BEARING RESTRICTIONS: No  FALLS:  Has patient fallen in last 6 months? 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  PLOF: Independent  PATIENT GOALS: Return to gardening, walking,  and sewing  NEXT MD VISIT: 04/24/23  OBJECTIVE:   DIAGNOSTIC FINDINGS: Ultrasound 02/27/23 that demonstrated R LE DVT  PATIENT SURVEYS:  LEFS 51.2%  EDEMA:  Circumferential: 48.5 cm (R), 42.8 (L)  MUSCLE LENGTH: Hamstrings: Right 65 deg; Left 90 deg Thomas test: did not assess  POSTURE:  L knee in slight varus and knee flexion; R knee in slight knee flexion  PALPATION: Patellar mobility WNL on R; mildly hypomobile on L with medial/lateral movements TTP and tightness in R hamstring  LOWER EXTREMITY ROM:  A/P ROM Right eval Left eval Right 03/20/23  Right 04/12/23   Hip flexion       Hip extension       Hip abduction       Hip adduction       Hip internal rotation       Hip external rotation       Knee flexion 105/110 110/110 115 PROM 120 AROM   Knee extension -10/-10 -5/-5 -5 PROM -2 AROM 0 AROM  Ankle dorsiflexion       Ankle plantarflexion       Ankle inversion       Ankle eversion        (Blank rows = not tested)  LOWER EXTREMITY MMT:  MMT Right eval Left eval Right  04/19/23  Hip flexion 4+ 3+   Hip extension 4- 4-   Hip abduction 3+ 3+   Hip adduction     Hip internal rotation     Hip external rotation     Knee flexion 5 5   Knee extension 4 5   Ankle dorsiflexion     Ankle plantarflexion     Ankle inversion     Ankle eversion      (Blank rows = not tested)  LOWER EXTREMITY SPECIAL TESTS:  Did not assess  FUNCTIONAL TESTS:  5 times sit to stand: 25.19 sec with UEs Tandem stance: 29.81 sec on R LE; 37.16 sec on L LE; SLS: 2 sec on R, 3 sec on L  04/19/23 5 times sit to stand: 16.2 sec with UEs  GAIT: Distance walked: 100' Assistive device utilized: None Level of assistance: Complete Independence Comments: R knee slightly flexed throughout gait, decreased knee flexion  during swing and decreased knee extension during heel strike   TODAY'S TREATMENT:   [facility] Adult PT Treatment:                                                DATE: 04/19/23 Therapeutic Exercise: Recumbent bike L2 x 5 min Prone knee ext hang x1 min Prone knee ext + quad set 10x3 sec Prone hamstring curl 2x10 Wall squat 2x10 Deep lunge with UE support x 5 Therapeutic Activity: Floor to stand transfer x 5 Rechecking goals   [facility] Adult PT Treatment:                                                DATE: 04/16/2023 Therapeutic Exercise: Recumbent bike L2 x 5 min Passive leg extension hang x 2 min Wall squats 2x10 Standing HS on step x 30" Standing hip abd 2x12 GTB Standing hip ext 2x10 GTB Eccentric heel raises on 4" step 2x10 Step  down  4" step 2x10 HHAx2 DL leg press 16# X09    [facility] Adult PT Treatment:                                                DATE: 04/12/2023 Therapeutic Exercise: Recumbent bike L1 x + subjective intake Knee measurements (see above) HS/ITB stretches w/strap Leg press DL 60# A54 Prone passive knee extension hang x 2 min Long-distance walking: 80'x13L Gastroc/soleus stretches on slantboard x30" each   PATIENT EDUCATION:  Education details: Exam findings, POC, initial HEP Person educated: Patient Education method: Explanation, Demonstration, and Handouts Education comprehension: verbalized understanding, returned demonstration, and needs further education  HOME EXERCISE PROGRAM: Access Code: [email or web address] URL: [email or web address] Date: 03/13/2023 Prepared by: Vernon Prey April Kirstie Peri  Exercises - Seated Hamstring Stretch  - 1 x daily - 7 x weekly - 2 sets - 30 sec hold - Seated Knee Extension with Resistance  - 1 x daily - 7 x weekly - 2 sets - 10 reps - Seated Hamstring Curls with Resistance  - 1 x daily - 7 x weekly - 2 sets - 10 reps - Hip Abduction with Resistance Loop  - 1 x daily - 7 x weekly - 2 sets - 10 reps - Hip Extension with Resistance Loop  - 1 x daily - 7 x weekly - 2 sets - 10 reps - Quad Setting and Stretching  - 1 x daily - 7 x weekly - 2 sets - 10 reps - 3 sec hold  ASSESSMENT:  CLINICAL IMPRESSION: Rechecked pt's goals -- she has met or partially met her LTGs at this time. Pt is more limited due to her L knee pain. Greatest deficit is strength with deeper ranges of knee flexion (I.e. 80 deg or more). Worked on squats and lunges (with UE support) to continue to address this. Pt has full ROM at this time and feels confident in performing her own strengthening independently. Pt feels ready for PT d/c.    OBJECTIVE IMPAIRMENTS: Abnormal gait, decreased activity tolerance, decreased balance, decreased endurance, decreased mobility, difficulty walking,  decreased ROM, decreased strength, hypomobility, increased edema, increased fascial restrictions, impaired flexibility, improper body mechanics, postural dysfunction, and pain.    GOALS: Goals reviewed with patient? Yes  SHORT TERM GOALS: Target date: 04/10/2023  Pt will be ind with initial HEP Goal status: MET  2.  Pt will be able to ascend/descend stairs with normal reciprocal gait Goal status: MET  LONG TERM GOALS: Target date: 05/08/2023  Pt will be ind with progression and advancement of HEP Baseline:  Goal status: MET  2.  Pt will have improved R knee ROM from 0 to 115 deg Baseline:  04/19/23: 0 -120 Goal status: MET  3.  Pt will have improved 5x STS to </=15 sec without UE support to demo increased functional LE strength Baseline: 25.19 sec with UE support 04/19/23: 16.2 sec with UE support Goal status: PARTIALLY MET  4.  Pt will be able to amb >/=1000' ind for community mobility Baseline:  04/19/23: Reports able to go grocery shopping and walk >2 blocks to watch her grandson's play Goal status: MET  5.  Pt will have improved LEFS to at least 81% to demo MCID Baseline: 71.3% 04/19/23: 78.8% Goal status: PARTIALLY MET   PLAN:  PT FREQUENCY: 2x/week  PT DURATION: 8 weeks  PLANNED INTERVENTIONS:  Therapeutic exercises, Therapeutic activity, Neuromuscular re-education, Balance training, Gait training, Patient/Family education, Self Care, Joint mobilization, Stair training, Aquatic Therapy, Dry Needling, Electrical stimulation, Cryotherapy, Moist heat, scar mobilization, Taping, Vasopneumatic device, Ionotophoresis 4mg /ml Dexamethasone, Manual therapy, and Re-evaluation  PLAN FOR NEXT SESSION: Progress gross LE strengthening as tolerated. Work on knee ROM as able. Leg press/machine strengthening. Stairs/step ups and downs   Casady Voshell April Randalyn Rhea J.F. Villareal, PT 04/19/2023 11:46 AM

## 2023-05-21 ENCOUNTER — Ambulatory Visit (INDEPENDENT_AMBULATORY_CARE_PROVIDER_SITE_OTHER): Payer: Medicare HMO | Admitting: Family Medicine

## 2023-05-21 VITALS — Wt 180.0 lb

## 2023-05-21 DIAGNOSIS — Z0000 Encounter for general adult medical examination without abnormal findings: Secondary | ICD-10-CM

## 2023-05-21 NOTE — Progress Notes (Signed)
MEDICARE ANNUAL WELLNESS VISIT  05/21/2023  Telephone Visit Disclaimer This Medicare AWV was conducted by telephone due to national recommendations for restrictions regarding the COVID-19 Pandemic (e.g. social distancing).  I verified, using two identifiers, that I am speaking with Alicia Herrera or their authorized healthcare agent. I discussed the limitations, risks, security, and privacy concerns of performing an evaluation and management service by telephone and the potential availability of an in-person appointment in the future. The patient expressed understanding and agreed to proceed.  Location of Patient: Home Location of Provider (nurse):  Provider home  Subjective:    Alicia Herrera is a 77 y.o. female patient of [name], DO who had a Medicare Annual Wellness Visit today via telephone. [name] is working part-time and lives alone. she has 4 children. she reports that she is socially active and does interact with friends/family regularly. she is moderately physically active and enjoys spending time with the grandchildren.  Patient Care Team: [name], DO as PCP - General (Family Medicine)     05/21/2023    1:55 PM 03/13/2023    1:20 PM 02/28/2023    4:14 AM 02/19/2023    5:47 AM 02/12/2023   10:01 AM 05/15/2022    3:01 PM 01/17/2021    4:05 PM  Advanced Directives  Does Patient Have a Medical Advance Directive? Yes Yes Yes Yes No;Yes Yes Yes  Type of Estate agent of [place];Living will Healthcare Power of [place];Living will Healthcare Power of [place];Living will Healthcare Power of [place];Living will Healthcare Power of [place];Living will Living will Healthcare Power of [place];Living will  Does patient want to make changes to medical advance directive? No - Patient declined No - Patient declined No - Patient declined No - Patient declined  No - Patient declined No - Patient declined  Copy of Healthcare Power of Attorney in Chart? No - copy  requested No - copy requested No - copy requested No - copy requested   No - copy requested    Hospital Utilization Over the Past 12 Months: # of hospitalizations or ER visits: 2 # of surgeries: 1  Review of Systems    Patient reports that her overall health is better compared to last year.  History obtained from chart review and the patient  Patient Reported Readings (BP, Pulse, CBG, Weight, etc) Weight: 180 lb  Pain Assessment Pain : No/denies pain     Current Medications & Allergies (verified) Allergies as of 05/21/2023   No Known Allergies      Medication List        Accurate as of May 21, 2023  2:03 PM. If you have any questions, ask your nurse or doctor.          STOP taking these medications    docusate sodium 100 MG capsule Commonly known as: Colace   oxyCODONE 5 MG immediate release tablet Commonly known as: Roxicodone   tiZANidine 4 MG tablet Commonly known as: ZANAFLEX       TAKE these medications    amLODipine 5 MG tablet Commonly known as: NORVASC Take 1 tablet by mouth once daily   apixaban 5 MG Tabs tablet Commonly known as: ELIQUIS Take 1 tablet (5 mg total) by mouth 2 (two) times daily. What changed: Another medication with the same name was removed. Continue taking this medication, and follow the directions you see here.   Aspirin Low Dose 81 MG tablet Generic drug: aspirin EC Take 1 tablet (81 mg total) by mouth  daily. Swallow whole.   hydrochlorothiazide 12.5 MG tablet Commonly known as: HYDRODIURIL Take 1 tablet by mouth once daily   Repatha SureClick 140 MG/ML Soaj Generic drug: Evolocumab Inject 140 mg into the skin every 14 (fourteen) days.        History (reviewed): Past Medical History:  Diagnosis Date   Arthritis    CHF (congestive heart failure) (HCC) 2014   Coronary artery disease    Heart murmur    Hx of CABG    Hyperlipidemia 2014   Hypertension    Hypothyroidism    hx of 25 years ago per pt    Rectocele    Uterine prolapse    Past Surgical History:  Procedure Laterality Date   CARDIAC VALVE REPLACEMENT     AVR   CORONARY ARTERY BYPASS GRAFT     JOINT REPLACEMENT  02/19/23   prolapsed uterus surgery      TOTAL KNEE ARTHROPLASTY Right 02/19/2023   Procedure: TOTAL KNEE ARTHROPLASTY;  Surgeon: Jodi Geralds, MD;  Location: [facility];  Service: Orthopedics;  Laterality: Right;   TUBAL LIGATION  1978   Family History  Problem Relation Age of Onset   Hypertension Mother    Heart attack Mother    Birth defects Mother    Cancer Father    Penile cancer Father    Diabetes Sister    Varicose Veins Maternal Grandmother    Diabetes Brother    Varicose Veins Maternal Aunt    Social History   Socioeconomic History   Marital status: Widowed    Spouse name: Not on file   Number of children: 4   Years of education: 17   Highest education level: Bachelor's degree (e.g., BA, AB, BS)  Occupational History   Occupation: Retired   Occupation: Works part time    Comment: 3  mornings at the preschool   Occupation: Words part-time at [employer]  Tobacco Use   Smoking status: Never   Smokeless tobacco: Never   Tobacco comments:    father smoked  Vaping Use   Vaping Use: Never used  Substance and Sexual Activity   Alcohol use: Not Currently    Alcohol/week: 1.0 standard drink of alcohol    Types: 1 Glasses of wine per week    Comment: Occasionally   Drug use: Never   Sexual activity: Not Currently    Birth control/protection: Abstinence    Comment: widowed  Other Topics Concern   Not on file  Social History Narrative   Lives alone but two of her children live close by. She watches her grandkids everyday and sees her family every day. She also has a puppy. She has not been able to exercise to due her knees.    Social Determinants of Health   Financial Resource Strain: Low Risk  (05/18/2023)   Overall Financial Resource Strain (CARDIA)    Difficulty of Paying Living Expenses: Not  hard at all  Food Insecurity: No Food Insecurity (05/18/2023)   Hunger Vital Sign    Worried About Running Out of Food in the Last Year: Never true    Ran Out of Food in the Last Year: Never true  Transportation Needs: No Transportation Needs (05/18/2023)   PRAPARE - Administrator, Civil Service (Medical): No    Lack of Transportation (Non-Medical): No  Physical Activity: Insufficiently Active (05/18/2023)   Exercise Vital Sign    Days of Exercise per Week: 1 day    Minutes of Exercise per Session: 10  min  Stress: No Stress Concern Present (05/18/2023)   [employer] of Occupational Health - Occupational Stress Questionnaire    Feeling of Stress : Not at all  Social Connections: Moderately Integrated (05/21/2023)   Social Connection and Isolation Panel [NHANES]    Frequency of Communication with Friends and Family: Three times a week    Frequency of Social Gatherings with Friends and Family: Three times a week    Attends Religious Services: More than 4 times per year    Active Member of Clubs or Organizations: Yes    Attends Banker Meetings: More than 4 times per year    Marital Status: Widowed    Activities of Daily Living    05/18/2023    2:49 PM 02/28/2023    4:14 AM  In your present state of health, do you have any difficulty performing the following activities:  Hearing? 0   Vision? 0   Difficulty concentrating or making decisions? 0   Walking or climbing stairs? 0   Dressing or bathing? 0   Doing errands, shopping? 0 0  Preparing Food and eating ? N   Using the Toilet? N   In the past six months, have you accidently leaked urine? Y   Do you have problems with loss of bowel control? N   Managing your Medications? N   Managing your Finances? N   Housekeeping or managing your Housekeeping? N     Patient Education/ Literacy How often do you need to have someone help you when you read instructions, pamphlets, or other written materials from your  doctor or pharmacy?: 1 - Never What is the last grade level you completed in school?: Bachelor's degree  Exercise    Diet Patient reports consuming 3 meals a day and 1 snack(s) a day Patient reports that her primary diet is: Regular Patient reports that she does have regular access to food.   Depression Screen    05/21/2023    1:50 PM 11/20/2022    8:26 AM 05/18/2022    8:26 AM 05/15/2022    3:04 PM 11/18/2021    8:55 AM 05/18/2021    9:22 AM 01/17/2021    4:06 PM  PHQ 2/9 Scores  PHQ - 2 Score 0 0 1 0 0 0 0  PHQ- 9 Score       0     Fall Risk    05/21/2023    1:50 PM 05/18/2023    2:49 PM 11/20/2022    8:25 AM 05/18/2022    8:26 AM 05/14/2022    3:43 PM  Fall Risk   Falls in the past year? 0 0 0 0 1  Number falls in past yr: 0 0 0 0 0  Injury with Fall? 0 0 0 0 0  Risk for fall due to : No Fall Risks  No Fall Risks No Fall Risks   Follow up Falls evaluation completed  Falls evaluation completed Falls evaluation completed      Objective:  Tanyetta Quimby seemed alert and oriented and she participated appropriately during our telephone visit.  Blood Pressure Weight BMI  BP Readings from Last 3 Encounters:  03/06/23 119/70  03/01/23 126/66  02/19/23 122/69   Wt Readings from Last 3 Encounters:  05/21/23 180 lb (81.6 kg)  03/06/23 177 lb (80.3 kg)  02/27/23 180 lb (81.6 kg)   BMI Readings from Last 1 Encounters:  05/21/23 28.19 kg/m    *Unable to obtain current vital signs, weight, and  BMI due to telephone visit type  Hearing/Vision  Maniyah did not seem to have difficulty with hearing/understanding during the telephone conversation Reports that she has had a formal eye exam by an eye care professional within the past year Reports that she has not had a formal hearing evaluation within the past year *Unable to fully assess hearing and vision during telephone visit type  Cognitive Function:    05/21/2023    1:56 PM 05/15/2022    3:09 PM 01/17/2021    4:21 PM  6CIT Screen  What  Year? 0 points 0 points 0 points  What month? 0 points 0 points 0 points  What time? 0 points 0 points 0 points  Count back from 20 0 points 0 points 0 points  Months in reverse 0 points 0 points 0 points  Repeat phrase 0 points 0 points 0 points  Total Score 0 points 0 points 0 points   (Normal:0-7, Significant for Dysfunction: >8)  Normal Cognitive Function Screening: Yes   Immunization & Health Maintenance Record Immunization History  Administered Date(s) Administered   PFIZER(Purple Top)SARS-COV-2 Vaccination 01/14/2020, 02/13/2020    Health Maintenance  Topic Date Due   Zoster Vaccines- Shingrix (1 of 2) 06/05/2023 (Originally 01/20/1995)   COVID-19 Vaccine (3 - 2023-24 season) 06/06/2023 (Originally 07/14/2022)   Pneumonia Vaccine 49+ Years old (1 of 1 - PCV) 03/05/2024 (Originally 01/19/2010)   DEXA SCAN  03/05/2024 (Originally 01/19/2010)   Hepatitis C Screening  05/20/2024 (Originally 01/20/1964)   INFLUENZA VACCINE  06/14/2023   Medicare Annual Wellness (AWV)  05/20/2024   HPV VACCINES  Aged Out   DTaP/Tdap/Td  Discontinued       Assessment  This is a routine wellness examination for [employer].  Health Maintenance: Due or Overdue There are no preventive care reminders to display for this patient.   Alicia Herrera does not need a referral for [employer] Assistance: Care Management:   no Social Work:    no Prescription Assistance:  no Nutrition/Diabetes Education:  no   Plan:  Personalized Goals  Goals Addressed               This Visit's Progress     Patient Stated (pt-stated)        Patient stated that she would like to continue to maintain her current lifestyle and be active and independent.       Personalized Health Maintenance & Screening Recommendations  Pneumococcal vaccine  Shingrix vaccine Bone density scan   Discussed vaccine and bone density scan with patient and she declined them at this time.   Lung Cancer Screening Recommended:  no (Low Dose CT Chest recommended if Age 39-80 years, 20 pack-year currently smoking OR have quit w/in past 15 years) Hepatitis C Screening recommended: yes HIV Screening recommended: no  Advanced Directives: Written information was not prepared per patient's request.  Referrals & Orders No orders of the defined types were placed in this encounter.   Follow-up Plan Follow-up with [name], DO as planned Medicare wellness visit in one year.  Patient will access AVS on my chart.   I have personally reviewed and noted the following in the patient's chart:   Medical and social history Use of alcohol, tobacco or illicit drugs  Current medications and supplements Functional ability and status Nutritional status Physical activity Advanced directives List of other physicians Hospitalizations, surgeries, and ER visits in previous 12 months Vitals Screenings to include cognitive, depression, and falls Referrals and appointments  In addition, I  have reviewed and discussed with Alicia Herrera certain preventive protocols, quality metrics, and best practice recommendations. A written personalized care plan for preventive services as well as general preventive health recommendations is available and can be mailed to the patient at her request.      Modesto Charon, RN BSN  05/21/2023

## 2023-05-21 NOTE — Patient Instructions (Addendum)
MEDICARE ANNUAL WELLNESS VISIT Health Maintenance Summary and Written Plan of Care  [name] ,  Thank you for allowing me to perform your Medicare Annual Wellness Visit and for your ongoing commitment to your health.   Health Maintenance & Immunization History Health Maintenance  Topic Date Due   Zoster Vaccines- Shingrix (1 of 2) 06/05/2023 (Originally 01/20/1995)   COVID-19 Vaccine (3 - 2023-24 season) 06/06/2023 (Originally 07/14/2022)   Pneumonia Vaccine 20+ Years old (1 of 1 - PCV) 03/05/2024 (Originally 01/19/2010)   DEXA SCAN  03/05/2024 (Originally 01/19/2010)   Hepatitis C Screening  05/20/2024 (Originally 01/20/1964)   INFLUENZA VACCINE  06/14/2023   Medicare Annual Wellness (AWV)  05/20/2024   HPV VACCINES  Aged Out   DTaP/Tdap/Td  Discontinued   Immunization History  Administered Date(s) Administered   PFIZER(Purple Top)SARS-COV-2 Vaccination 01/14/2020, 02/13/2020    These are the patient goals that we discussed:  Goals Addressed               This Visit's Progress     Patient Stated (pt-stated)        Patient stated that she would like to continue to maintain her current lifestyle and be active and independent.         This is a list of Health Maintenance Items that are overdue or due now: Pneumococcal vaccine  Shingrix vaccine Bone density scan   Discussed vaccine and bone density scan with patient and she declined them at this time.   Orders/Referrals Placed Today: No orders of the defined types were placed in this encounter.  (Contact our referral department at [phone] if you have not spoken with someone about your referral appointment within the next 5 days)    Follow-up Plan Follow-up with [name], DO as planned Medicare wellness visit in one year.  Patient will access AVS on my chart.      Health Maintenance, Female Adopting a healthy lifestyle and getting preventive care are important in promoting health and wellness. Ask  your health care provider about: The right schedule for you to have regular tests and exams. Things you can do on your own to prevent diseases and keep yourself healthy. What should I know about diet, weight, and exercise? Eat a healthy diet  Eat a diet that includes plenty of vegetables, fruits, low-fat dairy products, and lean protein. Do not eat a lot of foods that are high in solid fats, added sugars, or sodium. Maintain a healthy weight Body mass index (BMI) is used to identify weight problems. It estimates body fat based on height and weight. Your health care provider can help determine your BMI and help you achieve or maintain a healthy weight. Get regular exercise Get regular exercise. This is one of the most important things you can do for your health. Most adults should: Exercise for at least 150 minutes each week. The exercise should increase your heart rate and make you sweat (moderate-intensity exercise). Do strengthening exercises at least twice a week. This is in addition to the moderate-intensity exercise. Spend less time sitting. Even light physical activity can be beneficial. Watch cholesterol and blood lipids Have your blood tested for lipids and cholesterol at 78 years of age, then have this test every 5 years. Have your cholesterol levels checked more often if: Your lipid or cholesterol levels are high. You are older than 78 years of age. You are at high risk for heart disease. What should I know about cancer screening? Depending on your health  history and family history, you may need to have cancer screening at various ages. This may include screening for: Breast cancer. Cervical cancer. Colorectal cancer. Skin cancer. Lung cancer. What should I know about heart disease, diabetes, and high blood pressure? Blood pressure and heart disease High blood pressure causes heart disease and increases the risk of stroke. This is more likely to develop in people who have high  blood pressure readings or are overweight. Have your blood pressure checked: Every 3-5 years if you are 21-56 years of age. Every year if you are 77 years old or older. Diabetes Have regular diabetes screenings. This checks your fasting blood sugar level. Have the screening done: Once every three years after age 55 if you are at a normal weight and have a low risk for diabetes. More often and at a younger age if you are overweight or have a high risk for diabetes. What should I know about preventing infection? Hepatitis B If you have a higher risk for hepatitis B, you should be screened for this virus. Talk with your health care provider to find out if you are at risk for hepatitis B infection. Hepatitis C Testing is recommended for: Everyone born from [birth year] through 1966. Anyone with known risk factors for hepatitis C. Sexually transmitted infections (STIs) Get screened for STIs, including gonorrhea and chlamydia, if: You are sexually active and are younger than 78 years of age. You are older than 78 years of age and your health care provider tells you that you are at risk for this type of infection. Your sexual activity has changed since you were last screened, and you are at increased risk for chlamydia or gonorrhea. Ask your health care provider if you are at risk. Ask your health care provider about whether you are at high risk for HIV. Your health care provider may recommend a prescription medicine to help prevent HIV infection. If you choose to take medicine to prevent HIV, you should first get tested for HIV. You should then be tested every 3 months for as long as you are taking the medicine. Pregnancy If you are about to stop having your period (premenopausal) and you may become pregnant, seek counseling before you get pregnant. Take 400 to 800 micrograms (mcg) of folic acid every day if you become pregnant. Ask for birth control (contraception) if you want to prevent  pregnancy. Osteoporosis and menopause Osteoporosis is a disease in which the bones lose minerals and strength with aging. This can result in bone fractures. If you are 49 years old or older, or if you are at risk for osteoporosis and fractures, ask your health care provider if you should: Be screened for bone loss. Take a calcium or vitamin D supplement to lower your risk of fractures. Be given hormone replacement therapy (HRT) to treat symptoms of menopause. Follow these instructions at home: Alcohol use Do not drink alcohol if: Your health care provider tells you not to drink. You are pregnant, may be pregnant, or are planning to become pregnant. If you drink alcohol: Limit how much you have to: 0-1 drink a day. Know how much alcohol is in your drink. In the [place] one drink equals one 12 oz bottle of beer (355 mL), one 5 oz glass of wine (148 mL), or one 1 oz glass of hard liquor (44 mL). Lifestyle Do not use any products that contain nicotine or tobacco. These products include cigarettes, chewing tobacco, and vaping devices, such as e-cigarettes. If you need  help quitting, ask your health care provider. Do not use street drugs. Do not share needles. Ask your health care provider for help if you need support or information about quitting drugs. General instructions Schedule regular health, dental, and eye exams. Stay current with your vaccines. Tell your health care provider if: You often feel depressed. You have ever been abused or do not feel safe at home. Summary Adopting a healthy lifestyle and getting preventive care are important in promoting health and wellness. Follow your health care provider's instructions about healthy diet, exercising, and getting tested or screened for diseases. Follow your health care provider's instructions on monitoring your cholesterol and blood pressure. This information is not intended to replace advice given to you by your health care provider.  Make sure you discuss any questions you have with your health care provider. Document Revised: 03/21/2021 Document Reviewed: 03/21/2021 Elsevier Patient Education  2024 [employer].

## 2023-05-24 ENCOUNTER — Other Ambulatory Visit: Payer: Self-pay | Admitting: Family Medicine

## 2023-06-05 ENCOUNTER — Ambulatory Visit: Payer: Medicare HMO | Admitting: Family Medicine

## 2023-06-07 ENCOUNTER — Ambulatory Visit (INDEPENDENT_AMBULATORY_CARE_PROVIDER_SITE_OTHER): Payer: Medicare HMO | Admitting: Family Medicine

## 2023-06-07 ENCOUNTER — Encounter: Payer: Self-pay | Admitting: Family Medicine

## 2023-06-07 VITALS — BP 129/82 | HR 88 | Ht 67.0 in | Wt 183.0 lb

## 2023-06-07 DIAGNOSIS — I10 Essential (primary) hypertension: Secondary | ICD-10-CM

## 2023-06-07 DIAGNOSIS — I2609 Other pulmonary embolism with acute cor pulmonale: Secondary | ICD-10-CM | POA: Diagnosis not present

## 2023-06-07 NOTE — Progress Notes (Signed)
Alicia Herrera - 77 y.o. female MRN [id number]  Date of birth: [birth year]  Subjective Chief Complaint  Patient presents with   Follow-up    HPI Alicia Herrera is a 77 y.o. female here today for follow up.  She had DVT as well as pulmonary embolism after having knee surgery in April.  She has been on Eliquis for 3 months.  She has not had any symptoms including chest pain, dyspnea, fatigue or lower extremity edema.  She would like to discontinue Eliquis if possible due to this putting her in the donut hole with her insurance.  She does see a cardiologist in 2 months and they are planning to repeat her echocardiogram.  .ROS:  A comprehensive ROS was completed and negative except as noted per HPI   No Known Allergies  Past Medical History:  Diagnosis Date   Arthritis    CHF (congestive heart failure) (HCC) 2014   Coronary artery disease    Heart murmur    Hx of CABG    Hyperlipidemia 2014   Hypertension    Hypothyroidism    hx of 25 years ago per pt   Rectocele    Uterine prolapse     Past Surgical History:  Procedure Laterality Date   CARDIAC VALVE REPLACEMENT     AVR   CORONARY ARTERY BYPASS GRAFT     JOINT REPLACEMENT  02/19/23   prolapsed uterus surgery      TOTAL KNEE ARTHROPLASTY Right 02/19/2023   Procedure: TOTAL KNEE ARTHROPLASTY;  Surgeon: Jodi Geralds, MD;  Location: [facility];  Service: Orthopedics;  Laterality: Right;   TUBAL LIGATION  1978    Social History   Socioeconomic History   Marital status: Widowed    Spouse name: Not on file   Number of children: 4   Years of education: 17   Highest education level: Bachelor's degree (e.g., BA, AB, BS)  Occupational History   Occupation: Retired   Occupation: Works part time    Comment: 3  mornings at the preschool   Occupation: Words part-time at [employer]  Tobacco Use   Smoking status: Never   Smokeless tobacco: Never   Tobacco comments:    father smoked  Vaping Use   Vaping status: Never Used  Substance and  Sexual Activity   Alcohol use: Not Currently    Alcohol/week: 1.0 standard drink of alcohol    Types: 1 Glasses of wine per week    Comment: Occasionally   Drug use: Never   Sexual activity: Not Currently    Birth control/protection: Abstinence    Comment: widowed  Other Topics Concern   Not on file  Social History Narrative   Lives alone but two of her children live close by. She watches her grandkids everyday and sees her family every day. She also has a puppy. She has not been able to exercise to due her knees.    Social Determinants of Health   Financial Resource Strain: Low Risk  (05/18/2023)   Overall Financial Resource Strain (CARDIA)    Difficulty of Paying Living Expenses: Not hard at all  Food Insecurity: No Food Insecurity (05/18/2023)   Hunger Vital Sign    Worried About Running Out of Food in the Last Year: Never true    Ran Out of Food in the Last Year: Never true  Transportation Needs: No Transportation Needs (05/18/2023)   PRAPARE - Administrator, Civil Service (Medical): No    Lack of Transportation (Non-Medical): No  Physical Activity: Insufficiently Active (05/18/2023)   Exercise Vital Sign    Days of Exercise per Week: 1 day    Minutes of Exercise per Session: 10 min  Stress: No Stress Concern Present (05/18/2023)   [employer] of Occupational Health - Occupational Stress Questionnaire    Feeling of Stress : Not at all  Social Connections: Moderately Integrated (05/21/2023)   Social Connection and Isolation Panel [NHANES]    Frequency of Communication with Friends and Family: Three times a week    Frequency of Social Gatherings with Friends and Family: Three times a week    Attends Religious Services: More than 4 times per year    Active Member of Clubs or Organizations: Yes    Attends Banker Meetings: More than 4 times per year    Marital Status: Widowed    Family History  Problem Relation Age of Onset   Hypertension Mother     Heart attack Mother    Birth defects Mother    Cancer Father    Penile cancer Father    Diabetes Sister    Varicose Veins Maternal Grandmother    Diabetes Brother    Varicose Veins Maternal Aunt     Health Maintenance  Topic Date Due   COVID-19 Vaccine (3 - 2023-24 season) 08/23/2023 (Originally 07/14/2022)   Zoster Vaccines- Shingrix (1 of 2) 09/07/2023 (Originally 01/20/1995)   Pneumonia Vaccine 41+ Years old (1 of 1 - PCV) 03/05/2024 (Originally 01/19/2010)   DEXA SCAN  03/05/2024 (Originally 01/19/2010)   Hepatitis C Screening  05/20/2024 (Originally 01/20/1963)   INFLUENZA VACCINE  06/14/2023   Medicare Annual Wellness (AWV)  05/20/2024   HPV VACCINES  Aged Out   DTaP/Tdap/Td  Discontinued     ----------------------------------------------------------------------------------------------------------------------------------------------------------------------------------------------------------------- Physical Exam BP 129/82 (BP Location: Left Arm, Patient Position: Sitting, Cuff Size: Normal)   Pulse 88   Ht 5\' 7"  (1.702 m)   Wt 183 lb (83 kg)   SpO2 94%   BMI 28.66 kg/m   Physical Exam Constitutional:      Appearance: Normal appearance.  HENT:     Head: Normocephalic and atraumatic.  Eyes:     General: No scleral icterus. Cardiovascular:     Rate and Rhythm: Normal rate and regular rhythm.  Pulmonary:     Effort: Pulmonary effort is normal.     Breath sounds: Normal breath sounds.  Neurological:     Mental Status: She is alert.  Psychiatric:        Mood and Affect: Mood normal.        Behavior: Behavior normal.     ------------------------------------------------------------------------------------------------------------------------------------------------------------------------------------------------------------------- Assessment and Plan  Essential hypertension Pressure stable.  She will continue amlodipine and hydrochlorothiazide at current  strength.  Pulmonary embolism with acute cor pulmonale (HCC) She has been on Eliquis for 3 months.  She has an additional month supply left which she will complete.  Since this was a provoked episode after the surgery I think we can discontinue her anticoagulation when she completes this month.   No orders of the defined types were placed in this encounter.   No follow-ups on file.    This visit occurred during the SARS-CoV-2 public health emergency.  Safety protocols were in place, including screening questions prior to the visit, additional usage of staff PPE, and extensive cleaning of exam room while observing appropriate contact time as indicated for disinfecting solutions.

## 2023-06-07 NOTE — Assessment & Plan Note (Signed)
She has been on Eliquis for 3 months.  She has an additional month supply left which she will complete.  Since this was a provoked episode after the surgery I think we can discontinue her anticoagulation when she completes this month.

## 2023-06-07 NOTE — Assessment & Plan Note (Signed)
Pressure stable.  She will continue amlodipine and hydrochlorothiazide at current strength.

## 2023-06-07 NOTE — Patient Instructions (Addendum)
Finish up current prescription for Eliquis.  If you develop new swelling/chest pain/shortness of breath contact clinic or seek emergency care.

## 2023-06-14 DIAGNOSIS — M170 Bilateral primary osteoarthritis of knee: Secondary | ICD-10-CM | POA: Diagnosis not present

## 2023-07-19 ENCOUNTER — Ambulatory Visit (INDEPENDENT_AMBULATORY_CARE_PROVIDER_SITE_OTHER): Payer: Medicare HMO | Admitting: Family Medicine

## 2023-07-19 ENCOUNTER — Encounter: Payer: Self-pay | Admitting: Family Medicine

## 2023-07-19 VITALS — BP 113/71 | HR 86 | Ht 67.0 in | Wt 182.0 lb

## 2023-07-19 DIAGNOSIS — R42 Dizziness and giddiness: Secondary | ICD-10-CM | POA: Diagnosis not present

## 2023-07-19 DIAGNOSIS — R39198 Other difficulties with micturition: Secondary | ICD-10-CM

## 2023-07-19 DIAGNOSIS — R519 Headache, unspecified: Secondary | ICD-10-CM | POA: Diagnosis not present

## 2023-07-19 LAB — POCT URINALYSIS DIP (CLINITEK)
Bilirubin, UA: NEGATIVE
Blood, UA: NEGATIVE
Glucose, UA: NEGATIVE mg/dL
Ketones, POC UA: NEGATIVE mg/dL
Nitrite, UA: POSITIVE — AB
POC PROTEIN,UA: NEGATIVE
Spec Grav, UA: 1.02 (ref 1.010–1.025)
Urobilinogen, UA: 0.2 U/dL
pH, UA: 6 (ref 5.0–8.0)

## 2023-07-19 MED ORDER — CEPHALEXIN 500 MG PO CAPS: 500.0000 mg | ORAL_CAPSULE | Freq: Two times a day (BID) | ORAL | 0 refills | Status: AC

## 2023-07-19 MED ORDER — MECLIZINE HCL 25 MG PO TABS: 25.0000 mg | ORAL_TABLET | Freq: Three times a day (TID) | ORAL | 0 refills | Status: DC | PRN

## 2023-07-19 NOTE — Patient Instructions (Addendum)
Try meclizine for dizziness.  MRI has been ordered. Referral placed to physical therapy. If you have increasing headache or new weakness, speech difficulty or difficulty walking please contact EMS.   Vertigo Vertigo is the feeling that you or the things around you are moving when they are not. This feeling can come and go at any time. Vertigo often goes away on its own. This condition can be dangerous if it happens when you are doing activities like driving or working with machines. Your doctor will do tests to find the cause of your vertigo. These tests will also help your doctor decide on the best treatment for you. Follow these instructions at home: Eating and drinking     Drink enough fluid to keep your pee (urine) pale yellow. Do not drink alcohol. Activity Return to your normal activities when your doctor says that it is safe. In the morning, first sit up on the side of the bed. When you feel okay, stand slowly while you hold onto something until you know that your balance is fine. Move slowly. Avoid sudden body or head movements or certain positions, as told by your doctor. Use a cane if you have trouble standing or walking. Sit down right away if you feel dizzy. Avoid doing any tasks or activities that can cause danger to you or others if you get dizzy. Avoid bending down if you feel dizzy. Place items in your home so that they are easy for you to reach without bending or leaning over. Do not drive or use machinery if you feel dizzy. General instructions Take over-the-counter and prescription medicines only as told by your doctor. Keep all follow-up visits. Contact a doctor if: Your medicine does not help your vertigo. Your problems get worse or you have new symptoms. You have a fever. You feel like you may vomit (nauseous), or this feeling gets worse. You start to vomit. Your family or friends see changes in how you act. You lose feeling (have numbness) in part of your  body. You feel prickling and tingling in a part of your body. Get help right away if: You are always dizzy. You faint. You get very bad headaches. You get a stiff neck. Bright light starts to bother you. You have trouble moving or talking. You feel weak in your hands, arms, or legs. You have changes in your hearing or in how you see (vision). These symptoms may be an emergency. Get help right away. Call your local emergency services (911 in the [place] Do not wait to see if the symptoms will go away. Do not drive yourself to the hospital. Summary Vertigo is the feeling that you or the things around you are moving when they are not. Your doctor will do tests to find the cause of your vertigo. You may be told to avoid some tasks, positions, or movements. Contact a doctor if your medicine is not helping, or if you have a fever, new symptoms, or a change in how you act. Get help right away if you get very bad headaches, or if you have changes in how you speak, hear, or see. This information is not intended to replace advice given to you by your health care provider. Make sure you discuss any questions you have with your health care provider. Document Revised: 09/29/2020 Document Reviewed: 09/29/2020 Elsevier Patient Education  2024 [employer].

## 2023-07-19 NOTE — Progress Notes (Signed)
Alicia Herrera - 77 y.o. female MRN [id number]  Date of birth: 30-Jul-1946  Subjective Chief Complaint  Patient presents with    loss of balance    HPI Alicia Herrera is a 77 year old female here today with complaint of dizziness.  She reports that she woke up yesterday around 3:30 AM and noted feeling of dizziness on sitting up on the bed.  Describes this as "woozy feeling" and sensation of room spinning.  She has had some low back pain and urinary frequency.  She also feels pressure in the front and back of her head.  She states that she would describe this as a headache.  Blood pressure was elevated when she got up yesterday but has since returned to normal.  She denies ear pain or pressure, nausea, dizziness, slurred speech.  ROS:  A comprehensive ROS was completed and negative except as noted per HPI   Past Medical History:  Diagnosis Date   Arthritis    CHF (congestive heart failure) (HCC) 2014   Coronary artery disease    Heart murmur    Hx of CABG    Hyperlipidemia 2014   Hypertension    Hypothyroidism    hx of 25 years ago per pt   Rectocele    Uterine prolapse     Past Surgical History:  Procedure Laterality Date   CARDIAC VALVE REPLACEMENT     AVR   CORONARY ARTERY BYPASS GRAFT     JOINT REPLACEMENT  02/19/23   prolapsed uterus surgery      TOTAL KNEE ARTHROPLASTY Right 02/19/2023   Procedure: TOTAL KNEE ARTHROPLASTY;  Surgeon: Jodi Geralds, MD;  Location: [facility];  Service: Orthopedics;  Laterality: Right;   TUBAL LIGATION  1978    Social History   Socioeconomic History   Marital status: Widowed    Spouse name: Not on file   Number of children: 4   Years of education: 17   Highest education level: Bachelor's degree (e.g., BA, AB, BS)  Occupational History   Occupation: Retired   Occupation: Works part time    Comment: 3  mornings at the preschool   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 She watches her grandkids everyday and sees her family every day. She also has a puppy. She has not been able to exercise to due her knees.    Social Determinants of Health   Financial Resource Strain: Low Risk  (05/18/2023)   Overall Financial Resource Strain (CARDIA)    Difficulty of Paying Living Expenses: Not hard at all  Food Insecurity: No Food Insecurity (05/18/2023)   Hunger Vital Sign    Worried About Running Out of Food in the Last Year: Never true    Ran Out of Food in the Last Year: Never true  Transportation Needs: No Transportation Needs (05/18/2023)   PRAPARE - Transportation  Lack of Transportation (Medical): No    Lack of Transportation (Non-Medical): No  Physical Activity: Insufficiently Active (05/18/2023)   Exercise Vital Sign    Days of Exercise per Week: 1 day    Minutes of Exercise per Session: 10 min  Stress: No Stress Concern Present (05/18/2023)   [employer] of Occupational Health - Occupational Stress Questionnaire    Feeling of Stress : Not at all  Social Connections: Moderately Integrated (05/21/2023)   Social Connection and Isolation Panel [NHANES]    Frequency of Communication with Friends and Family: Three times a week    Frequency of Social Gatherings with Friends and Family: Three times a week    Attends Religious Services: More than 4 times per year    Active Member of Clubs or Organizations: Yes    Attends Banker Meetings: More than 4 times per year    Marital  Status: Widowed    Family History  Problem Relation Age of Onset   Hypertension Mother    Heart attack Mother    Birth defects Mother    Cancer Father    Penile cancer Father    Diabetes Sister    Varicose Veins Maternal Grandmother    Diabetes Brother    Varicose Veins Maternal Aunt     Health Maintenance  Topic Date Due   Zoster Vaccines- Shingrix (1 of 2) 09/07/2023 (Originally 01/20/1995)   INFLUENZA VACCINE  02/11/2024 (Originally 06/14/2023)   Pneumonia Vaccine 40+ Years old (1 of 1 - PCV) 03/05/2024 (Originally 01/19/2010)   DEXA SCAN  03/05/2024 (Originally 01/19/2010)   Hepatitis C Screening  05/20/2024 (Originally 01/20/1963)   COVID-19 Vaccine (3 - 2023-24 season) 08/03/2024 (Originally 07/15/2023)   Medicare Annual Wellness (AWV)  05/20/2024   HPV VACCINES  Aged Out   DTaP/Tdap/Td  Discontinued     ----------------------------------------------------------------------------------------------------------------------------------------------------------------------------------------------------------------- Physical Exam BP 113/71 (BP Location: Left Arm, Patient Position: Sitting, Cuff Size: Normal)   Pulse 86   Ht 5\' 7"  (1.702 m)   Wt 182 lb (82.6 kg)   SpO2 98%   BMI 28.51 kg/m   Physical Exam Constitutional:      Appearance: Normal appearance.  HENT:     Head: Normocephalic and atraumatic.     Right Ear: Tympanic membrane normal.     Left Ear: Tympanic membrane normal.  Eyes:     General: No scleral icterus. Cardiovascular:     Rate and Rhythm: Normal rate and regular rhythm.  Pulmonary:     Effort: Pulmonary effort is normal.     Breath sounds: Normal breath sounds.  Skin:    General: Skin is warm and dry.  Neurological:     General: No focal deficit present.     Mental Status: She is alert.     Cranial Nerves: No cranial nerve deficit.     Motor: No weakness.     Comments: Mild dizziness with laying back on [place]-Hallpike.  No nystagmus noted.   Psychiatric:        Mood and Affect: Mood normal.        Behavior: Behavior normal.     ------------------------------------------------------------------------------------------------------------------------------------------------------------------------------------------------------------------- Assessment and Plan  Vertigo Symptoms seem more vertiginous in nature.  Urinalysis consistent with urinary tract infection.  Will cover with Keflex.  Urine sent for culture.  No significant neurological deficits however if not improving with treatment of UTI or if worsening I would like to go ahead and obtain an MRI of her brain.   Meds ordered this encounter  Medications   meclizine (ANTIVERT) 25 MG tablet    Sig: Take 1 tablet (25 mg total) by mouth 3 (three) times daily as needed for dizziness.    Dispense:  30 tablet    Refill:  0   cephALEXin (KEFLEX) 500 MG capsule    Sig: Take 1 capsule (500 mg total) by mouth 2 (two) times daily for 7 days.    Dispense:  14 capsule    Refill:  0    No follow-ups on file.    This visit occurred during the SARS-CoV-2 public health emergency.  Safety protocols were in place, including screening questions prior to the visit, additional usage of staff PPE, and extensive cleaning of exam room while observing appropriate contact time as indicated for disinfecting solutions.

## 2023-07-19 NOTE — Assessment & Plan Note (Signed)
Symptoms seem more vertiginous in nature.  Urinalysis consistent with urinary tract infection.  Will cover with Keflex.  Urine sent for culture.  No significant neurological deficits however if not improving with treatment of UTI or if worsening I would like to go ahead and obtain an MRI of her brain.

## 2023-07-23 ENCOUNTER — Ambulatory Visit (INDEPENDENT_AMBULATORY_CARE_PROVIDER_SITE_OTHER): Payer: Medicare HMO

## 2023-07-23 DIAGNOSIS — K118 Other diseases of salivary glands: Secondary | ICD-10-CM | POA: Diagnosis not present

## 2023-07-23 DIAGNOSIS — R519 Headache, unspecified: Secondary | ICD-10-CM | POA: Diagnosis not present

## 2023-07-23 DIAGNOSIS — R42 Dizziness and giddiness: Secondary | ICD-10-CM | POA: Diagnosis not present

## 2023-07-23 DIAGNOSIS — I6782 Cerebral ischemia: Secondary | ICD-10-CM | POA: Diagnosis not present

## 2023-07-25 LAB — URINE CULTURE

## 2023-08-10 ENCOUNTER — Other Ambulatory Visit: Payer: Self-pay | Admitting: Family Medicine

## 2023-08-10 DIAGNOSIS — K118 Other diseases of salivary glands: Secondary | ICD-10-CM

## 2023-08-21 ENCOUNTER — Other Ambulatory Visit: Payer: Self-pay | Admitting: Family Medicine

## 2023-08-24 DIAGNOSIS — Z951 Presence of aortocoronary bypass graft: Secondary | ICD-10-CM | POA: Diagnosis not present

## 2023-08-24 DIAGNOSIS — I7121 Aneurysm of the ascending aorta, without rupture: Secondary | ICD-10-CM | POA: Diagnosis not present

## 2023-08-24 DIAGNOSIS — Z952 Presence of prosthetic heart valve: Secondary | ICD-10-CM | POA: Diagnosis not present

## 2023-08-24 DIAGNOSIS — Z8679 Personal history of other diseases of the circulatory system: Secondary | ICD-10-CM | POA: Diagnosis not present

## 2023-08-24 DIAGNOSIS — Z9889 Other specified postprocedural states: Secondary | ICD-10-CM | POA: Diagnosis not present

## 2023-08-24 DIAGNOSIS — E782 Mixed hyperlipidemia: Secondary | ICD-10-CM | POA: Diagnosis not present

## 2023-08-24 DIAGNOSIS — I10 Essential (primary) hypertension: Secondary | ICD-10-CM | POA: Diagnosis not present

## 2023-08-24 DIAGNOSIS — Z1330 Encounter for screening examination for mental health and behavioral disorders, unspecified: Secondary | ICD-10-CM | POA: Diagnosis not present

## 2023-08-24 DIAGNOSIS — I2510 Atherosclerotic heart disease of native coronary artery without angina pectoris: Secondary | ICD-10-CM | POA: Diagnosis not present

## 2023-08-27 ENCOUNTER — Encounter (INDEPENDENT_AMBULATORY_CARE_PROVIDER_SITE_OTHER): Payer: Self-pay | Admitting: Otolaryngology

## 2023-09-20 ENCOUNTER — Encounter (INDEPENDENT_AMBULATORY_CARE_PROVIDER_SITE_OTHER): Payer: Self-pay

## 2023-09-20 ENCOUNTER — Ambulatory Visit (INDEPENDENT_AMBULATORY_CARE_PROVIDER_SITE_OTHER): Payer: Medicare HMO | Admitting: Otolaryngology

## 2023-09-20 VITALS — Ht 67.0 in | Wt 182.0 lb

## 2023-09-20 DIAGNOSIS — I509 Heart failure, unspecified: Secondary | ICD-10-CM | POA: Diagnosis not present

## 2023-09-20 DIAGNOSIS — K118 Other diseases of salivary glands: Secondary | ICD-10-CM | POA: Diagnosis not present

## 2023-09-20 NOTE — Progress Notes (Signed)
Dear Dr. Ashley Royalty, Here is my assessment for our mutual patient, Alicia Herrera. Thank you for allowing me the opportunity to care for your patient. Please do not hesitate to contact me should you have any other questions. Sincerely, Dr. Jovita Kussmaul  Otolaryngology Clinic Note Referring provider: Dr. Ashley Royalty HPI:  Alicia Herrera is a 77 y.o. female kindly referred by Dr. Ashley Royalty for evaluation of left parotid mass.  Noticed it two years ago. Bigger now. Not painful. No numbness. No skin cancers. Had an MRI two months ago for vertigo (resolved), and noted to have parotid mass. Never been biopsied.  No prior H&N or skin cancers. No ear pain, no odynophagia, no dysphagia. No hemoptysis, neck masses.  On Evolocumab  H&N Surgery: No Personal or FHx of bleeding dz or anesthesia difficulty: no  GLP-1: no AP/AC: on ASA 325 (since heart surgery)  PMHx: HLD, HTN, CAD s/p CABG and ascending aortic aneurysm repair (2014), PE (April 2024 - post op after knee surgery)  Tobacco: no. Alcohol: no. Occupation: Work at [employer], pre-school for 14 years. Lives in [place], [place].  Independent Review of Additional Tests or Records:  MRI 07/23/2023:   No retrocochlear lesions  PMH/Meds/All/SocHx/FamHx/ROS:   Past Medical History:  Diagnosis Date   Arthritis    CHF (congestive heart failure) (HCC) 2014   Coronary artery disease    Heart murmur    Hx of CABG    Hyperlipidemia 2014   Hypertension    Hypothyroidism    hx of 25 years ago per pt   Rectocele    Uterine prolapse      Past Surgical History:  Procedure Laterality Date   CARDIAC VALVE REPLACEMENT     AVR   CORONARY ARTERY BYPASS GRAFT     JOINT REPLACEMENT  02/19/23   prolapsed uterus surgery      TOTAL KNEE ARTHROPLASTY Right 02/19/2023   Procedure: TOTAL KNEE ARTHROPLASTY;  Surgeon: Jodi Geralds, MD;  Location: [facility];  Service: Orthopedics;  Laterality: Right;   TUBAL LIGATION  1978    Family History  Problem  Relation Age of Onset   Hypertension Mother    Heart attack Mother    Birth defects Mother    Cancer Father    Penile cancer Father    Diabetes Sister    Varicose Veins Maternal Grandmother    Diabetes Brother    Varicose Veins Maternal Aunt      Social Connections: Socially Integrated (08/21/2023)   Received from [facility]   Social Network    How would you rate your social network (family, work, friends)?: Good participation with social networks      Current Outpatient Medications:    amLODipine (NORVASC) 5 MG tablet, Take 1 tablet by mouth once daily, Disp: 90 tablet, Rfl: 1   Evolocumab (REPATHA SURECLICK) 140 MG/ML SOAJ, Inject 140 mg into the skin every 14 (fourteen) days., Disp: , Rfl:    hydrochlorothiazide (HYDRODIURIL) 12.5 MG tablet, Take 1 tablet by mouth once daily, Disp: 90 tablet, Rfl: 1   meclizine (ANTIVERT) 25 MG tablet, Take 1 tablet (25 mg total) by mouth 3 (three) times daily as needed for dizziness. (Patient not taking: Reported on 09/20/2023), Disp: 30 tablet, Rfl: 0   Physical Exam:   Ht 5\' 7"  (1.702 m)   Wt 182 lb (82.6 kg)   BMI 28.51 kg/m   Salient findings:  CN II-XII intact  Bilateral EAC clear and TM intact with well pneumatized middle ear spaces Anterior rhinoscopy:  Septum relatively midline; bilateral inferior turbinates without significant hypertrophy No lesions of oral cavity/oropharynx; dentition relatively poor; able to express clear saliva both parotid ducts Left Tail of parotid (angle of mandible) mass, mobile, not fixed to overlying skin, about 2-2.5 cm. No overlying skin change. No skin lesions suspicious for cancer No obviously palpable neck masses/lymphadenopathy/thyromegaly No respiratory distress or stridor  Seprately Identifiable Procedures:  None  Impression & Plans:  Alicia Herrera is a 77 y.o. 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 MRI does not show any other masses but cannot assess neck Ddx includes benign etiologies (Warthin's, Pleomorphic) v/s malignant (skin met, other malignant parotid tumors). It appears to be benign. We discussed needle biopsy now v/s excision with OR frozen biopsies with neck dissection if malignant. She prefers the latter, which is reasonable Will obtain pre-op CT Neck to assess neck  Will schedule Will need anesthesia pre-op clearance Will need Cards clearance (Dr. Minna Merritts - cardiologist Kathryne Sharper)) - Ideally would hold ASA prior to surgery and then start ~POD 2  We discussed R/B/A of procedure, including pain/bleeding/infection, facial weakness or paralysis, numbness, first bite syndrome, gustatory sweating, among others. We discussed need for drain placement. Likely will stay overnight.   She is ready to proceed    Thank you for allowing me the opportunity to care for your patient. Please do not hesitate to contact me should you have any other questions.  Sincerely, Jovita Kussmaul, MD Otolarynoglogist (ENT), [facility] Phone: [phone] Fax: [phone]  09/20/2023, 2:29 PM   MDM:  Level 5 Complexity/Problems addressed: high - possible malignancy Data complexity: mod independent review of imaging and notes, results, and ordering CT - Morbidity: high - elective major surgery with risk factors (cards, aspirin)

## 2023-09-20 NOTE — Patient Instructions (Signed)
I have ordered an imaging study for you to complete prior to your next visit. Please call [facility] Scheduling at [phone] to schedule your imaging if you have not received a call within 24 hours. If you are unable to complete your imaging study prior to your next scheduled visit please call our office to let us know.   We will call you with the exact time for surgery/when your surgery would be.

## 2023-10-01 ENCOUNTER — Ambulatory Visit: Payer: Medicare HMO

## 2023-10-01 DIAGNOSIS — K118 Other diseases of salivary glands: Secondary | ICD-10-CM

## 2023-10-01 DIAGNOSIS — Z01818 Encounter for other preprocedural examination: Secondary | ICD-10-CM | POA: Diagnosis not present

## 2023-10-01 DIAGNOSIS — I6523 Occlusion and stenosis of bilateral carotid arteries: Secondary | ICD-10-CM | POA: Diagnosis not present

## 2023-10-01 DIAGNOSIS — I700 Atherosclerosis of aorta: Secondary | ICD-10-CM | POA: Diagnosis not present

## 2023-10-01 MED ORDER — IOHEXOL 300 MG/ML  SOLN
100.0000 mL | Freq: Once | INTRAMUSCULAR | Status: AC | PRN
  Administered 2023-10-01: 75 mL via INTRAVENOUS

## 2023-10-30 ENCOUNTER — Ambulatory Visit (INDEPENDENT_AMBULATORY_CARE_PROVIDER_SITE_OTHER): Payer: Medicare HMO | Admitting: Family Medicine

## 2023-10-30 ENCOUNTER — Encounter: Payer: Self-pay | Admitting: Family Medicine

## 2023-10-30 VITALS — BP 123/74 | HR 77 | Ht 67.0 in | Wt 186.0 lb

## 2023-10-30 DIAGNOSIS — I10 Essential (primary) hypertension: Secondary | ICD-10-CM

## 2023-10-30 DIAGNOSIS — Z01818 Encounter for other preprocedural examination: Secondary | ICD-10-CM | POA: Insufficient documentation

## 2023-10-30 DIAGNOSIS — Z951 Presence of aortocoronary bypass graft: Secondary | ICD-10-CM

## 2023-10-30 NOTE — Progress Notes (Signed)
Alicia Herrera - 77 y.o. female MRN [id number]  Date of birth: Apr 10, 1946  Subjective Chief Complaint  Patient presents with   Medical Clearance    HPI Alicia Herrera is a 77 y.o. female here today for pre-operative evaluation. She is planning to have parotidectomy.  Plan is for general anesthesia.  She denies complications form anesthesia in the past.   She does have history of AVR and CABG.  She has not had increased dyspnea, palpitations, or chest pain.  She is able to stay pretty active.  BP remains well controlled with hydrochlorothiazide and amlodipine.   ROS:  A comprehensive ROS was completed and negative except as noted per HPI  No Known Allergies  Past Medical History:  Diagnosis Date   Arthritis    CHF (congestive heart failure) (HCC) 2014   Coronary artery disease    Heart murmur    Hx of CABG    Hyperlipidemia 2014   Hypertension    Hypothyroidism    hx of 25 years ago per pt   Rectocele    Uterine prolapse     Past Surgical History:  Procedure Laterality Date   CARDIAC VALVE REPLACEMENT     AVR   CORONARY ARTERY BYPASS GRAFT     JOINT REPLACEMENT  02/19/23   prolapsed uterus surgery      TOTAL KNEE ARTHROPLASTY Right 02/19/2023   Procedure: TOTAL KNEE ARTHROPLASTY;  Surgeon: Jodi Geralds, MD;  Location: [facility];  Service: Orthopedics;  Laterality: Right;   TUBAL LIGATION  1978    Social History   Socioeconomic History   Marital status: Widowed    Spouse name: Not on file   Number of children: 4   Years of education: 17   Highest education level: Bachelor's degree (e.g., BA, AB, BS)  Occupational History   Occupation: Retired   Occupation: Works part time    Comment: 3  mornings at the preschool   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 She watches her grandkids everyday and sees her family every day. She also has a puppy. She has not been able to exercise to due her knees.    Social Drivers of Corporate investment banker Strain: Low Risk  (10/28/2023)   Overall Financial Resource Strain (CARDIA)    Difficulty of Paying Living Expenses: Not very hard  Food Insecurity: No Food Insecurity (10/28/2023)   Hunger Vital Sign    Worried About Running Out of Food in the Last Year: Never true    Ran Out of Food in the Last Year: Never true  Transportation Needs: No Transportation Needs (10/28/2023)   PRAPARE - Administrator, Civil Service (Medical): No    Lack of Transportation (Non-Medical): No  Physical Activity: Insufficiently Active (10/28/2023)   Exercise Vital Sign    Days of Exercise per Week: 2  days    Minutes of Exercise per Session: 10 min  Stress: No Stress Concern Present (10/28/2023)   [employer] of Occupational Health - Occupational Stress Questionnaire    Feeling of Stress : Not at all  Social Connections: Moderately Integrated (10/28/2023)   Social Connection and Isolation Panel [NHANES]    Frequency of Communication with Friends and Family: Twice a week    Frequency of Social Gatherings with Friends and Family: Once a week    Attends Religious Services: More than 4 times per year    Active Member of [employer] or Organizations: Yes    Attends Banker Meetings: More than 4 times per year    Marital Status: Widowed    Family History  Problem Relation Age of Onset   Hypertension Mother    Heart attack Mother    Birth defects Mother    Cancer  Father    Penile cancer Father    Diabetes Sister    Varicose Veins Maternal Grandmother    Diabetes Brother    Varicose Veins Maternal Aunt     Health Maintenance  Topic Date Due   Zoster Vaccines- Shingrix (1 of 2) Never done   INFLUENZA VACCINE  02/11/2024 (Originally 06/14/2023)   Pneumonia Vaccine 64+ Years old (1 of 2 - PCV) 03/05/2024 (Originally 01/20/1951)   DEXA SCAN  03/05/2024 (Originally 01/19/2010)   Hepatitis C Screening  05/20/2024 (Originally 01/20/1963)   COVID-19 Vaccine (3 - 2024-25 season) 08/03/2024 (Originally 07/15/2023)   Medicare Annual Wellness (AWV)  05/20/2024   HPV VACCINES  Aged Out   DTaP/Tdap/Td  Discontinued     ----------------------------------------------------------------------------------------------------------------------------------------------------------------------------------------------------------------- Physical Exam BP 123/74 (BP Location: Left Arm, Patient Position: Sitting, Cuff Size: Normal)   Pulse 77   Ht 5\' 7"  (1.702 m)   Wt 186 lb (84.4 kg)   SpO2 97%   BMI 29.13 kg/m   Physical Exam Constitutional:      Appearance: Normal appearance.  Eyes:     General: No scleral icterus. Cardiovascular:     Rate and Rhythm: Normal rate and regular rhythm.  Pulmonary:     Effort: Pulmonary effort is normal.     Breath sounds: Normal breath sounds.  Musculoskeletal:     Cervical back: Neck supple.  Neurological:     Mental Status: She is alert.  Psychiatric:        Mood and Affect: Mood normal.        Behavior: Behavior normal.     ------------------------------------------------------------------------------------------------------------------------------------------------------------------------------------------------------------------- Assessment and Plan  Pre-operative exam RCRI of 1.  Medically cleared to proceed with surgery. May hold ASA 1 week prior and resume the following day post-operatively.  EKG without changes and  denies new symptoms.  Updated labs ordered.     No orders of the defined types were placed in this encounter.   No follow-ups on file.    This visit occurred during the SARS-CoV-2 public health emergency.  Safety protocols were in place, including screening questions prior to the visit, additional usage of staff PPE, and extensive cleaning of exam room while observing appropriate contact time as indicated for disinfecting solutions.

## 2023-10-30 NOTE — Patient Instructions (Signed)
ENT [phone]

## 2023-10-30 NOTE — Assessment & Plan Note (Signed)
RCRI of 1.  Medically cleared to proceed with surgery. May hold ASA 1 week prior and resume the following day post-operatively.  EKG without changes and denies new symptoms.  Updated labs ordered.

## 2023-10-31 LAB — CMP14+EGFR
ALT: 12 [IU]/L (ref 0–32)
AST: 13 [IU]/L (ref 0–40)
Albumin: 4.3 g/dL (ref 3.8–4.8)
Alkaline Phosphatase: 74 [IU]/L (ref 44–121)
BUN/Creatinine Ratio: 19 (ref 12–28)
BUN: 17 mg/dL (ref 8–27)
Bilirubin Total: 0.2 mg/dL (ref 0.0–1.2)
CO2: 24 mmol/L (ref 20–29)
Calcium: 9.1 mg/dL (ref 8.7–10.3)
Chloride: 102 mmol/L (ref 96–106)
Creatinine, Ser: 0.88 mg/dL (ref 0.57–1.00)
Globulin, Total: 2.8 g/dL (ref 1.5–4.5)
Glucose: 113 mg/dL — ABNORMAL HIGH (ref 70–99)
Potassium: 3.7 mmol/L (ref 3.5–5.2)
Sodium: 140 mmol/L (ref 134–144)
Total Protein: 7.1 g/dL (ref 6.0–8.5)
eGFR: 67 mL/min/{1.73_m2} (ref >59)

## 2023-10-31 LAB — CBC WITH DIFFERENTIAL/PLATELET
Basophils Absolute: 0.1 10*3/uL (ref 0.0–0.2)
Basos: 1 %
EOS (ABSOLUTE): 0.1 10*3/uL (ref 0.0–0.4)
Eos: 3 %
Hematocrit: 38.9 % (ref 34.0–46.6)
Hemoglobin: 12.6 g/dL (ref 11.1–15.9)
Immature Grans (Abs): 0 10*3/uL (ref 0.0–0.1)
Immature Granulocytes: 0 %
Lymphocytes Absolute: 1.4 10*3/uL (ref 0.7–3.1)
Lymphs: 29 %
MCH: 27 pg (ref 26.6–33.0)
MCHC: 32.4 g/dL (ref 31.5–35.7)
MCV: 84 fL (ref 79–97)
Monocytes Absolute: 0.3 10*3/uL (ref 0.1–0.9)
Monocytes: 7 %
Neutrophils Absolute: 2.8 10*3/uL (ref 1.4–7.0)
Neutrophils: 60 %
Platelets: 249 10*3/uL (ref 150–450)
RBC: 4.66 x10E6/uL (ref 3.77–5.28)
RDW: 13.8 % (ref 11.7–15.4)
WBC: 4.7 10*3/uL (ref 3.4–10.8)

## 2023-11-19 ENCOUNTER — Other Ambulatory Visit: Payer: Self-pay | Admitting: Family Medicine

## 2023-11-22 ENCOUNTER — Encounter: Payer: Self-pay | Admitting: Family Medicine

## 2023-11-22 ENCOUNTER — Ambulatory Visit (INDEPENDENT_AMBULATORY_CARE_PROVIDER_SITE_OTHER): Payer: Medicare HMO | Admitting: Family Medicine

## 2023-11-22 VITALS — BP 123/73 | HR 70 | Ht 67.0 in | Wt 186.0 lb

## 2023-11-22 DIAGNOSIS — I10 Essential (primary) hypertension: Secondary | ICD-10-CM | POA: Diagnosis not present

## 2023-11-22 DIAGNOSIS — Z0000 Encounter for general adult medical examination without abnormal findings: Secondary | ICD-10-CM | POA: Diagnosis not present

## 2023-11-22 DIAGNOSIS — E785 Hyperlipidemia, unspecified: Secondary | ICD-10-CM

## 2023-11-22 MED ORDER — HYDROCHLOROTHIAZIDE 12.5 MG PO TABS: 12.5000 mg | ORAL_TABLET | Freq: Every day | ORAL | 1 refills | Status: DC

## 2023-11-22 NOTE — Addendum Note (Signed)
 Addended by: Mammie Lorenzo on: 11/22/2023 08:49 AM   Modules accepted: Orders

## 2023-11-22 NOTE — Assessment & Plan Note (Addendum)
 Well adult Orders Placed This Encounter  Procedures   Lipid Panel w/reflex Direct LDL  Screenings: per lab orders Immunizations:  Declines.  Understands risks/benefits of vaccination.  Anticipatory guidance/Risk factor reduction:  Recommendations per AVS.

## 2023-11-22 NOTE — Progress Notes (Signed)
 Alicia Herrera - 78 y.o. female MRN [id number]  Date of birth: [birth year]  Subjective Chief Complaint  Patient presents with   Annual Exam    HPI Alicia Herrera is a 78 y.o. here today for annual exam.   She reports that she is doing well.  She is supposed to have parotidectomy later this month.    Continues to do well from a cardiac standpoint since her AVR.    She is fairly active and feels that diet is ok.   She is a non-smoker.  Rare EtOH use.   Review of Systems  Constitutional:  Negative for chills, fever, malaise/fatigue and weight loss.  HENT:  Negative for congestion, ear pain and sore throat.   Eyes:  Negative for blurred vision, double vision and pain.  Respiratory:  Negative for cough and shortness of breath.   Cardiovascular:  Negative for chest pain and palpitations.  Gastrointestinal:  Negative for abdominal pain, blood in stool, constipation, heartburn and nausea.  Genitourinary:  Negative for dysuria and urgency.  Musculoskeletal:  Negative for joint pain and myalgias.  Neurological:  Negative for dizziness and headaches.  Endo/Heme/Allergies:  Does not bruise/bleed easily.  Psychiatric/Behavioral:  Negative for depression. The patient is not nervous/anxious and does not have insomnia.       No Known Allergies  Past Medical History:  Diagnosis Date   Arthritis    CHF (congestive heart failure) (HCC) 2014   Coronary artery disease    Heart murmur    Hx of CABG    Hyperlipidemia 2014   Hypertension    Hypothyroidism    hx of 25 years ago per pt   Rectocele    Uterine prolapse     Past Surgical History:  Procedure Laterality Date   CARDIAC VALVE REPLACEMENT     AVR   CORONARY ARTERY BYPASS GRAFT     JOINT REPLACEMENT  02/19/23   prolapsed uterus surgery      TOTAL KNEE ARTHROPLASTY Right 02/19/2023   Procedure: TOTAL KNEE ARTHROPLASTY;  Surgeon: Yvone Rush, MD;  Location: [facility];  Service: Orthopedics;  Laterality: Right;   TUBAL LIGATION   1978    Social History   Socioeconomic History   Marital status: Widowed    Spouse name: Not on file   Number of children: 4   Years of education: 17   Highest education level: Bachelor's degree (e.g., BA, AB, BS)  Occupational History   Occupation: Retired   Occupation: Works part time    Comment: 3  mornings at the preschool   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 She watches her grandkids everyday and sees her family every day. She also has a puppy. She has not been able to exercise to due her knees.    Social Drivers of Health   Financial Resource Strain: Low Risk  (10/28/2023)  Overall Financial Resource Strain (CARDIA)    Difficulty of Paying Living Expenses: Not very hard  Food Insecurity: No Food Insecurity (10/28/2023)   Hunger Vital Sign    Worried About Running Out of Food in the Last Year: Never true    Ran Out of Food in the Last Year: Never true  Transportation Needs: No Transportation Needs (10/28/2023)   PRAPARE - Administrator, Civil Service (Medical): No    Lack of Transportation (Non-Medical): No  Physical Activity: Insufficiently Active (10/28/2023)   Exercise Vital Sign    Days of Exercise per Week: 2 days    Minutes of Exercise per Session: 10 min  Stress: No Stress Concern Present (10/28/2023)   [employer] of Occupational Health - Occupational Stress Questionnaire    Feeling of Stress : Not at  all  Social Connections: Moderately Integrated (10/28/2023)   Social Connection and Isolation Panel [NHANES]    Frequency of Communication with Friends and Family: Twice a week    Frequency of Social Gatherings with Friends and Family: Once a week    Attends Religious Services: More than 4 times per year    Active Member of [employer] or Organizations: Yes    Attends Banker Meetings: More than 4 times per year    Marital Status: Widowed    Family History  Problem Relation Age of Onset   Hypertension Mother    Heart attack Mother    Birth defects Mother    Cancer Father    Penile cancer Father    Diabetes Sister    Varicose Veins Maternal Grandmother    Diabetes Brother    Varicose Veins Maternal Aunt     Health Maintenance  Topic Date Due   INFLUENZA VACCINE  02/11/2024 (Originally 06/14/2023)   Pneumonia Vaccine 40+ Years old (1 of 2 - PCV) 03/05/2024 (Originally [birth year]20/1951)   DEXA SCAN  03/05/2024 (Originally 01/19/2010)   Hepatitis C Screening  05/20/2024 (Originally 01/20/1963)   COVID-19 Vaccine (3 - 2024-25 season) 08/03/2024 (Originally 07/15/2023)   Zoster Vaccines- Shingrix (1 of 2) 11/21/2024 (Originally 01/20/1995)   Medicare Annual Wellness (AWV)  05/20/2024   HPV VACCINES  Aged Out   DTaP/Tdap/Td  Discontinued     ----------------------------------------------------------------------------------------------------------------------------------------------------------------------------------------------------------------- Physical Exam BP 123/73 (BP Location: Left Arm, Patient Position: Sitting, Cuff Size: Large)   Pulse 70   Ht 5' 7 (1.702 m)   Wt 186 lb (84.4 kg)   SpO2 99%   BMI 29.13 kg/m   Physical Exam Constitutional:      General: She is not in acute distress. HENT:     Head: Normocephalic and atraumatic.     Right Ear: Tympanic membrane and ear canal normal.     Left Ear: Tympanic membrane and ear canal normal.     Nose: Nose normal.  Eyes:      General: No scleral icterus.    Conjunctiva/sclera: Conjunctivae normal.  Neck:     Thyroid: No thyromegaly.  Cardiovascular:     Rate and Rhythm: Normal rate and regular rhythm.     Heart sounds: Normal heart sounds.  Pulmonary:     Effort: Pulmonary effort is normal.     Breath sounds: Normal breath sounds.  Abdominal:     General: Bowel sounds are normal. There is no distension.     Palpations: Abdomen is soft.     Tenderness: There is no abdominal tenderness. There is no guarding.  Musculoskeletal:        General:  Normal range of motion.     Cervical back: Normal range of motion and neck supple.  Lymphadenopathy:     Cervical: No cervical adenopathy.  Skin:    General: Skin is warm and dry.     Findings: No rash.  Neurological:     General: No focal deficit present.     Mental Status: She is alert and oriented to person, place, and time.     Cranial Nerves: No cranial nerve deficit.     Coordination: Coordination normal.  Psychiatric:        Mood and Affect: Mood normal.        Behavior: Behavior normal.     ------------------------------------------------------------------------------------------------------------------------------------------------------------------------------------------------------------------- Assessment and Plan  Essential hypertension Pressure stable.  She will continue amlodipine  and hydrochlorothiazide  at current strength.  Well adult exam Well adult No orders of the defined types were placed in this encounter. Screenings: per lab orders Immunizations:  Declines.  Understands risks/benefits of vaccination.  Anticipatory guidance/Risk factor reduction:  Recommendations per AVS.    No orders of the defined types were placed in this encounter.   No follow-ups on file.    This visit occurred during the SARS-CoV-2 public health emergency.  Safety protocols were in place, including screening questions prior to the visit, additional usage  of staff PPE, and extensive cleaning of exam room while observing appropriate contact time as indicated for disinfecting solutions.

## 2023-11-22 NOTE — Assessment & Plan Note (Signed)
Pressure stable.  She will continue amlodipine and hydrochlorothiazide at current strength.

## 2023-11-22 NOTE — Patient Instructions (Signed)
 Preventive Care 83 Years and Older, Female Preventive care refers to lifestyle choices and visits with your health care provider that can promote health and wellness. Preventive care visits are also called wellness exams. What can I expect for my preventive care visit? Counseling Your health care provider may ask you questions about your: Medical history, including: Past medical problems. Family medical history. Pregnancy and menstrual history. History of falls. Current health, including: Memory and ability to understand (cognition). Emotional well-being. Home life and relationship well-being. Sexual activity and sexual health. Lifestyle, including: Alcohol, nicotine or tobacco, and drug use. Access to firearms. Diet, exercise, and sleep habits. Work and work Astronomer. Sunscreen use. Safety issues such as seatbelt and bike helmet use. Physical exam Your health care provider will check your: Height and weight. These may be used to calculate your BMI (body mass index). BMI is a measurement that tells if you are at a healthy weight. Waist circumference. This measures the distance around your waistline. This measurement also tells if you are at a healthy weight and may help predict your risk of certain diseases, such as type 2 diabetes and high blood pressure. Heart rate and blood pressure. Body temperature. Skin for abnormal spots. What immunizations do I need?  Vaccines are usually given at various ages, according to a schedule. Your health care provider will recommend vaccines for you based on your age, medical history, and lifestyle or other factors, such as travel or where you work. What tests do I need? Screening Your health care provider may recommend screening tests for certain conditions. This may include: Lipid and cholesterol levels. Hepatitis C test. Hepatitis B test. HIV (human immunodeficiency virus) test. STI (sexually transmitted infection) testing, if you are at  risk. Lung cancer screening. Colorectal cancer screening. Diabetes screening. This is done by checking your blood sugar (glucose) after you have not eaten for a while (fasting). Mammogram. Talk with your health care provider about how often you should have regular mammograms. BRCA-related cancer screening. This may be done if you have a family history of breast, ovarian, tubal, or peritoneal cancers. Bone density scan. This is done to screen for osteoporosis. Talk with your health care provider about your test results, treatment options, and if necessary, the need for more tests. Follow these instructions at home: Eating and drinking  Eat a diet that includes fresh fruits and vegetables, whole grains, lean protein, and low-fat dairy products. Limit your intake of foods with high amounts of sugar, saturated fats, and salt. Take vitamin and mineral supplements as recommended by your health care provider. Do not drink alcohol if your health care provider tells you not to drink. If you drink alcohol: Limit how much you have to 0-1 drink a day. Know how much alcohol is in your drink. In the [place] one drink equals one 12 oz bottle of beer (355 mL), one 5 oz glass of wine (148 mL), or one 1 oz glass of hard liquor (44 mL). Lifestyle Brush your teeth every morning and night with fluoride toothpaste. Floss one time each day. Exercise for at least 30 minutes 5 or more days each week. Do not use any products that contain nicotine or tobacco. These products include cigarettes, chewing tobacco, and vaping devices, such as e-cigarettes. If you need help quitting, ask your health care provider. Do not use drugs. If you are sexually active, practice safe sex. Use a condom or other form of protection in order to prevent STIs. Take aspirin only as told by  your health care provider. Make sure that you understand how much to take and what form to take. Work with your health care provider to find out whether it  is safe and beneficial for you to take aspirin daily. Ask your health care provider if you need to take a cholesterol-lowering medicine (statin). Find healthy ways to manage stress, such as: Meditation, yoga, or listening to music. Journaling. Talking to a trusted person. Spending time with friends and family. Minimize exposure to UV radiation to reduce your risk of skin cancer. Safety Always wear your seat belt while driving or riding in a vehicle. Do not drive: If you have been drinking alcohol. Do not ride with someone who has been drinking. When you are tired or distracted. While texting. If you have been using any mind-altering substances or drugs. Wear a helmet and other protective equipment during sports activities. If you have firearms in your house, make sure you follow all gun safety procedures. What's next? Visit your health care provider once a year for an annual wellness visit. Ask your health care provider how often you should have your eyes and teeth checked. Stay up to date on all vaccines. This information is not intended to replace advice given to you by your health care provider. Make sure you discuss any questions you have with your health care provider. Document Revised: 04/27/2021 Document Reviewed: 04/27/2021 Elsevier Patient Education  2024 [employer].

## 2023-11-23 LAB — LIPID PANEL WITH LDL/HDL RATIO
Cholesterol, Total: 189 mg/dL (ref 100–199)
HDL: 52 mg/dL (ref >39)
LDL Chol Calc (NIH): 116 mg/dL — ABNORMAL HIGH (ref 0–99)
LDL/HDL Ratio: 2.2 {ratio} (ref 0.0–3.2)
Triglycerides: 116 mg/dL (ref 0–149)
VLDL Cholesterol Cal: 21 mg/dL (ref 5–40)

## 2023-11-28 ENCOUNTER — Telehealth (INDEPENDENT_AMBULATORY_CARE_PROVIDER_SITE_OTHER): Payer: Self-pay | Admitting: Otolaryngology

## 2023-11-28 NOTE — Telephone Encounter (Signed)
 Confirmed appt & location [id number]

## 2023-11-29 ENCOUNTER — Ambulatory Visit (INDEPENDENT_AMBULATORY_CARE_PROVIDER_SITE_OTHER): Payer: Medicare HMO

## 2023-11-29 ENCOUNTER — Encounter (INDEPENDENT_AMBULATORY_CARE_PROVIDER_SITE_OTHER): Payer: Self-pay

## 2023-11-29 VITALS — BP 130/80 | HR 67 | Ht 67.0 in | Wt 186.0 lb

## 2023-11-29 DIAGNOSIS — R229 Localized swelling, mass and lump, unspecified: Secondary | ICD-10-CM

## 2023-11-29 DIAGNOSIS — K115 Sialolithiasis: Secondary | ICD-10-CM

## 2023-11-29 DIAGNOSIS — K118 Other diseases of salivary glands: Secondary | ICD-10-CM | POA: Diagnosis not present

## 2023-11-29 NOTE — Progress Notes (Signed)
Dear Dr. Ashley Royalty, Here is my assessment for our mutual patient, Timberlee Crick. Thank you for allowing me the opportunity to care for your patient. Please do not hesitate to contact me should you have any other questions. Sincerely, Dr. Jovita Kussmaul  Otolaryngology Clinic Note Referring provider: Dr. Ashley Royalty HPI:  Alicia Herrera is a 78 y.o. female kindly referred by Dr. Ashley Royalty for evaluation of left parotid mass.  Initial visit (09/2023): Noticed it two years ago. Bigger now. Not painful. No numbness. No skin cancers. Had an MRI two months ago for vertigo (resolved), and noted to have parotid mass. Never been biopsied.  No prior H&N or skin cancers. No ear pain, no odynophagia, no dysphagia. No hemoptysis, neck masses.  On Evolocumab ------------------------------------------- 11/29/2023: Here to discuss CT. She had several questions I answered. Will have her hold her ASA 7 days before surgery. Will stay overnight. No numbness or facial pain. She feels as if the lesion is about the same Noted submandibular gland stones found icidentally. We discussed and she is not having any pain, flare ups related to submandibular gland.  --------------------------------------------  H&N Surgery: No Personal or FHx of bleeding dz or anesthesia difficulty: no  GLP-1: no AP/AC: on ASA 325 (since heart surgery)  PMHx: HLD, HTN, CAD s/p CABG and ascending aortic aneurysm repair (2014), PE (April 2024 - post op after knee surgery)  Tobacco: no. Alcohol: no. Occupation: Work at [employer], pre-school for 14 years. Lives in [place], [place].  Independent Review of Additional Tests or Records:  MRI 07/23/2023:   No retrocochlear lesions  CT 10/01/2023 indepenently reviewed - ~2.5 cm left parotid mass, no lymph nodes. Submandibular gland stones PMH/Meds/All/SocHx/FamHx/ROS:   Past Medical History:  Diagnosis Date   Arthritis    CHF (congestive heart failure) (HCC) 2014   Coronary artery  disease    Heart murmur    Hx of CABG    Hyperlipidemia 2014   Hypertension    Hypothyroidism    hx of 25 years ago per pt   Rectocele    Uterine prolapse      Past Surgical History:  Procedure Laterality Date   CARDIAC VALVE REPLACEMENT     AVR   CORONARY ARTERY BYPASS GRAFT     JOINT REPLACEMENT  02/19/23   prolapsed uterus surgery      TOTAL KNEE ARTHROPLASTY Right 02/19/2023   Procedure: TOTAL KNEE ARTHROPLASTY;  Surgeon: Jodi Geralds, MD;  Location: [facility];  Service: Orthopedics;  Laterality: Right;   TUBAL LIGATION  1978    Family History  Problem Relation Age of Onset   Hypertension Mother    Heart attack Mother    Birth defects Mother    Cancer Father    Penile cancer Father    Diabetes Sister    Varicose Veins Maternal Grandmother    Diabetes Brother    Varicose Veins Maternal Aunt      Social Connections: Moderately Integrated (10/28/2023)   Social Connection and Isolation Panel [NHANES]    Frequency of Communication with Friends and Family: Twice a week    Frequency of Social Gatherings with Friends and Family: Once a week    Attends Religious Services: More than 4 times per year    Active Member of [employer] or Organizations: Yes    Attends Banker Meetings: More than 4 times per year    Marital Status: Widowed      Current Outpatient Medications:    amLODipine (NORVASC) 5 MG tablet, Take 1  tablet by mouth once daily, Disp: 90 tablet, Rfl: 1   aspirin 325 MG tablet, Take 325 mg by mouth daily., Disp: , Rfl:    Evolocumab (REPATHA SURECLICK) 140 MG/ML SOAJ, Inject 140 mg into the skin every 14 (fourteen) days., Disp: , Rfl:    hydrochlorothiazide (HYDRODIURIL) 12.5 MG tablet, Take 1 tablet (12.5 mg total) by mouth daily., Disp: 90 tablet, Rfl: 1   Physical Exam:   There were no vitals taken for this visit.  Salient findings:  CN II-XII intact  Bilateral EAC clear and TM intact with well pneumatized middle ear spaces Anterior rhinoscopy:  Septum relatively midline; bilateral inferior turbinates without significant hypertrophy No lesions of oral cavity/oropharynx; dentition relatively poor; able to express clear saliva both parotid ducts Left Tail of parotid (angle of mandible) mass, mobile, not fixed to overlying skin, about 2-2.5 cm. No overlying skin change. No skin lesions suspicious for cancer - stable appearance. SMG and FOM soft b/l No obviously palpable neck masses/lymphadenopathy/thyromegaly No respiratory distress or stridor  Seprately Identifiable Procedures:  None  Impression & Plans:  Aamya Kientz is a 78 y.o. 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 MRI does not show any other masses but cannot assess neck Ddx includes benign etiologies (Warthin's, Pleomorphic) v/s malignant (skin met, other malignant parotid tumors). It appears to be benign.  Cleared by PCP to hold ASA 7 days prior surgery, likely restart POD 3. She did see Dr. Ashley Royalty and Ivan Croft and was noted to be doing well.  We discussed R/B/A of procedure, including pain/bleeding/infection, facial weakness or paralysis, numbness, first bite syndrome, gustatory sweating, among others. We discussed need for drain placement. Will stay overnight.   She is ready to proceed  Thank you for allowing me the opportunity to care for your patient. Please do not hesitate to contact me should you have any other questions.  Sincerely, Jovita Kussmaul, MD Otolarynoglogist (ENT), [facility] Phone: [phone] Fax: [phone]  11/29/2023, 1:10 PM   MDM:  Level 4 - 99214 Complexity/Problems addressed: mod - multiple hronic problems Data complexity: mod - independent review of CT imaging - Morbidity: mod - Of note, we also addressed her submandibular gland findings which were not  noted prior to this.

## 2023-12-06 NOTE — Pre-Procedure Instructions (Addendum)
Surgical Instructions   Your procedure is scheduled on December 12, 2023. Report to [facility] Main Entrance "A" at 11:15 A.M., then check in with the Admitting office. Any questions or running late day of surgery: call [phone]  Questions prior to your surgery date: call [phone], Monday-Friday, 8am-4pm. If you experience any cold or flu symptoms such as cough, fever, chills, shortness of breath, etc. between now and your scheduled surgery, please notify us at the above number.     Remember:  Do not eat or drink after midnight the night before your surgery   Take these medicines the morning of surgery with A SIP OF WATER: amLODipine (NORVASC)    Follow your surgeon's instructions on when to stop Aspirin.  If no instructions were given by your surgeon then you will need to call the office to get those instructions.     One week prior to surgery, STOP taking any Aleve, Naproxen, Ibuprofen, Motrin, Advil, Goody's, BC's, all herbal medications, fish oil, and non-prescription vitamins.                     Do NOT Smoke (Tobacco/Vaping) for 24 hours prior to your procedure.  If you use a CPAP at night, you may bring your mask/headgear for your overnight stay.   You will be asked to remove any contacts, glasses, piercing's, hearing aid's, dentures/partials prior to surgery. Please bring cases for these items if needed.    Patients discharged the day of surgery will not be allowed to drive home, and someone needs to stay with them for 24 hours.  SURGICAL WAITING ROOM VISITATION Patients may have no more than 2 support people in the waiting area - these visitors may rotate.   Pre-op nurse will coordinate an appropriate time for 1 ADULT support person, who may not rotate, to accompany patient in pre-op.  Children under the age of 18 must have an adult with them who is not the patient and must remain in the main waiting area with an adult.  If the patient needs to stay at the  hospital during part of their recovery, the visitor guidelines for inpatient rooms apply.  Please refer to the [facility] website for the visitor guidelines for any additional information.   If you received a COVID test during your pre-op visit  it is requested that you wear a mask when out in public, stay away from anyone that may not be feeling well and notify your surgeon if you develop symptoms. If you have been in contact with anyone that has tested positive in the last 10 days please notify you surgeon.      Pre-operative CHG Bathing Instructions   You can play a key role in reducing the risk of infection after surgery. Your skin needs to be as free of germs as possible. You can reduce the number of germs on your skin by washing with CHG (chlorhexidine gluconate) soap before surgery. CHG is an antiseptic soap that kills germs and continues to kill germs even after washing.   DO NOT use if you have an allergy to chlorhexidine/CHG or antibacterial soaps. If your skin becomes reddened or irritated, stop using the CHG and notify one of our RNs at [phone].              TAKE A SHOWER THE NIGHT BEFORE SURGERY AND THE DAY OF SURGERY    Please keep in mind the following:  DO NOT shave, including legs and underarms, 48  hours prior to surgery.   You may shave your face before/day of surgery.  Place clean sheets on your bed the night before surgery Use a clean washcloth (not used since being washed) for each shower. DO NOT sleep with pet's night before surgery.  CHG Shower Instructions:  Wash your face and private area with normal soap. If you choose to wash your hair, wash first with your normal shampoo.  After you use shampoo/soap, rinse your hair and body thoroughly to remove shampoo/soap residue.  Turn the water OFF and apply half the bottle of CHG soap to a CLEAN washcloth.  Apply CHG soap ONLY FROM YOUR NECK DOWN TO YOUR TOES (washing for 3-5 minutes)  DO NOT use CHG soap on face,  private areas, open wounds, or sores.  Pay special attention to the area where your surgery is being performed.  If you are having back surgery, having someone wash your back for you may be helpful. Wait 2 minutes after CHG soap is applied, then you may rinse off the CHG soap.  Pat dry with a clean towel  Put on clean pajamas    Additional instructions for the day of surgery: DO NOT APPLY any lotions, deodorants, cologne, or perfumes.   Do not wear jewelry or makeup Do not wear nail polish, gel polish, artificial nails, or any other type of covering on natural nails (fingers and toes) Do not bring valuables to the hospital. [facility] is not responsible for valuables/personal belongings. Put on clean/comfortable clothes.  Please brush your teeth.  Ask your nurse before applying any prescription medications to the skin.

## 2023-12-07 ENCOUNTER — Encounter (HOSPITAL_COMMUNITY)
Admission: RE | Admit: 2023-12-07 | Discharge: 2023-12-07 | Disposition: A | Discharge status: Home / Self Care | Payer: Medicare HMO | Source: Ambulatory Visit | Attending: Otolaryngology | Admitting: Otolaryngology

## 2023-12-07 ENCOUNTER — Other Ambulatory Visit: Payer: Self-pay

## 2023-12-07 ENCOUNTER — Encounter (HOSPITAL_COMMUNITY): Payer: Self-pay

## 2023-12-07 VITALS — BP 146/77 | HR 85 | Temp 98.0°F | Resp 17 | Ht 67.0 in | Wt 183.1 lb

## 2023-12-07 DIAGNOSIS — Z01812 Encounter for preprocedural laboratory examination: Secondary | ICD-10-CM | POA: Insufficient documentation

## 2023-12-07 DIAGNOSIS — I509 Heart failure, unspecified: Secondary | ICD-10-CM | POA: Insufficient documentation

## 2023-12-07 DIAGNOSIS — Z86711 Personal history of pulmonary embolism: Secondary | ICD-10-CM | POA: Insufficient documentation

## 2023-12-07 DIAGNOSIS — I110 Hypertensive heart disease with heart failure: Secondary | ICD-10-CM | POA: Diagnosis not present

## 2023-12-07 DIAGNOSIS — Z86718 Personal history of other venous thrombosis and embolism: Secondary | ICD-10-CM | POA: Insufficient documentation

## 2023-12-07 DIAGNOSIS — E785 Hyperlipidemia, unspecified: Secondary | ICD-10-CM | POA: Diagnosis not present

## 2023-12-07 DIAGNOSIS — I2510 Atherosclerotic heart disease of native coronary artery without angina pectoris: Secondary | ICD-10-CM | POA: Diagnosis not present

## 2023-12-07 DIAGNOSIS — Z951 Presence of aortocoronary bypass graft: Secondary | ICD-10-CM | POA: Diagnosis not present

## 2023-12-07 DIAGNOSIS — Z7901 Long term (current) use of anticoagulants: Secondary | ICD-10-CM | POA: Diagnosis not present

## 2023-12-07 DIAGNOSIS — D110 Benign neoplasm of parotid gland: Secondary | ICD-10-CM | POA: Insufficient documentation

## 2023-12-07 DIAGNOSIS — E039 Hypothyroidism, unspecified: Secondary | ICD-10-CM | POA: Diagnosis not present

## 2023-12-07 LAB — CBC
HCT: 42.1 % (ref 36.0–46.0)
Hemoglobin: 13.5 g/dL (ref 12.0–15.0)
MCH: 27.3 pg (ref 26.0–34.0)
MCHC: 32.1 g/dL (ref 30.0–36.0)
MCV: 85.2 fL (ref 80.0–100.0)
Platelets: 281 10*3/uL (ref 150–400)
RBC: 4.94 MIL/uL (ref 3.87–5.11)
RDW: 14.7 % (ref 11.5–15.5)
WBC: 6.1 10*3/uL (ref 4.0–10.5)
nRBC: 0 % (ref 0.0–0.2)

## 2023-12-07 LAB — BASIC METABOLIC PANEL
Anion gap: 10 (ref 5–15)
BUN: 14 mg/dL (ref 8–23)
CO2: 29 mmol/L (ref 22–32)
Calcium: 9.8 mg/dL (ref 8.9–10.3)
Chloride: 102 mmol/L (ref 98–111)
Creatinine, Ser: 1.33 mg/dL — ABNORMAL HIGH (ref 0.44–1.00)
GFR, Estimated: 41 mL/min — ABNORMAL LOW (ref >60)
Glucose, Bld: 116 mg/dL — ABNORMAL HIGH (ref 70–99)
Potassium: 3.2 mmol/L — ABNORMAL LOW (ref 3.5–5.1)
Sodium: 141 mmol/L (ref 135–145)

## 2023-12-07 NOTE — Progress Notes (Signed)
PCP - [name], DO Cardiologist - Dr. Minna Merritts - Last office visit 08/24/2023  PPM/ICD - Denies Device Orders - n/a Rep Notified - n/a  Chest x-ray - 02/27/2023 EKG - 10/30/2023 Stress Test - 07/03/2019 ECHO - 08/24/2023 Cardiac Cath - 08/27/2013  Sleep Study - Denies CPAP - n/a  No DM  Last dose of GLP1 agonist- n/a GLP1 instructions: n/a  Blood Thinner Instructions: n/a Aspirin Instructions: Pt has already stopped ASA. Last dose was January 21st  NPO after midnight  COVID TEST- n/a   Anesthesia review: Yes. Cardiac hx including AVR/CABG in 2014. Discussed with Shonna Chock, PA-C  Patient denies shortness of breath, fever, cough and chest pain at PAT appointment. Pt denies any respiratory illness/infection in the last two months.    All instructions explained to the patient, with a verbal understanding of the material. Patient agrees to go over the instructions while at home for a better understanding. Patient also instructed to self quarantine after being tested for COVID-19. The opportunity to ask questions was provided.

## 2023-12-10 ENCOUNTER — Encounter (HOSPITAL_COMMUNITY): Payer: Self-pay

## 2023-12-10 NOTE — Progress Notes (Signed)
Anesthesia Chart Review:  Case: [id number] Date/Time: 12/12/23 1400   Procedures:      PAROTIDECTOMY     CONTINUOUS NERVE MONITORING     RADICAL NECK DISSECTION (Left)   Anesthesia type: Choice   Pre-op diagnosis: PAROTID MASS   [facility] ROOM 07 / [facility]   Surgeons: Read Drivers, MD       DISCUSSION: Patient is a 78 year old female scheduled for the above procedure.  History includes never smoker, HTN, HLD, hypothyroidism, CAD with bicuspid AV/severe AS and ascending TAA (s/p CABG: LIMA-LAD, SVG-ramus, SVG-RCA & replacement of ascending TAA with 28 mm Hemashield graft, and Magna Ease 21 mm pericardial tissue AV replacement on 10/02/13 by Dr. Raoul Pitch with [facility]), CHF, osteoarthritis (right TKA 02/19/23), provoked RLE DVT/PE (02/27/23, s/p Eliquis x 3 months).  She had evaluation by Dr. Ashley Royalty on 10/30/2023 for medical clearance for parotidectomy. He wrote, "Pre-operative exam RCRI of 1.  Medically cleared to proceed with surgery. May hold ASA 1 week prior and resume the following day post-operatively.  EKG without changes and denies new symptoms.  Updated labs ordered."  Last cardiology visit with Dr. Ivan Croft was on 08/24/23 for routine follow-up CAD, AS s/p AVR. Echo was updated and showed no significant changes with normal arctic valve prosthesis function, no AR, mild MR, trivial TR, LVEF 55 to 60%.  She was doing well on her current medical regimen.  HLD managed with Repatha. She was doing well from a cardiac standpoint, so 1 year follow-up recommended.  Preoperative labs noted. Creatinine 1.33, previously ~ 0.8-0.9 range. Consider repeat while in hospital versus follow out-patient. She is on hydrochlorothiazide. She reported last ASA 12/04/23.   Anesthesia team to evaluate on the day of surgery.   VS: BP (!) 146/77   Pulse 85   Temp 36.7 C   Resp 17   Ht 5\' 7"  (1.702 m)   Wt 83.1 kg   SpO2 97%   BMI 28.68 kg/m   PROVIDERS: [name], DO is PCP  Minna Merritts,  MD is cardiologist   LABS: Preoperative labs noted.  (all labs ordered are listed, but only abnormal results are displayed)  Labs Reviewed  BASIC METABOLIC PANEL - Abnormal; Notable for the following components:      Result Value   Potassium 3.2 (*)    Glucose, Bld 116 (*)    Creatinine, Ser 1.33 (*)    GFR, Estimated 41 (*)    All other components within normal limits  CBC     IMAGES: CT Soft tissue neck 10/01/23: IMPRESSION: 1. 2.5 cm lobular enhancing mass within the lateral aspect of the superficial lobe of the left parotid gland. Maximal axial diameter in the upper portion measures 18 x 16 mm. The edges are largely well-circumscribed. Likely pleomorphic adenoma, but malignant lesion not absolutely excluded. No lymphadenopathy. 2. Multiple stones collected within a dilated ductal diverticulum of the right submandibular gland. The grouping of stones in total measures up to 15 mm, but there are probably 20 or more small stones collected together. No stone seen more distal within the submandibular duct. 3. Aortic atherosclerosis.   MRI Brain 07/23/23: IMPRESSION: 1. No acute intracranial abnormality. 2. Mild chronic small vessel ischemic disease. 3. 2.5 cm left parotid mass. Recommend ENT referral.   [place] RLE Venous 02/27/23: IMPRESSION: 1. Almost occlusive thrombus of the right greater saphenous vein at the saphenofemoral junction down to the mid thigh. 2. Occlusive thrombus of the right peroneal and soleal vein within  the calf. 3. No evidence of deep venous thrombosis of the left lower extremity.  CTA Chest 02/27/23: IMPRESSION: 1. Extensive bilateral lobar and segmental pulmonary emboli with CT evidence of right heart strain (RV/LV Ratio = 1.4 ) consistent with at least submassive (intermediate risk) PE. The presence of right heart strain has been associated with an increased risk of morbidity and mortality. Please refer to the "Code PE Focused" order set  in EPIC. 2. Trace bilateral pleural effusions.   EKG:  EKG 10/30/23 (done as part of surgical clearance evaluation by [name], DO): Normal sinus rhythm.  T wave abnormality, consider lateral ischemia, abnormal ECG. Dr. Ashley Royalty reviewed and felt tracing was similar to prior.   EKG 08/01/22 ([facility]): Per narrative in [facility]: Sinus  Rhythm  Mild nonspecific ST segment T wave abnormality    CV: Echo 08/24/23 ([facility]): Left Ventricle: Systolic function is normal. EF: 55-60%. Ejection  fraction measured by 3D is 54%, which is normal.    Left Ventricle: No regional wall motion abnormalities noted.    Aortic Valve: There is a pericardial bioprosthetic valve.    Aortic Valve: The prosthetic valve appears to be well-seated without  rocking. The prosthetic valve function is normal, with peak and mean  gradients of 22.000 and 12.000 mmHg.    Aortic Valve: There is no regurgitation.    Mitral Valve: There is mild regurgitation.    Tricuspid Valve: There is trace regurgitation.    Tricuspid Valve: The right ventricular systolic pressure is normal (<36  mmHg).   Stress echo on 07/03/19. Results not viewable in [facility], but was done prior to colpocleisis, Altis sling, cystoscopy.   Past Medical History:  Diagnosis Date   Aortic stenosis due to bicuspid aortic valve 10/02/2013   s/p replacement of ascending TAA with Hemashield graft & AVR using 21 mm Magna Ease bioprosthesis ([facility])   Arthritis    CHF (congestive heart failure) (HCC) 2014   Coronary artery disease    s/pc combined 3V CABG/AVR 10/02/13   DVT (deep venous thrombosis) (HCC) 02/27/2023   post-TKA RLE DVT & PE 02/27/23, s/p 3 months Eliquis   Heart murmur    Hx of CABG    Hyperlipidemia 2014   Hypertension    Hypothyroidism    hx of 25 years ago per pt   PE (pulmonary thromboembolism) (HCC) 02/27/2023   provoked   Rectocele    Uterine prolapse     Past Surgical History:  Procedure Laterality Date    CARDIAC CATHETERIZATION  08/27/2013   CARDIAC VALVE REPLACEMENT  09/2013   AVR   CORONARY ARTERY BYPASS GRAFT  09/2013   prolapsed uterus surgery      TONSILLECTOMY     Removed as a child   TOTAL KNEE ARTHROPLASTY Right 02/19/2023   Procedure: TOTAL KNEE ARTHROPLASTY;  Surgeon: Jodi Geralds, MD;  Location: [facility];  Service: Orthopedics;  Laterality: Right;   TUBAL LIGATION  1978    MEDICATIONS:  amLODipine (NORVASC) 5 MG tablet   aspirin 325 MG tablet   Evolocumab (REPATHA SURECLICK) 140 MG/ML SOAJ   hydrochlorothiazide (HYDRODIURIL) 12.5 MG tablet   No current facility-administered medications for this encounter.    Shonna Chock, PA-C Surgical Short Stay/Anesthesiology [facility] Phone [phone] [facility] Phone [phone] 12/10/2023 2:50 PM

## 2023-12-10 NOTE — Anesthesia Preprocedure Evaluation (Signed)
Anesthesia Evaluation  Patient identified by MRN, date of birth, ID band Patient awake    Reviewed: Allergy & Precautions, NPO status , Patient's Chart, lab work & pertinent test results, reviewed documented beta blocker date and time   History of Anesthesia Complications Negative for: history of anesthetic complications  Airway Mallampati: II  TM Distance: >3 FB     Dental no notable dental hx.    Pulmonary neg COPD, PE (remote HX)   breath sounds clear to auscultation       Cardiovascular hypertension, (-) angina + CAD, + CABG and +CHF  (-) Past MI and (-) Cardiac Stents + Valvular Problems/Murmurs (s/p AVR)  Rhythm:Regular Rate:Normal     Neuro/Psych neg Seizures    GI/Hepatic ,neg GERD  ,,(+) neg Cirrhosis        Endo/Other  Hypothyroidism    Renal/GU Renal disease     Musculoskeletal  (+) Arthritis ,    Abdominal   Peds  Hematology   Anesthesia Other Findings   Reproductive/Obstetrics                              Anesthesia Physical Anesthesia Plan  ASA: 2  Anesthesia Plan: General   Post-op Pain Management:    Induction: Intravenous  PONV Risk Score and Plan: 2 and Ondansetron and Dexamethasone  Airway Management Planned: Oral ETT and Video Laryngoscope Planned  Additional Equipment:   Intra-op Plan:   Post-operative Plan: Extubation in OR  Informed Consent: I have reviewed the patients History and Physical, chart, labs and discussed the procedure including the risks, benefits and alternatives for the proposed anesthesia with the patient or authorized representative who has indicated his/her understanding and acceptance.     Dental advisory given  Plan Discussed with: CRNA  Anesthesia Plan Comments: (PAT note written 12/10/2023 by Shonna Chock, PA-C.  )        Anesthesia Quick Evaluation

## 2023-12-11 NOTE — Progress Notes (Signed)
Spoke with the pt, she will arrive tom at 1215. NPO post midnight.

## 2023-12-12 ENCOUNTER — Encounter (HOSPITAL_COMMUNITY): Payer: Self-pay

## 2023-12-12 ENCOUNTER — Inpatient Hospital Stay (HOSPITAL_COMMUNITY): Payer: Medicare HMO | Admitting: Certified Registered Nurse Anesthetist

## 2023-12-12 ENCOUNTER — Inpatient Hospital Stay (HOSPITAL_COMMUNITY): Payer: Self-pay | Admitting: Vascular Surgery

## 2023-12-12 ENCOUNTER — Other Ambulatory Visit: Payer: Self-pay

## 2023-12-12 ENCOUNTER — Encounter (HOSPITAL_COMMUNITY)
Admission: RE | Disposition: A | Discharge status: Home / Self Care | Payer: Self-pay | Source: Home / Self Care | Attending: Otolaryngology

## 2023-12-12 ENCOUNTER — Inpatient Hospital Stay (HOSPITAL_COMMUNITY)
Admission: RE | Admit: 2023-12-12 | Discharge: 2023-12-13 | DRG: 139 | Disposition: A | Discharge status: Home / Self Care | Payer: Medicare HMO | Attending: Otolaryngology | Admitting: Otolaryngology

## 2023-12-12 DIAGNOSIS — Z9851 Tubal ligation status: Secondary | ICD-10-CM

## 2023-12-12 DIAGNOSIS — E039 Hypothyroidism, unspecified: Secondary | ICD-10-CM | POA: Diagnosis not present

## 2023-12-12 DIAGNOSIS — K118 Other diseases of salivary glands: Secondary | ICD-10-CM | POA: Diagnosis not present

## 2023-12-12 DIAGNOSIS — Z86718 Personal history of other venous thrombosis and embolism: Secondary | ICD-10-CM | POA: Diagnosis not present

## 2023-12-12 DIAGNOSIS — I110 Hypertensive heart disease with heart failure: Secondary | ICD-10-CM | POA: Diagnosis not present

## 2023-12-12 DIAGNOSIS — I2510 Atherosclerotic heart disease of native coronary artery without angina pectoris: Secondary | ICD-10-CM | POA: Diagnosis not present

## 2023-12-12 DIAGNOSIS — C07 Malignant neoplasm of parotid gland: Secondary | ICD-10-CM | POA: Diagnosis not present

## 2023-12-12 DIAGNOSIS — Z951 Presence of aortocoronary bypass graft: Secondary | ICD-10-CM | POA: Diagnosis not present

## 2023-12-12 DIAGNOSIS — Z86711 Personal history of pulmonary embolism: Secondary | ICD-10-CM

## 2023-12-12 DIAGNOSIS — Z96651 Presence of right artificial knee joint: Secondary | ICD-10-CM | POA: Diagnosis present

## 2023-12-12 DIAGNOSIS — Z952 Presence of prosthetic heart valve: Secondary | ICD-10-CM | POA: Diagnosis not present

## 2023-12-12 DIAGNOSIS — E785 Hyperlipidemia, unspecified: Secondary | ICD-10-CM | POA: Diagnosis not present

## 2023-12-12 HISTORY — PX: CONTINUOUS NERVE MONITORING: SHX6650

## 2023-12-12 HISTORY — PX: PAROTIDECTOMY: SHX2163

## 2023-12-12 SURGERY — EXCISION, PAROTID GLAND
Anesthesia: General | Site: Face

## 2023-12-12 MED ORDER — EPINEPHRINE HCL (NASAL) 0.1 % NA SOLN
NASAL | Status: AC
  Filled 2023-12-12: qty 30

## 2023-12-12 MED ORDER — DEXAMETHASONE SODIUM PHOSPHATE 10 MG/ML IJ SOLN: 10.0000 mg | Freq: Once | INTRAMUSCULAR | Status: DC

## 2023-12-12 MED ORDER — PROPOFOL 500 MG/50ML IV EMUL
INTRAVENOUS | Status: DC | PRN
  Administered 2023-12-12: 75 ug/kg/min via INTRAVENOUS

## 2023-12-12 MED ORDER — OXYCODONE HCL 5 MG/5ML PO SOLN: 5.0000 mg | Freq: Once | ORAL | Status: DC | PRN

## 2023-12-12 MED ORDER — DOCUSATE SODIUM 100 MG PO CAPS
100.0000 mg | ORAL_CAPSULE | Freq: Two times a day (BID) | ORAL | Status: DC
  Administered 2023-12-12 – 2023-12-13 (×2): 100 mg via ORAL
  Filled 2023-12-12 (×2): qty 1

## 2023-12-12 MED ORDER — ACETAMINOPHEN 160 MG/5ML PO SOLN
650.0000 mg | ORAL | Status: DC
  Administered 2023-12-13 (×3): 650 mg via ORAL
  Filled 2023-12-12 (×4): qty 20.3

## 2023-12-12 MED ORDER — LACTATED RINGERS IV SOLN: INTRAVENOUS | Status: DC

## 2023-12-12 MED ORDER — DEXMEDETOMIDINE HCL IN NACL 80 MCG/20ML IV SOLN
INTRAVENOUS | Status: DC | PRN
  Administered 2023-12-12: 6 ug via INTRAVENOUS

## 2023-12-12 MED ORDER — PHENYLEPHRINE 80 MCG/ML (10ML) SYRINGE FOR IV PUSH (FOR BLOOD PRESSURE SUPPORT)
PREFILLED_SYRINGE | INTRAVENOUS | Status: DC | PRN
  Administered 2023-12-12: 80 ug via INTRAVENOUS

## 2023-12-12 MED ORDER — SODIUM CHLORIDE 0.9 % IV SOLN: INTRAVENOUS | Status: DC

## 2023-12-12 MED ORDER — MELATONIN 5 MG PO TABS: 5.0000 mg | ORAL_TABLET | Freq: Every evening | ORAL | Status: DC | PRN

## 2023-12-12 MED ORDER — EPINEPHRINE PF 1 MG/ML IJ SOLN
INTRAMUSCULAR | Status: AC
  Filled 2023-12-12: qty 1

## 2023-12-12 MED ORDER — BACITRACIN ZINC 500 UNIT/GM EX OINT
1.0000 | TOPICAL_OINTMENT | Freq: Three times a day (TID) | CUTANEOUS | Status: DC
  Administered 2023-12-12 – 2023-12-13 (×2): 1 via TOPICAL
  Filled 2023-12-12: qty 28.35

## 2023-12-12 MED ORDER — DEXAMETHASONE SODIUM PHOSPHATE 10 MG/ML IJ SOLN
INTRAMUSCULAR | Status: DC | PRN
  Administered 2023-12-12: 10 mg via INTRAVENOUS

## 2023-12-12 MED ORDER — FENTANYL CITRATE (PF) 250 MCG/5ML IJ SOLN
INTRAMUSCULAR | Status: DC | PRN
  Administered 2023-12-12 (×6): 50 ug via INTRAVENOUS

## 2023-12-12 MED ORDER — PROPOFOL 10 MG/ML IV BOLUS
INTRAVENOUS | Status: AC
  Filled 2023-12-12: qty 20

## 2023-12-12 MED ORDER — ACETAMINOPHEN 10 MG/ML IV SOLN: 1000.0000 mg | Freq: Once | INTRAVENOUS | Status: DC | PRN

## 2023-12-12 MED ORDER — CHLORHEXIDINE GLUCONATE 0.12 % MT SOLN
15.0000 mL | Freq: Once | OROMUCOSAL | Status: AC
  Administered 2023-12-12: 15 mL via OROMUCOSAL
  Filled 2023-12-12: qty 15

## 2023-12-12 MED ORDER — SUCCINYLCHOLINE CHLORIDE 200 MG/10ML IV SOSY
PREFILLED_SYRINGE | INTRAVENOUS | Status: AC
  Filled 2023-12-12: qty 10

## 2023-12-12 MED ORDER — ONDANSETRON HCL 4 MG/2ML IJ SOLN: 4.0000 mg | Freq: Once | INTRAMUSCULAR | Status: DC | PRN

## 2023-12-12 MED ORDER — ENOXAPARIN SODIUM 40 MG/0.4ML IJ SOSY
40.0000 mg | PREFILLED_SYRINGE | INTRAMUSCULAR | Status: DC
Start: 2023-12-13 — End: 2023-12-13
  Administered 2023-12-13: 40 mg via SUBCUTANEOUS
  Filled 2023-12-12: qty 0.4

## 2023-12-12 MED ORDER — ONDANSETRON HCL 4 MG/2ML IJ SOLN
INTRAMUSCULAR | Status: AC
  Filled 2023-12-12: qty 2

## 2023-12-12 MED ORDER — LIDOCAINE 2% (20 MG/ML) 5 ML SYRINGE
INTRAMUSCULAR | Status: DC | PRN
  Administered 2023-12-12: 100 mg via INTRAVENOUS

## 2023-12-12 MED ORDER — BACITRACIN ZINC 500 UNIT/GM EX OINT
TOPICAL_OINTMENT | CUTANEOUS | Status: DC | PRN
  Administered 2023-12-12: 1 via TOPICAL

## 2023-12-12 MED ORDER — HEMOSTATIC AGENTS (NO CHARGE) OPTIME
TOPICAL | Status: DC | PRN
  Administered 2023-12-12: 1 via TOPICAL

## 2023-12-12 MED ORDER — LIDOCAINE-EPINEPHRINE 1 %-1:100000 IJ SOLN
INTRAMUSCULAR | Status: DC | PRN
  Administered 2023-12-12: 7 mL

## 2023-12-12 MED ORDER — PHENYLEPHRINE HCL-NACL 20-0.9 MG/250ML-% IV SOLN
INTRAVENOUS | Status: DC | PRN
  Administered 2023-12-12: 40 ug/min via INTRAVENOUS

## 2023-12-12 MED ORDER — ORAL CARE MOUTH RINSE: 15.0000 mL | Freq: Once | OROMUCOSAL | Status: AC

## 2023-12-12 MED ORDER — ACETAMINOPHEN 650 MG RE SUPP: 650.0000 mg | RECTAL | Status: DC

## 2023-12-12 MED ORDER — LIDOCAINE-EPINEPHRINE 1 %-1:100000 IJ SOLN
INTRAMUSCULAR | Status: AC
  Filled 2023-12-12: qty 1

## 2023-12-12 MED ORDER — BACITRACIN ZINC 500 UNIT/GM EX OINT
TOPICAL_OINTMENT | CUTANEOUS | Status: AC
  Filled 2023-12-12: qty 28.35

## 2023-12-12 MED ORDER — ACETAMINOPHEN 10 MG/ML IV SOLN
INTRAVENOUS | Status: DC | PRN
  Administered 2023-12-12: 1000 mg via INTRAVENOUS

## 2023-12-12 MED ORDER — OXYCODONE HCL 5 MG PO TABS: 5.0000 mg | ORAL_TABLET | Freq: Once | ORAL | Status: DC | PRN

## 2023-12-12 MED ORDER — HYDROCHLOROTHIAZIDE 12.5 MG PO TABS
12.5000 mg | ORAL_TABLET | Freq: Every day | ORAL | Status: DC
  Administered 2023-12-12 – 2023-12-13 (×2): 12.5 mg via ORAL
  Filled 2023-12-12 (×2): qty 1

## 2023-12-12 MED ORDER — DEXAMETHASONE SODIUM PHOSPHATE 10 MG/ML IJ SOLN
INTRAMUSCULAR | Status: AC
  Filled 2023-12-12: qty 1

## 2023-12-12 MED ORDER — SUCCINYLCHOLINE CHLORIDE 200 MG/10ML IV SOSY
PREFILLED_SYRINGE | INTRAVENOUS | Status: DC | PRN
  Administered 2023-12-12: 160 mg via INTRAVENOUS

## 2023-12-12 MED ORDER — AMLODIPINE BESYLATE 5 MG PO TABS
5.0000 mg | ORAL_TABLET | Freq: Every day | ORAL | Status: DC
  Administered 2023-12-12 – 2023-12-13 (×2): 5 mg via ORAL
  Filled 2023-12-12 (×2): qty 1

## 2023-12-12 MED ORDER — 0.9 % SODIUM CHLORIDE (POUR BTL) OPTIME
TOPICAL | Status: DC | PRN
  Administered 2023-12-12: 1000 mL

## 2023-12-12 MED ORDER — ONDANSETRON HCL 4 MG/2ML IJ SOLN
INTRAMUSCULAR | Status: DC | PRN
  Administered 2023-12-12: 4 mg via INTRAVENOUS

## 2023-12-12 MED ORDER — OXYCODONE HCL 5 MG PO TABS: 5.0000 mg | ORAL_TABLET | Freq: Four times a day (QID) | ORAL | Status: DC | PRN

## 2023-12-12 MED ORDER — LIDOCAINE 2% (20 MG/ML) 5 ML SYRINGE
INTRAMUSCULAR | Status: AC
  Filled 2023-12-12: qty 5

## 2023-12-12 MED ORDER — FENTANYL CITRATE (PF) 250 MCG/5ML IJ SOLN
INTRAMUSCULAR | Status: AC
  Filled 2023-12-12: qty 5

## 2023-12-12 MED ORDER — PROPOFOL 10 MG/ML IV BOLUS
INTRAVENOUS | Status: DC | PRN
  Administered 2023-12-12: 120 mg via INTRAVENOUS

## 2023-12-12 MED ORDER — SENNA 8.6 MG PO TABS
1.0000 | ORAL_TABLET | Freq: Two times a day (BID) | ORAL | Status: DC
  Administered 2023-12-12 – 2023-12-13 (×2): 8.6 mg via ORAL
  Filled 2023-12-12 (×2): qty 1

## 2023-12-12 MED ORDER — EPHEDRINE SULFATE-NACL 50-0.9 MG/10ML-% IV SOSY
PREFILLED_SYRINGE | INTRAVENOUS | Status: DC | PRN
  Administered 2023-12-12: 5 mg via INTRAVENOUS
  Administered 2023-12-12: 10 mg via INTRAVENOUS
  Administered 2023-12-12: 5 mg via INTRAVENOUS

## 2023-12-12 MED ORDER — PHENYLEPHRINE 80 MCG/ML (10ML) SYRINGE FOR IV PUSH (FOR BLOOD PRESSURE SUPPORT)
PREFILLED_SYRINGE | INTRAVENOUS | Status: AC
  Filled 2023-12-12: qty 10

## 2023-12-12 MED ORDER — FENTANYL CITRATE (PF) 100 MCG/2ML IJ SOLN: 25.0000 ug | INTRAMUSCULAR | Status: DC | PRN

## 2023-12-12 MED ORDER — CEFAZOLIN SODIUM-DEXTROSE 2-4 GM/100ML-% IV SOLN
2.0000 g | Freq: Once | INTRAVENOUS | Status: AC
  Administered 2023-12-12: 2 g via INTRAVENOUS
  Filled 2023-12-12: qty 100

## 2023-12-12 SURGICAL SUPPLY — 79 items
ATTRACTOMAT 16X20 MAGNETIC DRP (DRAPES) IMPLANT
BAG COUNTER SPONGE SURGICOUNT (BAG) ×1 IMPLANT
BLADE SURG 12 STRL SS (BLADE) IMPLANT
BLADE SURG 15 STRL LF DISP TIS (BLADE) ×2 IMPLANT
CANISTER SUCT 3000ML PPV (MISCELLANEOUS) ×2 IMPLANT
CLEANER TIP ELECTROSURG 2X2 (MISCELLANEOUS) ×2 IMPLANT
CLIP TI MEDIUM 24 (CLIP) ×2 IMPLANT
CLIP TI WIDE RED SMALL 24 (CLIP) ×2 IMPLANT
CORD BIPOLAR FORCEPS 12FT (ELECTRODE) ×2 IMPLANT
COVER SURGICAL LIGHT HANDLE (MISCELLANEOUS) ×2 IMPLANT
DRAIN 1/8 RD END PERF LFSIL ST (DRAIN) ×1 IMPLANT
DRAIN CHANNEL 15F RND FF W/TCR (WOUND CARE) IMPLANT
DRAIN CHANNEL 19F RND (DRAIN) IMPLANT
DRAIN JP 10F RND RADIO (DRAIN) IMPLANT
DRAIN JP 15F RND RADIO PRF (DRAIN) IMPLANT
DRAIN PENROSE 0.25X18 (DRAIN) IMPLANT
DRAPE INCISE 13X13 STRL (DRAPES) IMPLANT
DRAPE INCISE 23X17 STRL (DRAPES) ×1 IMPLANT
DRAPE INCISE IOBAN 23X17 STRL (DRAPES)
DRAPE INCISE IOBAN 66X45 STRL (DRAPES) ×1 IMPLANT
DRAPE UTILITY XL STRL (DRAPES) IMPLANT
DRSG TEGADERM 2-3/8X2-3/4 SM (GAUZE/BANDAGES/DRESSINGS) ×5 IMPLANT
DRSG TEGADERM 4X4.75 (GAUZE/BANDAGES/DRESSINGS) ×3 IMPLANT
ELECT COATED BLADE 2.86 ST (ELECTRODE) ×2 IMPLANT
ELECT PAIRED SUBDERMAL (MISCELLANEOUS) ×4
ELECT REM PT RETURN 9FT ADLT (ELECTROSURGICAL) ×2
ELECTRODE PAIRED SUBDERMAL (MISCELLANEOUS) ×1 IMPLANT
ELECTRODE REM PT RTRN 9FT ADLT (ELECTROSURGICAL) ×1 IMPLANT
EVACUATOR SILICONE 100CC (DRAIN) ×1 IMPLANT
FORCEPS BIPOLAR SPETZLER 8 1.0 (NEUROSURGERY SUPPLIES) IMPLANT
GAUZE 4X4 16PLY ~~LOC~~+RFID DBL (SPONGE) ×1 IMPLANT
GAUZE SPONGE 2X2 STRL 8-PLY (GAUZE/BANDAGES/DRESSINGS) IMPLANT
GLOVE BIO SURGEON STRL SZ 6.5 (GLOVE) ×2 IMPLANT
GLOVE BIO SURGEON STRL SZ7.5 (GLOVE) ×1 IMPLANT
GOWN STRL REUS W/ TWL LRG LVL3 (GOWN DISPOSABLE) ×2 IMPLANT
GOWN STRL REUS W/ TWL XL LVL3 (GOWN DISPOSABLE) ×2 IMPLANT
HEMOSTAT SNOW SURGICEL 2X4 (HEMOSTASIS) ×1 IMPLANT
KIT BASIN OR (CUSTOM PROCEDURE TRAY) ×2 IMPLANT
KIT TURNOVER KIT B (KITS) ×2 IMPLANT
LOCATOR NERVE 3 VOLT (DISPOSABLE) ×1 IMPLANT
NDL FILTER BLUNT 18X1 1/2 (NEEDLE) ×1 IMPLANT
NDL HYPO 25GX1X1/2 BEV (NEEDLE) ×1 IMPLANT
NDL PRECISIONGLIDE 27X1.5 (NEEDLE) ×1 IMPLANT
NEEDLE FILTER BLUNT 18X1 1/2 (NEEDLE)
NEEDLE HYPO 25GX1X1/2 BEV (NEEDLE)
NEEDLE PRECISIONGLIDE 27X1.5 (NEEDLE) ×2
NS IRRIG 1000ML POUR BTL (IV SOLUTION) ×2 IMPLANT
PAD ARMBOARD 7.5X6 YLW CONV (MISCELLANEOUS) ×3 IMPLANT
PATTIES SURGICAL .5 X3 (DISPOSABLE) IMPLANT
PENCIL SMOKE EVACUATOR (MISCELLANEOUS) ×2 IMPLANT
POSITIONER HEAD DONUT 9IN (MISCELLANEOUS) ×2 IMPLANT
PROBE NERVBE PRASS .33 (MISCELLANEOUS) ×3 IMPLANT
SET WALTER ACTIVATION W/DRAPE (SET/KITS/TRAYS/PACK) IMPLANT
SPONGE INTESTINAL PEANUT (DISPOSABLE) ×2 IMPLANT
SPONGE T-LAP 18X18 ~~LOC~~+RFID (SPONGE) ×2 IMPLANT
STAPLER VISISTAT 35W (STAPLE) ×2 IMPLANT
SUT ETHILON 2 0 FS 18 (SUTURE) IMPLANT
SUT ETHILON 3 0 PS 1 (SUTURE) IMPLANT
SUT ETHILON 5 0 PS 2 18 (SUTURE) ×1 IMPLANT
SUT ETHILON 6 0 9-3 1X18 BLK (SUTURE) ×1 IMPLANT
SUT MNCRL AB 4-0 PS2 18 (SUTURE) ×1 IMPLANT
SUT PLAIN GUT FAST 5-0 (SUTURE) ×2 IMPLANT
SUT SILK 2 0 SH CR/8 (SUTURE) ×2 IMPLANT
SUT SILK 3 0 REEL (SUTURE) ×2 IMPLANT
SUT SILK 3 0 SH 30 (SUTURE) IMPLANT
SUT SILK 3 0 SH CR/8 (SUTURE) ×1 IMPLANT
SUT SILK 4 0 REEL (SUTURE) IMPLANT
SUT VIC AB 3-0 SH 8-18 (SUTURE) ×1 IMPLANT
SUT VIC AB 4-0 PS2 18 (SUTURE) ×1 IMPLANT
SUT VIC AB 4-0 RB1 18 (SUTURE) ×2 IMPLANT
SUT VIC AB 4-0 SH 18 (SUTURE) IMPLANT
SUT VIC AB 5-0 PS2 18 (SUTURE) IMPLANT
SUT VICRYL 4-0 PS2 18IN ABS (SUTURE) ×1 IMPLANT
SYR 10ML LL (SYRINGE) ×1 IMPLANT
SYR TB 1ML LUER SLIP (SYRINGE) ×2 IMPLANT
TOWEL GREEN STERILE FF (TOWEL DISPOSABLE) ×2 IMPLANT
TRAY ENT MC OR (CUSTOM PROCEDURE TRAY) ×2 IMPLANT
TRAY FOLEY MTR SLVR 14FR STAT (SET/KITS/TRAYS/PACK) IMPLANT
WATER STERILE IRR 1000ML POUR (IV SOLUTION) ×2 IMPLANT

## 2023-12-12 NOTE — Transfer of Care (Signed)
Immediate Anesthesia Transfer of Care Note  [name]  Procedure(s) Performed: PAROTIDECTOMY (Face) CONTINUOUS NERVE MONITORING (Face)  Patient Location: PACU  Anesthesia Type:General  Level of Consciousness: awake, drowsy, and patient cooperative  Airway & Oxygen Therapy: Patient Spontanous Breathing  Post-op Assessment: Report given to RN, Post -op Vital signs reviewed and stable, and Patient moving all extremities X 4  Post vital signs: Reviewed and stable  Last Vitals:  Vitals Value Taken Time  BP 110/60 12/12/23 1901  Temp    Pulse 85 12/12/23 1902  Resp 19 12/12/23 1902  SpO2 90 % 12/12/23 1902  Vitals shown include unfiled device data.  Last Pain:  Vitals:   12/12/23 1237  PainSc: 0-No pain         Complications: No notable events documented.

## 2023-12-12 NOTE — H&P (Signed)
Pre-Operative H&P - Day Of Surgery Patient Name: Alicia Herrera Date:   12/12/2023  HPI: Alicia Herrera is a 78 y.o. female who presents today for operative treatment of left parotid mass. Patient denies recent significant changes to health or significant new medications or physiologic change in condition which would immediately impact plans. No new types of therapy has been initiated that would change the plan or the appropriateness of the plan. She stopped her ASA over 7 days ago  ROS:  A complete review of systems was obtained and is otherwise negative  PMH:  Past Medical History:  Diagnosis Date   Aortic stenosis due to bicuspid aortic valve 10/02/2013   s/p replacement of ascending TAA with Hemashield graft & AVR using 21 mm Magna Ease bioprosthesis ([facility])   Arthritis    CHF (congestive heart failure) (HCC) 2014   Coronary artery disease    s/pc combined 3V CABG/AVR 10/02/13   DVT (deep venous thrombosis) (HCC) 02/27/2023   post-TKA RLE DVT & PE 02/27/23, s/p 3 months Eliquis   Heart murmur    Hx of CABG    Hyperlipidemia 2014   Hypertension    Hypothyroidism    hx of 25 years ago per pt   PE (pulmonary thromboembolism) (HCC) 02/27/2023   provoked   Rectocele    Uterine prolapse     PSH:  Past Surgical History:  Procedure Laterality Date   CARDIAC CATHETERIZATION  08/27/2013   CARDIAC VALVE REPLACEMENT  09/2013   AVR   CORONARY ARTERY BYPASS GRAFT  09/2013   prolapsed uterus surgery      TONSILLECTOMY     Removed as a child   TOTAL KNEE ARTHROPLASTY Right 02/19/2023   Procedure: TOTAL KNEE ARTHROPLASTY;  Surgeon: Jodi Geralds, MD;  Location: [facility];  Service: Orthopedics;  Laterality: Right;   TUBAL LIGATION  1978    MEDS:   Current Facility-Administered Medications:    ceFAZolin (ANCEF) IVPB 2g/100 mL premix, 2 g, Intravenous, Once, Jovita Kussmaul B, MD   dexamethasone (DECADRON) injection 10 mg, 10 mg, Intravenous, Once, Jovita Kussmaul B, MD   lactated ringers infusion,  , Intravenous, Continuous, Lannie Fields, DO, Last Rate: 10 mL/hr at 12/12/23 1424, Continued from Pre-op at 12/12/23 1424  ALLERGIES: Patient has no known allergies.  EXAM: Vitals: BP 135/71   Pulse 72   Temp 98.4 F (36.9 C)   Resp 18   Ht 5\' 7"  (1.702 m)   Wt 83 kg   SpO2 96%   BMI 28.66 kg/m   General Awake, at baseline alertness.   HEENT No scleral icterus or conjunctival hemorrhage. Globe position appears normal. External ears  normal. Nose patent without rhinorrhea. No lymphadenopathy. No thyromegaly  Cardiovascular No cyanosis.  Pulmonary No audible stridor. Breathing easily with no labor.  Neuro Symmetric facial movement.   Psychiatry Appropriate affect and mood.  Skin No scars or lesions on face or neck.  Extermities Moves all extremities with normal range of motion.   Other Findings None. Facial nerve function intact (HB 1/6 all divisions)   Assessment & Plan: Alicia Herrera has diagnoses of left parotid mass and will go to the OR today for left superficial parotidectomy with nerve monitoring, possible neck dissetion. Informed consent was obtained and available in EMR today. All questions have been answered, and risks/benefits/alternatives of procedure as noted in the consent were discussed in a quiet area. Questions were invited and answered. The patient expressed understanding, provided consent and wished to proceed despite risks.  Alicia Herrera  Alicia Herrera 12/12/2023 2:40 PM

## 2023-12-12 NOTE — Op Note (Signed)
Otolaryngology Operative note  Alicia Herrera Date/Time of Admission: 12/12/2023 12:07 PM  CSN: [id number];MRN:[id number]  DOB: [birth year] Age: 78 y.o. Location: [facility] OR    Pre-Op Diagnosis: Left Parotid Mass  Post-Op Diagnosis: Same  Procedure: Procedure(s): Left Superficial Parotidectomy with Dissection and Preservation of Facial Nerve - CPT 42415  Surgeon: Jovita Kussmaul, MD  Anesthesia type:  GETA  Anesthesiologist: Anesthesiologist: Mariann Barter, MD; Gaynelle Adu, MD CRNA: Kayleen Memos, CRNA; Camillia Herter, CRNA   Staff: Circulator: Oswaldo Conroy, RN Relief Circulator: Ladona Mow, RN Relief Scrub: Virgel Bouquet, RN Scrub Person: Coralee North T RN First Assistant: Hermelinda Dellen, RN  Implants: * No implants in log *  Specimens: ID Type Source Tests Collected by Time Destination  1 : Left Superficial Parotid Gland Tissue PATH ENT excision SURGICAL PATHOLOGY Read Drivers, MD 12/12/2023 1805     EBL: 50cc  Drains: 10 Fr Round Suction Drain  Post-op disposition and condition: PACU, hemodynamically stable   Findings: Left parotid tumor in superficial lobe (3x3 cm) - tumor was quite close to overlying skin close to the lobule - no overlying skin invasion. No tumor spillage, excised en bloc - The mass was found to be well encapsulated. - Facial nerve was identified throughout the procedure and left intact. Facial nerve did stimulate well at the conclusion of the procedure.   Complications: None apparent  Indications and consent:  Alicia Herrera is a 78 y.o. female with diagnoses above. The patient's options were discussed, including risks/benefits/alternatives for each option. Patient expressed understanding, and despite these risks, consented and decided to proceed with above procedures. Informed consent was signed before proceeding.  OPERATIVE DETAILS:  After being properly identified in the preoperative holding area, the patient was  brought into the operating suite.  She was placed supine on the operating table.  A pre-procedural time-out was performed. The anesthesia team successfully induced General anesthesia. Peri-procedural antibiotics and steroids given. A shoulder roll was placed. The nerve integrity monitor was placed on this patient and its integrity was verified. The patient was then prepped and draped in usual sterile fashion.   A modified Blair incision was marked with a marking pen overlying a skin crease. An incision was then made with a #15 blade. The skin and subcutaneous tissues were divided and the superficial parotid fascia was identified. An anterior parotid skin flap was also elevated until the anterior border of the parotid gland was reached. Next, dissection was carried inferiorly until sternocleidomastoid attachments to skin were identified. The sternocleidomastoid muscle was then skeletonized at its anterior border and separated from the overlying parotid tail. The great auricular nerve was identified and ligated. Dissection was then performed superiorly along the sternocleidomastoid muscle up to the mastoid tip. We then proceeded superiorly, identifying and dissecting facial nerve identification landmarks, dissecting down the tragal cartilage and elevating the gland anteriorly.  The tympanic ring was palpated deep to the tragal pointer. Blunt dissection with a McCabe dissector was then performed along the tympanomastoid suture, parallel to the direction of the facial nerve. The main trunk of the facial nerve was ultimately identified and confirmed with stimulation with the Prass probe.   Next, the nerve was traced distally to the pes. The inferior division was initially followed and the overlying superficial lobe and tumor was divided taking care to protect the nerve as we proceeded. Deep lobe parotid tissue was not removed along with the superficial lobe as the gland was lifted anteriorly and inferiorly  surrounding the lesion. Dissection then proceeded along the  superior division, lifting the gland off the nerve from proximal to distal at the pes. This division was preserved and eventually the entire left parotid gland lesion was mobilized and sent to pathology with a cuff of normal parotid gland to prevent spillage. Deep lobe tissue was not removed from between lower division nerves. There was one small buccal branch going into the mass which was sacrificed.  Throughout dissection, hemostasis was achieved with the bipolar electrocautery and was satisfactory, as confirmed by valsalva maneuver. The wound was irrigated. The Prass probe was used to stimulate the main trunk of the facial nerve and branches stimulated. Surgicel SNoW was placed in the wound bed.  A 10 Frround suction drain was placed into the wound bed and brought out of the skin. 3-0 vicryl was use to close the platysma. Interrupted 4-0 Vicryls were used to close subcutaneous layers. A running 5-0 fast-absorbing gut suture was used to close the skin. Bacitracin ointment was applied. The patient was then awoken from anesthesia, extubated, and transported to PACU in stable condition without complications.   I was present for the entire procedure.  A RN first assistant was critical during the case for retraction, closure of wound, and ppropriate exposure for safe dissection of the facial nerve.  Read Drivers

## 2023-12-12 NOTE — Anesthesia Postprocedure Evaluation (Signed)
Anesthesia Post Note  Patient: Mihika Surrette  Procedure(s) Performed: PAROTIDECTOMY (Face) CONTINUOUS NERVE MONITORING (Face)     Patient location during evaluation: PACU Anesthesia Type: General Level of consciousness: awake and alert Pain management: pain level controlled Vital Signs Assessment: post-procedure vital signs reviewed and stable Respiratory status: spontaneous breathing, nonlabored ventilation and respiratory function stable Cardiovascular status: blood pressure returned to baseline and stable Postop Assessment: no apparent nausea or vomiting Anesthetic complications: no  No notable events documented.  Last Vitals:  Vitals:   12/12/23 2010 12/12/23 2025  BP:  (!) 98/58  Pulse: 76 74  Resp: 10 17  Temp: 36.9 C 36.6 C  SpO2: (!) 86% 93%    Last Pain:  Vitals:   12/12/23 2025  TempSrc: Oral  PainSc:                  Mckensi Redinger,W. EDMOND

## 2023-12-12 NOTE — Anesthesia Procedure Notes (Signed)
Procedure Name: Intubation Date/Time: 12/12/2023 2:50 PM  Performed by: Kayleen Memos, CRNAPre-anesthesia Checklist: Patient identified, Emergency Drugs available, Suction available and Patient being monitored Patient Re-evaluated:Patient Re-evaluated prior to induction Oxygen Delivery Method: Circle System Utilized Preoxygenation: Pre-oxygenation with 100% oxygen Induction Type: IV induction Ventilation: Mask ventilation without difficulty Laryngoscope Size: Mac and 3 Grade View: Grade I Tube type: Oral Tube size: 7.0 mm Number of attempts: 1 Airway Equipment and Method: Stylet and Oral airway Placement Confirmation: ETT inserted through vocal cords under direct vision, positive ETCO2 and breath sounds checked- equal and bilateral Secured at: 22 cm Tube secured with: Tape Dental Injury: Teeth and Oropharynx as per pre-operative assessment

## 2023-12-12 NOTE — Progress Notes (Signed)
Upon arrival to PACU, patient's oral temperature was 98.7 F.

## 2023-12-13 ENCOUNTER — Encounter (HOSPITAL_COMMUNITY): Payer: Self-pay | Admitting: Otolaryngology

## 2023-12-13 LAB — BASIC METABOLIC PANEL
Anion gap: 8 (ref 5–15)
BUN: 15 mg/dL (ref 8–23)
CO2: 23 mmol/L (ref 22–32)
Calcium: 8.5 mg/dL — ABNORMAL LOW (ref 8.9–10.3)
Chloride: 105 mmol/L (ref 98–111)
Creatinine, Ser: 0.98 mg/dL (ref 0.44–1.00)
GFR, Estimated: 59 mL/min — ABNORMAL LOW (ref >60)
Glucose, Bld: 146 mg/dL — ABNORMAL HIGH (ref 70–99)
Potassium: 3.3 mmol/L — ABNORMAL LOW (ref 3.5–5.1)
Sodium: 136 mmol/L (ref 135–145)

## 2023-12-13 MED ORDER — BACITRACIN ZINC 500 UNIT/GM EX OINT: 1.0000 | TOPICAL_OINTMENT | Freq: Two times a day (BID) | CUTANEOUS | 0 refills | Status: AC

## 2023-12-13 MED ORDER — OXYCODONE HCL 5 MG PO TABS: 5.0000 mg | ORAL_TABLET | ORAL | 0 refills | Status: AC | PRN

## 2023-12-13 MED ORDER — ACETAMINOPHEN 325 MG PO TABS: 650.0000 mg | ORAL_TABLET | ORAL | 2 refills | Status: DC | PRN

## 2023-12-13 NOTE — Progress Notes (Signed)
Reviewed AVS, patient expressed understanding of medications, MD follow up reviewed.  Removed IV, Site clean, dry and intact.  Pt transported to entrance A where family member was waiting in vehicle to transport home.

## 2023-12-13 NOTE — Plan of Care (Signed)
  Problem: Education: Goal: Knowledge of General Education information will improve Description: Including pain rating scale, medication(s)/side effects and non-pharmacologic comfort measures Outcome: Progressing   Problem: Health Behavior/Discharge Planning: Goal: Ability to manage health-related needs will improve Outcome: Progressing   Problem: Clinical Measurements: Goal: Ability to maintain clinical measurements within normal limits will improve Outcome: Progressing Goal: Will remain free from infection Outcome: Progressing Goal: Diagnostic test results will improve Outcome: Progressing Goal: Respiratory complications will improve Outcome: Progressing Goal: Cardiovascular complication will be avoided Outcome: Progressing   Problem: Activity: Goal: Risk for activity intolerance will decrease Outcome: Progressing   Problem: Nutrition: Goal: Adequate nutrition will be maintained Outcome: Progressing   Problem: Coping: Goal: Level of anxiety will decrease Outcome: Progressing   Problem: Elimination: Goal: Will not experience complications related to urinary retention Outcome: Progressing   Problem: Pain Managment: Goal: General experience of comfort will improve and/or be controlled Outcome: Progressing   Problem: Safety: Goal: Ability to remain free from injury will improve Outcome: Progressing   Problem: Skin Integrity: Goal: Risk for impaired skin integrity will decrease Outcome: Progressing   Problem: Activity: Goal: Ability to tolerate increased activity will improve Outcome: Progressing

## 2023-12-13 NOTE — Discharge Instructions (Addendum)
Surgery Discharge Instructions:  Call clinic or return to ED if you: - develop a fever greater than 101.4 - have shaking chills or are feeling ill - become short of breath - have uncontrollable nausea or vomiting - can't hold down food or liquids or feel as though you are getting dehydrated - have significant leakage or drainage from wound - urine output of less than 30cc/hr for 12 hours - develop significant redness, pain at incision(s) or wound opens up/separates - any other acute events, problems, or concerns  Wound Care/Dressings/Drain Instructions:  - To take care of your incision/cut:  - Apply bacitracin ointment to the incision twice per day for 7 days. Then switch to vaseline. - It is ok to shower in 48 hours, but avoid rubbing the incision or submerging under water like in a bath or swimming pool - Your stitches are absorbable and will dissolve on their own - The drain will stay in place until you see Dr. Allena Katz. The fluid from it can lighten over time to a kool aid or even straw/hay bale color which is expected. Empty and record how much comes out each day  Medications: - Resume your regular home medications except as detailed in the medication reconciliation. Resume your aspirin 325 starting on December 16, 2023 (Sunday) - For pain, take tylenol 650mg  every 4 hours  - do not take ibuprofen or aleeve. If that is not sufficient, a stronger pain medication (Oxycodone - 5mg  tablet every 4 hours as needed) has been prescribed to you. Do not mix with any other narcotic medication.  Follow Up:  - A follow up appointment should be scheduled for you after discharge with Dr. Allena Katz as seen in your discharge paperwork  Activity/Restrictions:  - Resume your regular activities, as tolerated. Avoid heavy lifting or straining (more than 5 lbs) for 10 days.  Diet: - Resume your regular diet, as tolerated  Additional Instructions: - Please take an over the counter stool softener while taking  narcotic pain medication - DO NOT MIX NARCOTIC PAIN MEDICATIONS OR TAKE NARCOTIC PRESCRIPTIONS AT THE SAME TIME - DO NOT DRIVE OR OPERATE HEAVY MACHINERY WHILE ON NARCOTICS  - DO NOT TAKE MORE THAN 4 GRAMS (4000mg ) OF TYLENOL (ACETAMINOPHEN) IN 24 HOURS  Department of Otolaryngology Contact Info: Otolaryngology Front Desk Phone including questions about appointments or questions: [phone] - If after normal business hours (Monday-Friday after 5PM or Weekends/Holidays), please call same number and follow prompts for Patient Access Line. There is a physician on call for urgent matters. For life threatening emergencies, please call 911

## 2023-12-13 NOTE — Discharge Summary (Addendum)
Physician Discharge Summary  Patient ID: Denea Cheaney MRN: [id number] DOB/AGE: Oct 24, 1946 77 y.o.  Admit date: 12/12/2023 Discharge date: 12/13/2023  Admission Diagnoses:  Principal Problem:   Mass of left parotid gland   Discharge Diagnoses:  Same  Surgeries: Procedure(s): Left superficial parotidectomy   Consultants: None  Discharged Condition: Stable  Hospital Course: Quinita Kostelecky is an 78 y.o. female who was admitted 12/12/2023 with a chief complaint of Left Parotid mass. They were brought to the operating room on 12/12/2023 and underwent the above named procedures which she tolerated well. Patient was observed in PACU and admitted overnight given comorbidities. She was seen POD 1 and had met her post-operative milestones. Her options were discussed, and she opted for discharge for which she appeared to be medically stable. BMP was checked given AKI on pre-op testing and was resolved with normal Cr. Discharge instructions reviewed. Resume ASA Dec 16 2023.  Physical Exam:  General: Awake and alert, no acute distress Neck: soft, flat, incision c/d/I, no evidence of hematoma; TLS drain in place Facial nerve function intact on right; on left, HB 2/6 all divisions but full eye closure and also some tone when closing eye completely, raising forehead and smile. Buccal branch weakest Nose: no purulence Respiratory: Respiratory effort is normal. Lungs clear to auscultation. No leg swelling or SOB  Recent vital signs:  Vitals:   12/13/23 0442 12/13/23 0807  BP: (!) 103/55 (!) 104/53  Pulse: 61 (!) 55  Resp: 16 18  Temp: 98 F (36.7 C) 98.1 F (36.7 C)  SpO2: 94% 94%    Recent laboratory studies:  Results for orders placed or performed during the hospital encounter of 12/12/23  Basic metabolic panel   Collection Time: 12/13/23  5:29 AM  Result Value Ref Range   Sodium 136 135 - 145 mmol/L   Potassium 3.3 (L) 3.5 - 5.1 mmol/L   Chloride 105 98 - 111 mmol/L   CO2 23 22 - 32  mmol/L   Glucose, Bld 146 (H) 70 - 99 mg/dL   BUN 15 8 - 23 mg/dL   Creatinine, Ser 4.09 0.44 - 1.00 mg/dL   Calcium 8.5 (L) 8.9 - 10.3 mg/dL   GFR, Estimated 59 (L) >60 mL/min   Anion gap 8 5 - 15    Discharge Medications:   Allergies as of 12/13/2023   No Known Allergies      Medication List     TAKE these medications    acetaminophen 325 MG tablet Commonly known as: Tylenol Take 2 tablets (650 mg total) by mouth every 4 (four) hours as needed.   amLODipine 5 MG tablet Commonly known as: NORVASC Take 1 tablet by mouth once daily   aspirin 325 MG tablet Take 1 tablet (325 mg total) by mouth daily. Start taking on: December 16, 2023 What changed: These instructions start on December 16, 2023. If you are unsure what to do until then, ask your doctor or other care provider.   bacitracin ointment Apply 1 Application topically 2 (two) times daily for 7 days.   hydrochlorothiazide 12.5 MG tablet Commonly known as: HYDRODIURIL Take 1 tablet (12.5 mg total) by mouth daily.   oxyCODONE 5 MG immediate release tablet Commonly known as: Oxy IR/ROXICODONE Take 1 tablet (5 mg total) by mouth every 4 (four) hours as needed for up to 7 days for severe pain (pain score 7-10) or moderate pain (pain score 4-6).   Repatha SureClick 140 MG/ML Soaj Generic drug: Evolocumab Inject 140 mg into  the skin every 14 (fourteen) days.        Diagnostic Studies: No results found.  Disposition: Discharge disposition: 01-Home or Self Care       Discharge Instructions     Diet general   Complete by: As directed    Increase activity slowly   Complete by: As directed        Resume ASA Dec 16 2023.    Signed: Read Drivers 12/13/2023, 10:58 AM

## 2023-12-14 ENCOUNTER — Telehealth: Payer: Self-pay

## 2023-12-14 LAB — SURGICAL PATHOLOGY

## 2023-12-14 NOTE — Transitions of Care (Post Inpatient/ED Visit) (Signed)
12/14/2023  Name: Alicia Herrera MRN: [id number] DOB: 06/16/1946  Today's TOC FU Call Status: Today's TOC FU Call Status:: Successful TOC FU Call Completed TOC FU Call Complete Date: 12/14/23 Patient's Name and Date of Birth confirmed.  Transition Care Management Follow-up Telephone Call Date of Discharge: 12/13/23 Discharge Facility: [facility]) Type of Discharge: Inpatient Admission Primary Inpatient Discharge Diagnosis:: Mass of Left Parotid Gland How have you been since you were released from the hospital?: Better (Taking tylenol for pain) Any questions or concerns?: No  Items Reviewed: Did you receive and understand the discharge instructions provided?: Yes Medications obtained,verified, and reconciled?: Yes (Medications Reviewed) Any new allergies since your discharge?: No Dietary orders reviewed?: No Do you have support at home?: Yes People in Home: child(ren), adult Name of Support/Comfort Primary Source: Becky  Medications Reviewed Today: Medications Reviewed Today     Reviewed by Jodelle Gross, RN (Case Manager) on 12/14/23 at 1414  Med List Status: <None>   Medication Order Taking? Sig Documenting Provider Last Dose Status Informant  acetaminophen (TYLENOL) 325 MG tablet [id number] Yes Take 2 tablets (650 mg total) by mouth every 4 (four) hours as needed. Read Drivers, MD Taking Active   amLODipine [facility]) 5 MG tablet [id number] Yes Take 1 tablet by mouth once daily [name], DO Taking Active Self  aspirin 325 MG tablet [id number] Yes Take 1 tablet (325 mg total) by mouth daily. Read Drivers, MD Taking Active   bacitracin ointment [id number] Yes Apply 1 Application topically 2 (two) times daily for 7 days. Read Drivers, MD Taking Active   Evolocumab [facility]) 140 MG/ML Ivory Broad [id number] Yes Inject 140 mg into the skin every 14 (fourteen) days. [provider] Taking Active Self           Med Note Modesto Charon   Mon May 21, 2023   1:47 PM)    hydrochlorothiazide (HYDRODIURIL) 12.5 MG tablet [id number] Yes Take 1 tablet (12.5 mg total) by mouth daily. [name], DO Taking Active Self  oxyCODONE (OXY IR/ROXICODONE) 5 MG immediate release tablet [id number] No Take 1 tablet (5 mg total) by mouth every 4 (four) hours as needed for up to 7 days for severe pain (pain score 7-10) or moderate pain (pain score 4-6).  Patient not taking: Reported on 12/14/2023   Read Drivers, MD Not Taking Active            Med Note Electa Sniff, [facility]   Fri Dec 14, 2023  2:14 PM) Tylenol covering pain            Home Care and Equipment/Supplies: Were Home Health Services Ordered?: No Any new equipment or medical supplies ordered?: No  Functional Questionnaire: Do you need assistance with bathing/showering or dressing?: No Do you need assistance with meal preparation?: No Do you need assistance with eating?: No Do you have difficulty maintaining continence: No Do you need assistance with getting out of bed/getting out of a chair/moving?: No Do you have difficulty managing or taking your medications?: No  Follow up appointments reviewed: PCP Follow-up appointment confirmed?: NA Specialist Hospital Follow-up appointment confirmed?: Yes Date of Specialist follow-up appointment?: 12/18/23 Follow-Up Specialty Provider:: [facility] Do you need transportation to your follow-up appointment?: No Do you understand care options if your condition(s) worsen?: Yes-patient verbalized understanding  SDOH Interventions Today    Flowsheet Row Most Recent Value  SDOH Interventions   Food Insecurity Interventions Intervention Not Indicated  Housing Interventions Intervention Not Indicated  Transportation  Interventions Intervention Not Indicated  Utilities Interventions Intervention Not Indicated     Jodelle Gross RN, BSN, CCM [place]  [facility] Manager Population Health Direct Dial: [phone]  Fax:  [phone]

## 2023-12-17 ENCOUNTER — Telehealth (INDEPENDENT_AMBULATORY_CARE_PROVIDER_SITE_OTHER): Payer: Self-pay | Admitting: Otolaryngology

## 2023-12-17 NOTE — Telephone Encounter (Signed)
Confirmed appt & location [id number]

## 2023-12-18 ENCOUNTER — Ambulatory Visit (INDEPENDENT_AMBULATORY_CARE_PROVIDER_SITE_OTHER): Payer: Medicare HMO | Admitting: Otolaryngology

## 2023-12-18 ENCOUNTER — Encounter (INDEPENDENT_AMBULATORY_CARE_PROVIDER_SITE_OTHER): Payer: Self-pay

## 2023-12-18 VITALS — BP 116/78 | HR 83

## 2023-12-18 DIAGNOSIS — K118 Other diseases of salivary glands: Secondary | ICD-10-CM

## 2023-12-18 DIAGNOSIS — Z9889 Other specified postprocedural states: Secondary | ICD-10-CM

## 2023-12-18 NOTE — Progress Notes (Signed)
 ENT Post-op visit: S/p Left parotidectomy 12/12/2023 S: Doing well, minimal drain output. She reports that she thinks that her facial function is improving. No dry eye sx. Vitals:   12/18/23 1503  BP: 116/78  Pulse: 83  SpO2: 95%   O:Left parotidectomy incision intact, minimal erythema surrounding; drain with serous fluid, removed; incision c/d/I; Facial nerve function - forehead function symmetric; full eye closure and relatively symmetric squint but blink slightly slower; nasal branches modestly weak, marginal mandibular nerve appears intact. No leg swelling A/P: 78 y.o. F w/ left Parotid mass s/p left superficial parotidectomy Doing well, facial nerve function weak, but improving. Pathology discussed - pleomorphic adenoma F/u 3-4 months for recheck Call if any concerns Ok to shower

## 2023-12-26 ENCOUNTER — Encounter (INDEPENDENT_AMBULATORY_CARE_PROVIDER_SITE_OTHER): Payer: Medicare HMO

## 2024-01-10 DIAGNOSIS — M25561 Pain in right knee: Secondary | ICD-10-CM | POA: Diagnosis not present

## 2024-01-23 ENCOUNTER — Ambulatory Visit: Admit: 2024-01-23 | Payer: Medicare HMO

## 2024-01-23 SURGERY — EXCISION, PAROTID GLAND
Anesthesia: Choice

## 2024-03-25 ENCOUNTER — Ambulatory Visit (INDEPENDENT_AMBULATORY_CARE_PROVIDER_SITE_OTHER): Payer: Medicare HMO | Admitting: Otolaryngology

## 2024-04-01 ENCOUNTER — Encounter (INDEPENDENT_AMBULATORY_CARE_PROVIDER_SITE_OTHER): Payer: Self-pay | Admitting: Otolaryngology

## 2024-04-01 ENCOUNTER — Ambulatory Visit (INDEPENDENT_AMBULATORY_CARE_PROVIDER_SITE_OTHER): Admitting: Otolaryngology

## 2024-04-01 VITALS — BP 151/85 | HR 96 | Ht 67.0 in | Wt 186.0 lb

## 2024-04-01 DIAGNOSIS — K115 Sialolithiasis: Secondary | ICD-10-CM | POA: Diagnosis not present

## 2024-04-01 DIAGNOSIS — K118 Other diseases of salivary glands: Secondary | ICD-10-CM | POA: Diagnosis not present

## 2024-04-01 NOTE — Progress Notes (Signed)
 Dear Dr. Augustus Ledger, Here is my assessment for our mutual patient, Alicia Herrera. Thank you for allowing me the opportunity to care for your patient. Please do not hesitate to contact me should you have any other questions. Sincerely, Dr. Milon Aloe  Otolaryngology Clinic Note Referring provider: Dr. Augustus Ledger HPI:  Alicia Herrera is a 78 y.o. female kindly referred by Dr. Augustus Ledger for evaluation of left parotid mass.  Initial visit (09/2023): Noticed it two years ago. Bigger now. Not painful. No numbness. No skin cancers. Had an MRI two months ago for vertigo (resolved), and noted to have parotid mass. Never been biopsied.  No prior H&N or skin cancers. No ear pain, no odynophagia, no dysphagia. No hemoptysis, neck masses.  On Evolocumab  S/p L superficial parotidectomy 12/12/2023 --------------------------------------------------------- 04/01/2024 Seen in follow up. Doing very well. She reports her facial function has returned except mild left upper lip weakness. Some ear numbness. She denies any pain, swelling or issues with gustatory sweating.   H&N Surgery: L Superficial parotidectomy Personal or FHx of bleeding dz or anesthesia difficulty: no  GLP-1: no AP/AC: on ASA 325 (since heart surgery)  PMHx: HLD, HTN, CAD s/p CABG and ascending aortic aneurysm repair (2014), PE (April 2024 - post op after knee surgery)  Tobacco: no. Alcohol: no. Occupation: Work at [employer], pre-school for 14 years. Lives in [place], [place].  Independent Review of Additional Tests or Records:  MRI 07/23/2023:   No retrocochlear lesions  CT 10/01/2023 indepenently reviewed - ~2.5 cm left parotid mass, no lymph nodes. 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  PMH/Meds/All/SocHx/FamHx/ROS:   Past Medical History:  Diagnosis Date   Aortic stenosis due to bicuspid aortic valve 10/02/2013   s/p replacement of ascending TAA with Hemashield graft & AVR using 21 mm Magna  Ease bioprosthesis ([facility])   Arthritis    CHF (congestive heart failure) (HCC) 2014   Coronary artery disease    s/pc combined 3V CABG/AVR 10/02/13   DVT (deep venous thrombosis) (HCC) 02/27/2023   post-TKA RLE DVT & PE 02/27/23, s/p 3 months Eliquis    Heart murmur    Hx of CABG    Hyperlipidemia 2014   Hypertension    Hypothyroidism    hx of 25 years ago per pt   PE (pulmonary thromboembolism) (HCC) 02/27/2023   provoked   Rectocele    Uterine prolapse      Past Surgical History:  Procedure Laterality Date   CARDIAC CATHETERIZATION  08/27/2013   CARDIAC VALVE REPLACEMENT  09/2013   AVR   CONTINUOUS NERVE MONITORING N/A 12/12/2023   Procedure: CONTINUOUS NERVE MONITORING;  Surgeon: Evelina Hippo, MD;  Location: [facility] OR;  Service: ENT;  Laterality: N/A;   CORONARY ARTERY BYPASS GRAFT  09/2013   PAROTIDECTOMY N/A 12/12/2023   Procedure: PAROTIDECTOMY;  Surgeon: Evelina Hippo, MD;  Location: [facility] OR;  Service: ENT;  Laterality: N/A;   prolapsed uterus surgery      TONSILLECTOMY     Removed as a child   TOTAL KNEE ARTHROPLASTY Right 02/19/2023   Procedure: TOTAL KNEE ARTHROPLASTY;  Surgeon: Neil Balls, MD;  Location: [facility] ORS;  Service: Orthopedics;  Laterality: Right;   TUBAL LIGATION  1978    Family History  Problem Relation Age of Onset   Hypertension Mother    Heart attack Mother    Birth defects Mother    Cancer Father    Penile cancer Father    Diabetes Sister    Varicose Veins Maternal  Grandmother    Diabetes Brother    Varicose Veins Maternal Aunt      Social Connections: Moderately Integrated (10/28/2023)   Social Connection and Isolation Panel [NHANES]    Frequency of Communication with Friends and Family: Twice a week    Frequency of Social Gatherings with Friends and Family: Once a week    Attends Religious Services: More than 4 times per year    Active Member of [employer] or Organizations: Yes    Attends Banker Meetings: More than 4 times per  year    Marital Status: Widowed      Current Outpatient Medications:    acetaminophen  (TYLENOL ) 325 MG tablet, Take 2 tablets (650 mg total) by mouth every 4 (four) hours as needed., Disp: 100 tablet, Rfl: 2   amLODipine  (NORVASC ) 5 MG tablet, Take 1 tablet by mouth once daily, Disp: 90 tablet, Rfl: 1   aspirin  325 MG tablet, Take 1 tablet (325 mg total) by mouth daily., Disp: , Rfl:    Evolocumab (REPATHA SURECLICK) 140 MG/ML SOAJ, Inject 140 mg into the skin every 14 (fourteen) days., Disp: , Rfl:    hydrochlorothiazide  (HYDRODIURIL ) 12.5 MG tablet, Take 1 tablet (12.5 mg total) by mouth daily., Disp: 90 tablet, Rfl: 1   Physical Exam:   BP (!) 151/85 (BP Location: Right Arm, Patient Position: Sitting, Cuff Size: Normal)   Pulse 96   Ht 5\' 7"  (1.702 m)   Wt 186 lb (84.4 kg)   SpO2 94%   BMI 29.13 kg/m   Salient findings:  CN II-XII intact EXCEPT - Left forehead function symmetric, full eye closure and symmetric; slight weakness left upper lip with marginal mandibular nerve intact.   Bilateral EAC clear and TM intact with well pneumatized middle ear spaces No obviously palpable neck masses/lymphadenopathy/thyromegaly; left neck incision well healed No respiratory distress or stridor  Seprately Identifiable Procedures:  None  Impression & Plans:  Alicia Herrera is a 78 y.o. female with:  1. Parotid mass   2. Submandibular sialolithiasis    S/p left superficial parotidectomy on 12/12/2023. Doing well.  Submandibular sialoliathiasis - no infections so after discussion of options, will observe. Advised to call back if any issues F/u PRN   Thank you for allowing me the opportunity to care for your patient. Please do not hesitate to contact me should you have any other questions.  Sincerely, Milon Aloe, MD Otolaryngologist (ENT), [facility] Phone: [phone] Fax: [phone]  04/01/2024, 3:08 PM   MDM:  Level 3 - 99213 Complexity/Problems  addressed: low Data complexity: low - Morbidity: low

## 2024-05-22 ENCOUNTER — Other Ambulatory Visit: Payer: Self-pay | Admitting: Family Medicine

## 2024-05-22 NOTE — Telephone Encounter (Signed)
 Pls contact pt to schedule HTN appt with Dr. Alvia. Sending 30 day med refill. Thx.

## 2024-05-23 NOTE — Telephone Encounter (Signed)
 Scheduled

## 2024-05-29 ENCOUNTER — Ambulatory Visit

## 2024-05-29 VITALS — Ht 67.0 in | Wt 180.0 lb

## 2024-05-29 DIAGNOSIS — Z0000 Encounter for general adult medical examination without abnormal findings: Secondary | ICD-10-CM

## 2024-05-29 NOTE — Progress Notes (Signed)
 Subjective:   Alicia Herrera is a 78 y.o. female who presents for Medicare Annual (Subsequent) preventive examination.  Visit Complete: Virtual I connected with  Alicia Herrera on 05/29/24 by a audio enabled telemedicine application and verified that I am speaking with the correct person using two identifiers.  Patient Location: Home  Provider Location: Office/Clinic  I discussed the limitations of evaluation and management by telemedicine. The patient expressed understanding and agreed to proceed.  Vital Signs: Because this visit was a virtual/telehealth visit, some criteria may be missing or patient reported. Any vitals not documented were not able to be obtained and vitals that have been documented are patient reported.  Patient Medicare AWV questionnaire was completed by the patient on 05/29/2024; I have confirmed that all information answered by patient is correct and no changes since this date.  Cardiac Risk Factors include: hypertension;advanced age (>101men, >85 women);dyslipidemia;family history of premature cardiovascular disease     Objective:    Today's Vitals   05/29/24 1302  Weight: 180 lb (81.6 kg)  Height: 5' 7 (1.702 m)   Body mass index is 28.19 kg/m.     05/29/2024    1:11 PM 12/12/2023   12:38 PM 12/07/2023    1:12 PM 05/21/2023    1:55 PM 03/13/2023    1:20 PM 02/28/2023    4:14 AM 02/19/2023    5:47 AM  Advanced Directives  Does Patient Have a Medical Advance Directive? Yes Yes Yes Yes Yes Yes Yes  Type of Estate agent of [place];Living will Healthcare Power of [place];Living will Healthcare Power of [place];Living will Healthcare Power of [place];Living will Healthcare Power of [place];Living will Healthcare Power of [place];Living will Healthcare Power of [place];Living will  Does patient want to make changes to medical advance directive? No - Patient declined   No - Patient declined No - Patient declined No - Patient declined No  - Patient declined  Copy of Healthcare Power of Attorney in Chart? No - copy requested No - copy requested No - copy requested No - copy requested No - copy requested No - copy requested No - copy requested    Current Medications (verified) Outpatient Encounter Medications as of 05/29/2024  Medication Sig   amLODipine  (NORVASC ) 5 MG tablet Take 1 tablet by mouth once daily   aspirin  325 MG tablet Take 1 tablet (325 mg total) by mouth daily.   Evolocumab (REPATHA SURECLICK) 140 MG/ML SOAJ Inject 140 mg into the skin every 14 (fourteen) days.   hydrochlorothiazide  (HYDRODIURIL ) 12.5 MG tablet Take 1 tablet (12.5 mg total) by mouth daily.   acetaminophen  (TYLENOL ) 325 MG tablet Take 2 tablets (650 mg total) by mouth every 4 (four) hours as needed. (Patient not taking: Reported on 05/29/2024)   No facility-administered encounter medications on file as of 05/29/2024.    Allergies (verified) Patient has no known allergies.   History: Past Medical History:  Diagnosis Date   Aortic stenosis due to bicuspid aortic valve 10/02/2013   s/p replacement of ascending TAA with Hemashield graft & AVR using 21 mm Magna Ease bioprosthesis ([facility])   Arthritis    CHF (congestive heart failure) (HCC) 2014   Coronary artery disease    s/pc combined 3V CABG/AVR 10/02/13   DVT (deep venous thrombosis) (HCC) 02/27/2023   post-TKA RLE DVT & PE 02/27/23, s/p 3 months Eliquis    Heart murmur    Hx of CABG    Hyperlipidemia 2014   Hypertension    Hypothyroidism  hx of 25 years ago per pt   PE (pulmonary thromboembolism) (HCC) 02/27/2023   provoked   Rectocele    Uterine prolapse    Past Surgical History:  Procedure Laterality Date   CARDIAC CATHETERIZATION  08/27/2013   CARDIAC VALVE REPLACEMENT  09/2013   AVR   CONTINUOUS NERVE MONITORING N/A 12/12/2023   Procedure: CONTINUOUS NERVE MONITORING;  Surgeon: Tobie Eldora NOVAK, MD;  Location[facility] OR;  Service: ENT;  Laterality: N/A;   CORONARY ARTERY BYPASS  GRAFT  09/2013   PAROTIDECTOMY N/A 12/12/2023   Procedure: PAROTIDECTOMY;  Surgeon: Tobie Eldora NOVAK, MD;  Location: [facility] OR;  Service: ENT;  Laterality: N/A;   prolapsed uterus surgery      TONSILLECTOMY     Removed as a child   TOTAL KNEE ARTHROPLASTY Right 02/19/2023   Procedure: TOTAL KNEE ARTHROPLASTY;  Surgeon: Yvone Rush, MD;  Location: [facility] ORS;  Service: Orthopedics;  Laterality: Right;   TUBAL LIGATION  1978   Family History  Problem Relation Age of Onset   Hypertension Mother    Heart attack Mother    Birth defects Mother    Cancer Father    Penile cancer Father    Diabetes Sister    Varicose Veins Maternal Grandmother    Diabetes Brother    Varicose Veins Maternal Aunt    Social History   Socioeconomic History   Marital status: Widowed    Spouse name: Not on file   Number of children: 4   Years of education: 17   Highest education level: Bachelor's degree (e.g., BA, AB, BS)  Occupational History   Occupation: Retired   Occupation: Works part time    Comment: 3  mornings at the preschool   Occupation: Words part-time at [employer]  Tobacco Use   Smoking status: Never   Smokeless tobacco: Never   Tobacco comments:    father smoked  Vaping Use   Vaping status: Never Used  Substance and Sexual Activity   Alcohol use: Not Currently    Alcohol/week: 1.0 standard drink of alcohol    Types: 1 Glasses of wine per week    Comment: Occasionally   Drug use: Never   Sexual activity: Not Currently    Birth control/protection: Abstinence    Comment: widowed  Other Topics Concern   Not on file  Social History Narrative   Lives alone but two of her children live close by. She sees her family every day. She also has a puppy. She has not been able to exercise to due her knees.    Social Drivers of Corporate investment banker Strain: Low Risk  (05/29/2024)   Overall Financial Resource Strain (CARDIA)    Difficulty of Paying Living Expenses: Not hard at all  Food  Insecurity: No Food Insecurity (05/29/2024)   Hunger Vital Sign    Worried About Running Out of Food in the Last Year: Never true    Ran Out of Food in the Last Year: Never true  Transportation Needs: No Transportation Needs (05/29/2024)   PRAPARE - Administrator, Civil Service (Medical): No    Lack of Transportation (Non-Medical): No  Recent Concern: Transportation Needs - Unmet Transportation Needs (05/29/2024)   PRAPARE - Transportation    Lack of Transportation (Medical): Yes    Lack of Transportation (Non-Medical): Yes  Physical Activity: Insufficiently Active (05/29/2024)   Exercise Vital Sign    Days of Exercise per Week: 7 days    Minutes of Exercise  per Session: 20 min  Stress: No Stress Concern Present (05/29/2024)   [employer] of Occupational Health - Occupational Stress Questionnaire    Feeling of Stress: Not at all  Recent Concern: Stress - Stress Concern Present (05/29/2024)   [employer] of Occupational Health - Occupational Stress Questionnaire    Feeling of Stress: To some extent  Social Connections: Moderately Integrated (05/29/2024)   Social Connection and Isolation Panel    Frequency of Communication with Friends and Family: More than three times a week    Frequency of Social Gatherings with Friends and Family: Twice a week    Attends Religious Services: More than 4 times per year    Active Member of [employer] or Organizations: Yes    Attends Banker Meetings: More than 4 times per year    Marital Status: Widowed    Tobacco Counseling Counseling given: Not Answered Tobacco comments: father smoked   Clinical Intake:  Pre-visit preparation completed: Yes  Pain : No/denies pain     BMI - recorded: 28.19 Nutritional Status: BMI 25 -29 Overweight Nutritional Risks: None Diabetes: No  How often do you need to have someone help you when you read instructions, pamphlets, or other written materials from your doctor or pharmacy?:  1 - Never What is the last grade level you completed in school?: 18  Interpreter Needed?: No      Activities of Daily Living    05/29/2024    1:04 PM 05/29/2024    7:00 AM  In your present state of health, do you have any difficulty performing the following activities:  Hearing? 0 0  Vision? 0 0  Difficulty concentrating or making decisions? 0 0  Walking or climbing stairs? 0 0  Dressing or bathing? 0 0  Doing errands, shopping? 0 0  Preparing Food and eating ? N N  Using the Toilet? N N  In the past six months, have you accidently leaked urine? N N  Do you have problems with loss of bowel control? N N  Managing your Medications? N N  Managing your Finances? N N  Housekeeping or managing your Housekeeping? N N    Patient Care Team: Alvia Bring, DO as PCP - General (Family Medicine) Dorisann Norleen Sharper, MD as Referring Physician (Internal Medicine)  Indicate any recent Medical Services you may have received from other than [facility] providers in the past year (date may be approximate).     Assessment:   This is a routine wellness examination for Alicia Herrera.  Hearing/Vision screen No results found.   Goals Addressed             This Visit's Progress    Patient Stated       Patient would like to stay active and healthy.        Depression Screen    05/29/2024    1:09 PM 11/22/2023    8:24 AM 05/21/2023    1:50 PM 11/20/2022    8:26 AM 05/18/2022    8:26 AM 05/15/2022    3:04 PM 11/18/2021    8:55 AM  PHQ 2/9 Scores  PHQ - 2 Score 0 0 0 0 1 0 0    Fall Risk    05/29/2024    1:11 PM 05/29/2024    7:00 AM 11/22/2023    8:24 AM 05/21/2023    1:50 PM 05/18/2023    2:49 PM  Fall Risk   Falls in the past year? 0 0 0 0 0  Number  falls in past yr: 0 0 0 0 0  Injury with Fall? 0 0 0 0 0  Risk for fall due to : No Fall Risks  No Fall Risks No Fall Risks   Follow up Falls evaluation completed  Falls evaluation completed Falls evaluation completed     MEDICARE RISK AT  HOME: Medicare Risk at Home Any stairs in or around the home?: Yes If so, are there any without handrails?: No Home free of loose throw rugs in walkways, pet beds, electrical cords, etc?: No Adequate lighting in your home to reduce risk of falls?: Yes Life alert?: No Use of a cane, walker or w/c?: No Grab bars in the bathroom?: Yes Shower chair or bench in shower?: Yes Elevated toilet seat or a handicapped toilet?: Yes  TIMED UP AND GO:  Was the test performed?  No    Cognitive Function:        05/29/2024    1:12 PM 05/21/2023    1:56 PM 05/15/2022    3:09 PM 01/17/2021    4:21 PM  6CIT Screen  What Year? 0 points 0 points 0 points 0 points  What month? 0 points 0 points 0 points 0 points  What time? 0 points 0 points 0 points 0 points  Count back from 20 0 points 0 points 0 points 0 points  Months in reverse 0 points 0 points 0 points 0 points  Repeat phrase 0 points 0 points 0 points 0 points  Total Score 0 points 0 points 0 points 0 points    Immunizations Immunization History  Administered Date(s) Administered   PFIZER(Purple Top)SARS-COV-2 Vaccination 01/14/2020, 02/13/2020    TDAP status: Due, Education has been provided regarding the importance of this vaccine. Advised may receive this vaccine at local pharmacy or Health Dept. Aware to provide a copy of the vaccination record if obtained from local pharmacy or Health Dept. Verbalized acceptance and understanding.  Flu Vaccine status: Declined, Education has been provided regarding the importance of this vaccine but patient still declined. Advised may receive this vaccine at local pharmacy or Health Dept. Aware to provide a copy of the vaccination record if obtained from local pharmacy or Health Dept. Verbalized acceptance and understanding.  Pneumococcal vaccine status: Declined,  Education has been provided regarding the importance of this vaccine but patient still declined. Advised may receive this vaccine at local  pharmacy or Health Dept. Aware to provide a copy of the vaccination record if obtained from local pharmacy or Health Dept. Verbalized acceptance and understanding.   Covid-19 vaccine status: Declined, Education has been provided regarding the importance of this vaccine but patient still declined. Advised may receive this vaccine at local pharmacy or Health Dept.or vaccine clinic. Aware to provide a copy of the vaccination record if obtained from local pharmacy or Health Dept. Verbalized acceptance and understanding.  Qualifies for Shingles Vaccine? Yes   Zostavax completed No   Shingrix Completed?: No.    Education has been provided regarding the importance of this vaccine. Patient has been advised to call insurance company to determine out of pocket expense if they have not yet received this vaccine. Advised may also receive vaccine at local pharmacy or Health Dept. Verbalized acceptance and understanding.  Screening Tests Health Maintenance  Topic Date Due   Hepatitis C Screening  Never done   Pneumococcal Vaccine: 50+ Years (1 of 2 - PCV) Never done   DEXA SCAN  Never done   COVID-19 Vaccine (3 - Pfizer risk  series) 08/03/2024 (Originally 03/12/2020)   Zoster Vaccines- Shingrix (1 of 2) 11/21/2024 (Originally 01/20/1965)   INFLUENZA VACCINE  06/13/2024   Medicare Annual Wellness (AWV)  05/29/2025   Hepatitis B Vaccines  Aged Out   HPV VACCINES  Aged Out   Meningococcal B Vaccine  Aged Out   DTaP/Tdap/Td  Discontinued    Health Maintenance  Health Maintenance Due  Topic Date Due   Hepatitis C Screening  Never done   Pneumococcal Vaccine: 50+ Years (1 of 2 - PCV) Never done   DEXA SCAN  Never done    Colorectal cancer screening: No longer required.   Mammogram status: No longer required due to age.  Bone Density never done. Patient declined.   Lung Cancer Screening: (Low Dose CT Chest recommended if Age 44-80 years, 20 pack-year currently smoking OR have quit w/in 15years.) does  not qualify.   Lung Cancer Screening Referral: n/a  Additional Screening:  Hepatitis C Screening: does not qualify; Completed   Vision Screening: Recommended annual ophthalmology exams for early detection of glaucoma and other disorders of the eye. Is the patient up to date with their annual eye exam?  Yes  Who is the provider or what is the name of the office in which the patient attends annual eye exams? [facility] If pt is not established with a provider, would they like to be referred to a provider to establish care? N/a.   Dental Screening: Recommended annual dental exams for proper oral hygiene   Community Resource Referral / Chronic Care Management: CRR required this visit?  No   CCM required this visit?  No     Plan:     I have personally reviewed and noted the following in the patient's chart:   Medical and social history Use of alcohol, tobacco or illicit drugs  Current medications and supplements including opioid prescriptions. Patient is not currently taking opioid prescriptions. Functional ability and status Nutritional status Physical activity Advanced directives List of other physicians Hospitalizations # 1, surgeries # 1, and ER visits in previous 12 months Vitals Screenings to include cognitive, depression, and falls Referrals and appointments  In addition, I have reviewed and discussed with patient certain preventive protocols, quality metrics, and best practice recommendations. A written personalized care plan for preventive services as well as general preventive health recommendations were provided to patient.     Alicia Herrera, CMA   05/29/2024   After Visit Summary: (MyChart) Due to this being a telephonic visit, the after visit summary with patients personalized plan was offered to patient via MyChart   Nurse Notes:   Alicia Herrera is a 78 y.o. female patient of Alvia Bring, DO who had a Medicare Annual Wellness Visit today via  telephone. Vung is working part-time and lives alone. She has 4 children. She reports that she is socially active and does interact with friends/family regularly. She is moderately physically active and enjoys spending time with the grandchildren.

## 2024-05-29 NOTE — Patient Instructions (Signed)
  Alicia Herrera , Thank you for taking time to come for your Medicare Wellness Visit. I appreciate your ongoing commitment to your health goals. Please review the following plan we discussed and let me know if I can assist you in the future.   These are the goals we discussed:  Goals       Patient Stated (pt-stated)      01/17/2021 AWV Goal: Improved Nutrition/Diet  Patient will verbalize understanding that diet plays an important role in overall health and that a poor diet is a risk factor for many chronic medical conditions.  Over the next year, patient will improve self management of their diet by incorporating more water. Patient will utilize available community resources to help with food acquisition if needed (ex: food pantries, [address], etc) Patient will work with nutrition specialist if a referral was made       Patient Stated (pt-stated)      Get in better shape      Patient Stated (pt-stated)      Patient stated that she would like to continue to maintain her current lifestyle and be active and independent.      Patient Stated      Patient would like to stay active and healthy.         This is a list of the screening recommended for you and due dates:  Health Maintenance  Topic Date Due   Hepatitis C Screening  Never done   Pneumococcal Vaccine for age over 100 (1 of 2 - PCV) Never done   DEXA scan (bone density measurement)  Never done   COVID-19 Vaccine (3 - Pfizer risk series) 08/03/2024*   Zoster (Shingles) Vaccine (1 of 2) 11/21/2024*   Flu Shot  06/13/2024   Medicare Annual Wellness Visit  05/29/2025   Hepatitis B Vaccine  Aged Out   HPV Vaccine  Aged Out   Meningitis B Vaccine  Aged Out   DTaP/Tdap/Td vaccine  Discontinued  *Topic was postponed. The date shown is not the original due date.

## 2024-06-06 ENCOUNTER — Ambulatory Visit (INDEPENDENT_AMBULATORY_CARE_PROVIDER_SITE_OTHER): Admitting: Family Medicine

## 2024-06-06 ENCOUNTER — Encounter: Payer: Self-pay | Admitting: Family Medicine

## 2024-06-06 VITALS — BP 127/78 | HR 72 | Ht 67.0 in | Wt 184.0 lb

## 2024-06-06 DIAGNOSIS — E785 Hyperlipidemia, unspecified: Secondary | ICD-10-CM

## 2024-06-06 DIAGNOSIS — I10 Essential (primary) hypertension: Secondary | ICD-10-CM | POA: Diagnosis not present

## 2024-06-06 NOTE — Progress Notes (Signed)
 Alicia Herrera - 78 y.o. female MRN [id number]  Date of birth: July 21, 1946  Subjective Chief Complaint  Patient presents with   Hypertension    HPI Alicia Herrera is a 78 y.o. female here today for follow up visit.    She reports that she is doing well.  BP remains well controlled with amlodipine  and hydrochlorothiazide .  Denies side effects at current strength.  She has not had chest pain, shortness of breath, palpitations, headache or vision changes.    She remains on repatha for management of lipids.  She was intolerant of statins.  She has history of CABG due to aortic aneurysm repair.    ROS:  A comprehensive ROS was completed and negative except as noted per HPI  No Known Allergies  Past Medical History:  Diagnosis Date   Aortic stenosis due to bicuspid aortic valve 10/02/2013   s/p replacement of ascending TAA with Hemashield graft & AVR using 21 mm Magna Ease bioprosthesis ([facility])   Arthritis    CHF (congestive heart failure) (HCC) 2014   Coronary artery disease    s/pc combined 3V CABG/AVR 10/02/13   DVT (deep venous thrombosis) (HCC) 02/27/2023   post-TKA RLE DVT & PE 02/27/23, s/p 3 months Eliquis    Heart murmur    Hx of CABG    Hyperlipidemia 2014   Hypertension    Hypothyroidism    hx of 25 years ago per pt   Myocardial infarction [facility]) 2014   PE (pulmonary thromboembolism) (HCC) 02/27/2023   provoked   Rectocele    Uterine prolapse     Past Surgical History:  Procedure Laterality Date   CARDIAC CATHETERIZATION  08/27/2013   CARDIAC VALVE REPLACEMENT  09/2013   AVR   CONTINUOUS NERVE MONITORING N/A 12/12/2023   Procedure: CONTINUOUS NERVE MONITORING;  Surgeon: Tobie Eldora NOVAK, MD;  Location: [facility] OR;  Service: ENT;  Laterality: N/A;   CORONARY ARTERY BYPASS GRAFT  09/2013   JOINT REPLACEMENT  02/19/23   PAROTIDECTOMY N/A 12/12/2023   Procedure: PAROTIDECTOMY;  Surgeon: Tobie Eldora NOVAK, MD;  Location: [facility] OR;  Service: ENT;  Laterality: N/A;   prolapsed  uterus surgery      TONSILLECTOMY     Removed as a child   TOTAL KNEE ARTHROPLASTY Right 02/19/2023   Procedure: TOTAL KNEE ARTHROPLASTY;  Surgeon: Yvone Rush, MD;  Location: [facility] ORS;  Service: Orthopedics;  Laterality: Right;   TUBAL LIGATION  1978    Social History   Socioeconomic History   Marital status: Widowed    Spouse name: Not on file   Number of children: 4   Years of education: 17   Highest education level: Bachelor's degree (e.g., BA, AB, BS)  Occupational History   Occupation: Retired   Occupation: Works part time    Comment: 3  mornings at the preschool   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 She sees her family every day. She also has a puppy. She has not been able to exercise to due her knees.    Social Drivers of Corporate investment banker Strain: Low Risk  (05/29/2024)   Overall Financial Resource Strain (CARDIA)    Difficulty of Paying Living Expenses: Not hard at all  Food Insecurity: No Food Insecurity (05/29/2024)   Hunger Vital Sign    Worried About Running Out of Food in the Last Year: Never true    Ran Out of Food in the Last Year: Never true  Transportation Needs: No Transportation Needs (05/29/2024)   PRAPARE - Administrator, Civil Service (Medical): No    Lack of Transportation (Non-Medical): No  Recent Concern: Transportation Needs - Unmet Transportation Needs (05/29/2024)   PRAPARE -  Transportation    Lack of Transportation (Medical): Yes    Lack of Transportation (Non-Medical): Yes  Physical Activity: Insufficiently Active (05/29/2024)   Exercise Vital Sign    Days of Exercise per Week: 7 days    Minutes of Exercise per Session: 20 min  Stress: No Stress Concern Present (05/29/2024)   [employer] of Occupational Health - Occupational Stress Questionnaire    Feeling of Stress: Not at all  Recent Concern: Stress - Stress Concern Present (05/29/2024)   [employer] of Occupational Health - Occupational Stress Questionnaire    Feeling of Stress: To some extent  Social Connections: Moderately Integrated (05/29/2024)   Social Connection and Isolation Panel    Frequency of Communication with Friends and Family: More than three times a week    Frequency of Social Gatherings with Friends and Family: Twice a week    Attends Religious Services: More than 4 times per year    Active Member of [employer] or Organizations: Yes    Attends Banker Meetings: More than 4 times per year    Marital Status: Widowed    Family History  Problem Relation Age of Onset   Hypertension Mother    Heart attack Mother    Birth defects Mother    Cancer Father    Penile cancer Father    Diabetes Sister    Varicose Veins Maternal Grandmother    Diabetes Brother    Varicose Veins Maternal Aunt     Health Maintenance  Topic Date Due   Hepatitis C Screening  Never done   COVID-19 Vaccine (3 - Pfizer risk series) 08/03/2024 (Originally 03/12/2020)   DEXA SCAN  10/07/2024 (Originally 01/19/2010)   Zoster Vaccines- Shingrix (1 of 2) 11/21/2024 (Originally 01/20/1964)   Pneumococcal Vaccine: 50+ Years (1 of 2 - PCV) 06/06/2025 (Originally 01/20/1964)   INFLUENZA VACCINE  06/13/2024   Medicare Annual Wellness (AWV)  05/29/2025   Hepatitis B Vaccines  Aged Out   HPV VACCINES  Aged Out   Meningococcal B Vaccine  Aged Out   DTaP/Tdap/Td  Discontinued      ----------------------------------------------------------------------------------------------------------------------------------------------------------------------------------------------------------------- Physical Exam BP 127/78 (BP Location: Left Arm, Patient Position: Sitting, Cuff Size: Normal)   Pulse 72   Ht 5' 7 (1.702 m)   Wt 184 lb (83.5 kg)   SpO2 97%   BMI 28.82 kg/m   Physical Exam Constitutional:      Appearance: Normal appearance.  Eyes:     General: No scleral icterus. Cardiovascular:     Rate and Rhythm: Normal rate and regular rhythm.  Pulmonary:     Effort: Pulmonary effort is normal.     Breath  sounds: Normal breath sounds.  Neurological:     General: No focal deficit present.     Mental Status: She is alert.  Psychiatric:        Mood and Affect: Mood normal.        Behavior: Behavior normal.     ------------------------------------------------------------------------------------------------------------------------------------------------------------------------------------------------------------------- Assessment and Plan  Hyperlipidemia Currently taking repatha, managed by cardiology.  Doing well at this time.   Essential hypertension Pressure stable.  She will continue amlodipine  and hydrochlorothiazide  at current strength.   No orders of the defined types were placed in this encounter.   No follow-ups on file.

## 2024-06-06 NOTE — Assessment & Plan Note (Signed)
Currently taking repatha, managed by cardiology.  Doing well at this time.

## 2024-06-06 NOTE — Assessment & Plan Note (Signed)
Pressure stable.  She will continue amlodipine and hydrochlorothiazide at current strength.

## 2024-07-15 ENCOUNTER — Encounter: Payer: Self-pay | Admitting: Sports Medicine

## 2024-08-20 ENCOUNTER — Other Ambulatory Visit: Payer: Self-pay | Admitting: Family Medicine

## 2024-08-20 DIAGNOSIS — I10 Essential (primary) hypertension: Secondary | ICD-10-CM

## 2024-08-27 DIAGNOSIS — R072 Precordial pain: Secondary | ICD-10-CM | POA: Diagnosis not present

## 2024-08-27 DIAGNOSIS — Z8679 Personal history of other diseases of the circulatory system: Secondary | ICD-10-CM | POA: Diagnosis not present

## 2024-08-27 DIAGNOSIS — I10 Essential (primary) hypertension: Secondary | ICD-10-CM | POA: Diagnosis not present

## 2024-08-27 DIAGNOSIS — Z9889 Other specified postprocedural states: Secondary | ICD-10-CM | POA: Diagnosis not present

## 2024-08-27 DIAGNOSIS — Z951 Presence of aortocoronary bypass graft: Secondary | ICD-10-CM | POA: Diagnosis not present

## 2024-08-27 DIAGNOSIS — Z952 Presence of prosthetic heart valve: Secondary | ICD-10-CM | POA: Diagnosis not present

## 2024-08-27 DIAGNOSIS — R0789 Other chest pain: Secondary | ICD-10-CM | POA: Insufficient documentation

## 2024-08-27 DIAGNOSIS — E782 Mixed hyperlipidemia: Secondary | ICD-10-CM | POA: Diagnosis not present

## 2024-08-27 DIAGNOSIS — I2510 Atherosclerotic heart disease of native coronary artery without angina pectoris: Secondary | ICD-10-CM | POA: Diagnosis not present

## 2024-09-10 DIAGNOSIS — Z951 Presence of aortocoronary bypass graft: Secondary | ICD-10-CM | POA: Diagnosis not present

## 2024-09-10 DIAGNOSIS — R9389 Abnormal findings on diagnostic imaging of other specified body structures: Secondary | ICD-10-CM | POA: Diagnosis not present

## 2024-09-10 DIAGNOSIS — I2510 Atherosclerotic heart disease of native coronary artery without angina pectoris: Secondary | ICD-10-CM | POA: Diagnosis not present

## 2024-09-10 DIAGNOSIS — R072 Precordial pain: Secondary | ICD-10-CM | POA: Diagnosis not present

## 2024-11-20 ENCOUNTER — Ambulatory Visit (INDEPENDENT_AMBULATORY_CARE_PROVIDER_SITE_OTHER): Admitting: Family Medicine

## 2024-11-20 ENCOUNTER — Encounter: Payer: Self-pay | Admitting: Family Medicine

## 2024-11-20 VITALS — BP 135/75 | HR 68 | Ht 67.0 in | Wt 182.0 lb

## 2024-11-20 DIAGNOSIS — I10 Essential (primary) hypertension: Secondary | ICD-10-CM | POA: Diagnosis not present

## 2024-11-20 DIAGNOSIS — R0789 Other chest pain: Secondary | ICD-10-CM | POA: Diagnosis not present

## 2024-11-20 DIAGNOSIS — Z0000 Encounter for general adult medical examination without abnormal findings: Secondary | ICD-10-CM | POA: Diagnosis not present

## 2024-11-20 DIAGNOSIS — E785 Hyperlipidemia, unspecified: Secondary | ICD-10-CM | POA: Diagnosis not present

## 2024-11-20 MED ORDER — AMLODIPINE BESYLATE 5 MG PO TABS: 5.0000 mg | ORAL_TABLET | Freq: Every day | ORAL | 3 refills | Status: AC

## 2024-11-20 MED ORDER — HYDROCHLOROTHIAZIDE 12.5 MG PO TABS: 12.5000 mg | ORAL_TABLET | Freq: Every day | ORAL | 3 refills | Status: AC

## 2024-11-20 NOTE — Assessment & Plan Note (Signed)
 Blood Pressure stable.  She will continue amlodipine  and hydrochlorothiazide  at current strength.

## 2024-11-20 NOTE — Assessment & Plan Note (Signed)
Currently taking repatha, managed by cardiology.  Doing well at this time.

## 2024-11-20 NOTE — Assessment & Plan Note (Addendum)
 She had mildly abnormal stress test.  Recommend that she reach back out to cardiology regarding these results especially with continued episodes of pain.

## 2024-11-20 NOTE — Progress Notes (Signed)
 " Alicia Herrera - 79 y.o. female MRN [id number]  Date of birth: 04-23-1946  Subjective Chief Complaint  Patient presents with   Chest Pain   Hypertension    HPI Alicia Herrera is a 79 y.o. female here today for follow up visit.   She reports that she is doing well.  She continues on amlodipine  and hydrochlorothiazide .  Doing well with current medications and BP is well controlled.  She does continues to follow with cardiology as well due to history of CAD and AVR.  She has had some intermittent chest pain.  Had nuclear stress test in October but never heard the results of this.  Test read as mildly abnormal. She never heard from cardiologist regarding results.  She denies shortness of breath, palpitations, headache or vision changes.  She remains on repatha for HLD.    ROS:  A comprehensive ROS was completed and negative except as noted per HPI  Allergies[1]  Past Medical History:  Diagnosis Date   Aortic stenosis due to bicuspid aortic valve 10/02/2013   s/p replacement of ascending TAA with Hemashield graft & AVR using 21 mm Magna Ease bioprosthesis ([facility])   Arthritis    CHF (congestive heart failure) (HCC) 2014   Coronary artery disease    s/pc combined 3V CABG/AVR 10/02/13   DVT (deep venous thrombosis) (HCC) 02/27/2023   post-TKA RLE DVT & PE 02/27/23, s/p 3 months Eliquis    Heart murmur    Hx of CABG    Hyperlipidemia 2014   Hypertension    Hypothyroidism    hx of 25 years ago per pt   Myocardial infarction [facility]) 2014   PE (pulmonary thromboembolism) (HCC) 02/27/2023   provoked   Rectocele    Uterine prolapse     Past Surgical History:  Procedure Laterality Date   CARDIAC CATHETERIZATION  08/27/2013   CARDIAC VALVE REPLACEMENT  09/2013   AVR   CONTINUOUS NERVE MONITORING N/A 12/12/2023   Procedure: CONTINUOUS NERVE MONITORING;  Surgeon: Tobie Eldora NOVAK, MD;  Location: [facility] OR;  Service: ENT;  Laterality: N/A;   CORONARY ARTERY BYPASS GRAFT  09/2013   JOINT  REPLACEMENT  02/19/23   PAROTIDECTOMY N/A 12/12/2023   Procedure: PAROTIDECTOMY;  Surgeon: Tobie Eldora NOVAK, MD;  Location: [facility] OR;  Service: ENT;  Laterality: N/A;   prolapsed uterus surgery      TONSILLECTOMY     Removed as a child   TOTAL KNEE ARTHROPLASTY Right 02/19/2023   Procedure: TOTAL KNEE ARTHROPLASTY;  Surgeon: Yvone Rush, MD;  Location: [facility] ORS;  Service: Orthopedics;  Laterality: Right;   TUBAL LIGATION  1978    Social History   Socioeconomic History   Marital status: Widowed    Spouse name: Not on file   Number of children: 4   Years of education: 17   Highest education level: Bachelor's degree (e.g., BA, AB, BS)  Occupational History   Occupation: Retired   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 She sees her family every day. She also has a puppy. She has not been able to exercise to due her knees.    Social Drivers of Health   Tobacco Use: Low Risk (08/27/2024)   Received from [facility]   Patient History    Smoking Tobacco Use: Never    Smokeless Tobacco Use: Never    Passive Exposure: Not on file  Financial Resource Strain: Low Risk (11/17/2024)   Overall Financial Resource Strain (CARDIA)    Difficulty of Paying Living Expenses: Not very hard  Food Insecurity: No Food Insecurity (11/17/2024)   Epic    Worried About Radiation Protection Practitioner of Food in the Last  Year: Never true    Ran Out of Food in the Last Year: Never true  Transportation Needs: No Transportation Needs (11/17/2024)   Epic    Lack of Transportation (Medical): No    Lack of Transportation (Non-Medical): No  Physical Activity: Insufficiently Active (11/17/2024)   Exercise Vital Sign    Days of Exercise per Week: 1 day    Minutes of Exercise per Session: 40 min  Stress: No Stress Concern Present (11/17/2024)   [employer] of Occupational Health - Occupational Stress Questionnaire    Feeling of Stress: Only a little  Social Connections: Moderately Integrated (11/17/2024)   Social Connection and Isolation Panel    Frequency of Communication with Friends and Family: Three times a week    Frequency of Social Gatherings with Friends and Family: Three times a week    Attends Religious Services: More than 4 times per year    Active Member of Clubs or Organizations: Yes    Attends Banker Meetings: More than 4 times per year    Marital Status: Widowed  Depression (PHQ2-9): Low Risk (11/20/2024)   Depression (PHQ2-9)    PHQ-2 Score: 1  Alcohol Screen: Low Risk (05/29/2024)   Alcohol Screen    Last Alcohol Screening Score (AUDIT): 0  Housing: Unknown (11/17/2024)   Epic    Unable to Pay for Housing in the Last Year: No    Number of Times Moved in the Last Year: 0    Homeless in the Last Year: Not on file  Utilities: Not At Risk (05/29/2024)   Epic    Threatened with loss of utilities: No  Health Literacy: Adequate Health Literacy (05/29/2024)   B1300 Health Literacy    Frequency of need for help with medical instructions: Never    Family History  Problem Relation Age of Onset   Hypertension Mother    Heart attack Mother    Birth defects Mother    Cancer Father    Penile cancer Father    Diabetes Sister    Varicose Veins Maternal Grandmother    Diabetes Brother    Varicose Veins Maternal Aunt     Health Maintenance  Topic Date Due   Hepatitis C Screening  Never  done   Bone Density Scan  Never done   Zoster Vaccines- Shingrix (1 of 2) 11/21/2024 (Originally 01/20/1964)   COVID-19 Vaccine (3 - Pfizer risk series) 12/06/2024 (Originally 03/12/2020)   Influenza Vaccine  02/10/2025 (Originally 06/13/2024)   Pneumococcal Vaccine: 50+ Years (1 of 2 - PCV) 06/06/2025 (Originally 01/20/1964)   Medicare Annual Wellness (AWV)  05/29/2025   Meningococcal B Vaccine  Aged Out   DTaP/Tdap/Td  Discontinued     ----------------------------------------------------------------------------------------------------------------------------------------------------------------------------------------------------------------- Physical Exam BP 135/75 (BP Location: Left Arm, Patient Position: Sitting, Cuff Size: Large)   Pulse 68   Ht 5' 7 (1.702 m)   Wt 182 lb (82.6 kg)   SpO2 98%   BMI 28.51 kg/m   Physical Exam Constitutional:      Appearance: Normal appearance.  HENT:     Head: Normocephalic and atraumatic.  Eyes:     General: No scleral icterus. Cardiovascular:     Rate and Rhythm: Normal rate and regular rhythm.  Pulmonary:     Effort: Pulmonary effort is normal.     Breath sounds: Normal breath sounds.  Musculoskeletal:     Cervical back: Neck supple.  Neurological:     Mental Status: She is alert.  Psychiatric:        Mood and Affect: Mood normal.        Behavior: Behavior normal.     ------------------------------------------------------------------------------------------------------------------------------------------------------------------------------------------------------------------- Assessment and Plan  Hyperlipidemia Currently taking repatha, managed by cardiology.  Doing well at this time.   Essential hypertension Blood Pressure stable.  She will continue amlodipine  and hydrochlorothiazide  at current strength.  Chest discomfort She had mildly abnormal stress test.  Recommend that she reach back out to cardiology regarding these  results especially with continued episodes of pain.      No orders of the defined types were placed in this encounter.   No follow-ups on file.        [1] No Known Allergies  "

## 2024-11-21 LAB — CMP14+EGFR
ALT: 11 IU/L (ref 0–32)
AST: 16 IU/L (ref 0–40)
Albumin: 4.4 g/dL (ref 3.8–4.8)
Alkaline Phosphatase: 68 IU/L (ref 49–135)
BUN/Creatinine Ratio: 16 (ref 12–28)
BUN: 15 mg/dL (ref 8–27)
Bilirubin Total: 0.3 mg/dL (ref 0.0–1.2)
CO2: 21 mmol/L (ref 20–29)
Calcium: 9.5 mg/dL (ref 8.7–10.3)
Chloride: 104 mmol/L (ref 96–106)
Creatinine, Ser: 0.93 mg/dL (ref 0.57–1.00)
Globulin, Total: 2.2 g/dL (ref 1.5–4.5)
Glucose: 98 mg/dL (ref 70–99)
Potassium: 4.4 mmol/L (ref 3.5–5.2)
Sodium: 143 mmol/L (ref 134–144)
Total Protein: 6.6 g/dL (ref 6.0–8.5)
eGFR: 63 mL/min/1.73

## 2024-11-21 LAB — CBC WITH DIFFERENTIAL/PLATELET
Basophils Absolute: 0 x10E3/uL (ref 0.0–0.2)
Basos: 1 %
EOS (ABSOLUTE): 0.2 x10E3/uL (ref 0.0–0.4)
Eos: 3 %
Hematocrit: 39.3 % (ref 34.0–46.6)
Hemoglobin: 12.6 g/dL (ref 11.1–15.9)
Immature Grans (Abs): 0 x10E3/uL (ref 0.0–0.1)
Immature Granulocytes: 0 %
Lymphocytes Absolute: 1.3 x10E3/uL (ref 0.7–3.1)
Lymphs: 27 %
MCH: 27.8 pg (ref 26.6–33.0)
MCHC: 32.1 g/dL (ref 31.5–35.7)
MCV: 87 fL (ref 79–97)
Monocytes Absolute: 0.4 x10E3/uL (ref 0.1–0.9)
Monocytes: 8 %
Neutrophils Absolute: 2.8 x10E3/uL (ref 1.4–7.0)
Neutrophils: 61 %
Platelets: 257 x10E3/uL (ref 150–450)
RBC: 4.53 x10E6/uL (ref 3.77–5.28)
RDW: 13.9 % (ref 11.7–15.4)
WBC: 4.7 x10E3/uL (ref 3.4–10.8)

## 2024-11-21 LAB — LIPID PANEL WITH LDL/HDL RATIO
Cholesterol, Total: 154 mg/dL (ref 100–199)
HDL: 49 mg/dL
LDL Chol Calc (NIH): 86 mg/dL (ref 0–99)
LDL/HDL Ratio: 1.8 ratio (ref 0.0–3.2)
Triglycerides: 106 mg/dL (ref 0–149)
VLDL Cholesterol Cal: 19 mg/dL (ref 5–40)

## 2024-11-30 ENCOUNTER — Ambulatory Visit: Payer: Self-pay | Admitting: Family Medicine

## 2025-05-20 ENCOUNTER — Ambulatory Visit: Admitting: Family Medicine

## 2025-06-02 ENCOUNTER — Ambulatory Visit
